# Patient Record
Sex: Female | Born: 1940 | Hispanic: No | Marital: Married | Smoking: Never smoker
Health system: Southern US, Community
[De-identification: ages and names within clinical notes are randomized; demographics above are authoritative.]

## PROBLEM LIST (undated history)

## (undated) DIAGNOSIS — M1990 Unspecified osteoarthritis, unspecified site: Secondary | ICD-10-CM

## (undated) DIAGNOSIS — A048 Other specified bacterial intestinal infections: Secondary | ICD-10-CM

## (undated) DIAGNOSIS — I10 Essential (primary) hypertension: Secondary | ICD-10-CM

## (undated) HISTORY — DX: Other specified bacterial intestinal infections: A04.8

## (undated) HISTORY — DX: Essential (primary) hypertension: I10

## (undated) HISTORY — DX: Unspecified osteoarthritis, unspecified site: M19.90

## (undated) HISTORY — PX: TUBAL LIGATION: SHX77

---

## 1999-09-23 ENCOUNTER — Other Ambulatory Visit: Admission: RE | Admit: 1999-09-23 | Discharge: 1999-09-23 | Payer: Self-pay | Admitting: *Deleted

## 2000-05-26 ENCOUNTER — Other Ambulatory Visit: Admission: RE | Admit: 2000-05-26 | Discharge: 2000-05-26 | Payer: Self-pay | Admitting: *Deleted

## 2000-05-28 ENCOUNTER — Encounter: Payer: Self-pay | Admitting: *Deleted

## 2000-05-28 ENCOUNTER — Encounter: Admission: RE | Admit: 2000-05-28 | Discharge: 2000-05-28 | Payer: Self-pay | Admitting: *Deleted

## 2004-04-04 ENCOUNTER — Other Ambulatory Visit: Admission: RE | Admit: 2004-04-04 | Discharge: 2004-04-04 | Payer: Self-pay | Admitting: Obstetrics and Gynecology

## 2004-10-18 ENCOUNTER — Ambulatory Visit: Payer: Self-pay | Admitting: Gastroenterology

## 2004-11-05 ENCOUNTER — Ambulatory Visit (HOSPITAL_COMMUNITY): Admission: RE | Admit: 2004-11-05 | Discharge: 2004-11-05 | Payer: Self-pay | Admitting: Gastroenterology

## 2004-11-14 ENCOUNTER — Ambulatory Visit: Payer: Self-pay | Admitting: Gastroenterology

## 2007-07-26 ENCOUNTER — Ambulatory Visit: Payer: Self-pay | Admitting: Family Medicine

## 2007-07-26 DIAGNOSIS — R079 Chest pain, unspecified: Secondary | ICD-10-CM | POA: Insufficient documentation

## 2007-07-26 DIAGNOSIS — N951 Menopausal and female climacteric states: Secondary | ICD-10-CM | POA: Insufficient documentation

## 2007-08-09 ENCOUNTER — Ambulatory Visit: Payer: Self-pay | Admitting: Family Medicine

## 2007-08-12 ENCOUNTER — Encounter: Payer: Self-pay | Admitting: Family Medicine

## 2007-08-12 ENCOUNTER — Encounter: Admission: RE | Admit: 2007-08-12 | Discharge: 2007-08-12 | Payer: Self-pay | Admitting: Family Medicine

## 2007-08-18 ENCOUNTER — Encounter (INDEPENDENT_AMBULATORY_CARE_PROVIDER_SITE_OTHER): Payer: Self-pay | Admitting: *Deleted

## 2007-08-20 ENCOUNTER — Encounter (INDEPENDENT_AMBULATORY_CARE_PROVIDER_SITE_OTHER): Payer: Self-pay | Admitting: *Deleted

## 2008-01-17 ENCOUNTER — Ambulatory Visit: Payer: Self-pay | Admitting: Internal Medicine

## 2008-01-17 DIAGNOSIS — T887XXA Unspecified adverse effect of drug or medicament, initial encounter: Secondary | ICD-10-CM | POA: Insufficient documentation

## 2008-01-18 ENCOUNTER — Telehealth: Payer: Self-pay | Admitting: Internal Medicine

## 2008-01-20 ENCOUNTER — Ambulatory Visit: Payer: Self-pay | Admitting: Family Medicine

## 2008-01-20 DIAGNOSIS — Z87898 Personal history of other specified conditions: Secondary | ICD-10-CM

## 2008-01-21 ENCOUNTER — Telehealth (INDEPENDENT_AMBULATORY_CARE_PROVIDER_SITE_OTHER): Payer: Self-pay | Admitting: *Deleted

## 2008-01-25 ENCOUNTER — Ambulatory Visit: Payer: Self-pay | Admitting: Family Medicine

## 2008-01-25 ENCOUNTER — Telehealth (INDEPENDENT_AMBULATORY_CARE_PROVIDER_SITE_OTHER): Payer: Self-pay | Admitting: *Deleted

## 2008-01-25 ENCOUNTER — Encounter (INDEPENDENT_AMBULATORY_CARE_PROVIDER_SITE_OTHER): Payer: Self-pay | Admitting: *Deleted

## 2008-01-25 LAB — CONVERTED CEMR LAB: OCCULT 1: NEGATIVE

## 2008-01-26 ENCOUNTER — Telehealth (INDEPENDENT_AMBULATORY_CARE_PROVIDER_SITE_OTHER): Payer: Self-pay | Admitting: *Deleted

## 2008-01-27 ENCOUNTER — Telehealth (INDEPENDENT_AMBULATORY_CARE_PROVIDER_SITE_OTHER): Payer: Self-pay | Admitting: *Deleted

## 2008-02-07 ENCOUNTER — Ambulatory Visit: Payer: Self-pay | Admitting: Family Medicine

## 2008-02-07 ENCOUNTER — Telehealth (INDEPENDENT_AMBULATORY_CARE_PROVIDER_SITE_OTHER): Payer: Self-pay | Admitting: *Deleted

## 2008-11-09 ENCOUNTER — Ambulatory Visit: Payer: Self-pay | Admitting: Family Medicine

## 2008-11-09 DIAGNOSIS — R1310 Dysphagia, unspecified: Secondary | ICD-10-CM | POA: Insufficient documentation

## 2008-11-09 DIAGNOSIS — H6690 Otitis media, unspecified, unspecified ear: Secondary | ICD-10-CM | POA: Insufficient documentation

## 2008-11-23 ENCOUNTER — Ambulatory Visit: Payer: Self-pay | Admitting: Gastroenterology

## 2008-11-23 DIAGNOSIS — M818 Other osteoporosis without current pathological fracture: Secondary | ICD-10-CM

## 2008-11-23 DIAGNOSIS — K219 Gastro-esophageal reflux disease without esophagitis: Secondary | ICD-10-CM

## 2008-12-13 ENCOUNTER — Ambulatory Visit: Payer: Self-pay | Admitting: Gastroenterology

## 2008-12-13 ENCOUNTER — Encounter: Payer: Self-pay | Admitting: Gastroenterology

## 2008-12-15 ENCOUNTER — Encounter: Payer: Self-pay | Admitting: Gastroenterology

## 2009-03-22 ENCOUNTER — Ambulatory Visit: Payer: Self-pay | Admitting: Family Medicine

## 2009-03-22 DIAGNOSIS — L255 Unspecified contact dermatitis due to plants, except food: Secondary | ICD-10-CM

## 2009-11-19 ENCOUNTER — Ambulatory Visit: Payer: Self-pay | Admitting: Family Medicine

## 2009-11-19 DIAGNOSIS — M810 Age-related osteoporosis without current pathological fracture: Secondary | ICD-10-CM | POA: Insufficient documentation

## 2009-11-19 DIAGNOSIS — G2581 Restless legs syndrome: Secondary | ICD-10-CM

## 2009-11-26 ENCOUNTER — Encounter: Admission: RE | Admit: 2009-11-26 | Discharge: 2009-11-26 | Payer: Self-pay | Admitting: Family Medicine

## 2009-11-26 ENCOUNTER — Encounter: Payer: Self-pay | Admitting: Family Medicine

## 2009-11-28 ENCOUNTER — Telehealth: Payer: Self-pay | Admitting: Family Medicine

## 2009-12-06 ENCOUNTER — Encounter (INDEPENDENT_AMBULATORY_CARE_PROVIDER_SITE_OTHER): Payer: Self-pay | Admitting: *Deleted

## 2009-12-12 ENCOUNTER — Encounter (INDEPENDENT_AMBULATORY_CARE_PROVIDER_SITE_OTHER): Payer: Self-pay | Admitting: *Deleted

## 2009-12-12 ENCOUNTER — Ambulatory Visit: Payer: Self-pay | Admitting: Family Medicine

## 2009-12-12 LAB — CONVERTED CEMR LAB
OCCULT 1: NEGATIVE
OCCULT 2: NEGATIVE

## 2010-02-21 ENCOUNTER — Ambulatory Visit: Payer: Self-pay | Admitting: Family Medicine

## 2010-03-05 ENCOUNTER — Telehealth (INDEPENDENT_AMBULATORY_CARE_PROVIDER_SITE_OTHER): Payer: Self-pay | Admitting: *Deleted

## 2010-07-17 ENCOUNTER — Ambulatory Visit: Payer: Self-pay | Admitting: Internal Medicine

## 2010-10-07 ENCOUNTER — Ambulatory Visit: Payer: Self-pay | Admitting: Family Medicine

## 2010-10-07 DIAGNOSIS — L258 Unspecified contact dermatitis due to other agents: Secondary | ICD-10-CM

## 2010-10-07 DIAGNOSIS — S139XXA Sprain of joints and ligaments of unspecified parts of neck, initial encounter: Secondary | ICD-10-CM

## 2010-12-01 LAB — CONVERTED CEMR LAB
ALT: 21 units/L (ref 0–35)
AST: 30 units/L (ref 0–37)
Albumin: 4.6 g/dL (ref 3.5–5.2)
Albumin: 4.7 g/dL (ref 3.5–5.2)
Alkaline Phosphatase: 50 units/L (ref 39–117)
Alkaline Phosphatase: 62 units/L (ref 39–117)
BUN: 14 mg/dL (ref 6–23)
Basophils Absolute: 0 10*3/uL (ref 0.0–0.1)
Basophils Relative: 0.3 % (ref 0.0–3.0)
Basophils Relative: 0.9 % (ref 0.0–1.0)
Bilirubin, Direct: 0 mg/dL (ref 0.0–0.3)
Bilirubin, Direct: 0.2 mg/dL (ref 0.0–0.3)
CO2: 28 meq/L (ref 19–32)
Calcium: 9.5 mg/dL (ref 8.4–10.5)
Chloride: 106 meq/L (ref 96–112)
Cholesterol: 214 mg/dL (ref 0–200)
Cholesterol: 224 mg/dL — ABNORMAL HIGH (ref 0–200)
Creatinine, Ser: 0.7 mg/dL (ref 0.4–1.2)
Creatinine, Ser: 0.8 mg/dL (ref 0.4–1.2)
Direct LDL: 144.1 mg/dL
GFR calc non Af Amer: 88.26 mL/min (ref >60)
Glucose, Bld: 79 mg/dL (ref 70–99)
Glucose, Bld: 89 mg/dL (ref 70–99)
HDL: 43.8 mg/dL (ref >39.0)
HDL: 45.6 mg/dL (ref >39.00)
Hemoglobin: 13.1 g/dL (ref 12.0–15.0)
Lymphocytes Relative: 30.4 % (ref 12.0–46.0)
Lymphs Abs: 1.8 10*3/uL (ref 0.7–4.0)
MCHC: 35.2 g/dL (ref 30.0–36.0)
Monocytes Absolute: 0.4 10*3/uL (ref 0.1–1.0)
Monocytes Relative: 6.9 % (ref 3.0–12.0)
Neutro Abs: 3.6 10*3/uL (ref 1.4–7.7)
Neutrophils Relative %: 61.1 % (ref 43.0–77.0)
Potassium: 3.9 meq/L (ref 3.5–5.1)
Sodium: 144 meq/L (ref 135–145)
Total Bilirubin: 1.3 mg/dL — ABNORMAL HIGH (ref 0.3–1.2)
Total CHOL/HDL Ratio: 4.9
Total Protein: 7.9 g/dL (ref 6.0–8.3)
Triglycerides: 131 mg/dL (ref 0–149)
VLDL: 32.6 mg/dL (ref 0.0–40.0)
Vit D, 25-Hydroxy: 23 ng/mL — ABNORMAL LOW (ref 30–89)
WBC: 5.9 10*3/uL (ref 4.5–10.5)

## 2010-12-05 NOTE — Progress Notes (Signed)
Summary: Bone Density Results (lmom 1/26,1/27,2/2)  Phone Note Outgoing Call   Call placed by: Army Fossa CMA,  November 28, 2009 10:34 AM Summary of Call: Regarging bone density, LMTCB:  + osteopenia---  if pt is taking calcium 1500 mg daily with vita D3 2000u daily----we need to add fosamax 70 mg weekly #4  11 refills ---recheck 2 weeks---she was on this before and stopped it.  I'm not sure why. Signed by Loreen Freud Mckeel on 11/26/2009 at 5:07 PM  Follow-up for Phone Call        [facility]. Army Fossa CMA  November 29, 2009 4:22 PM   Additional Follow-up for Phone Call Additional follow up Details #1::        LMTCB. Army Fossa CMA  December 05, 2009 1:16 PM      Appended Document: Bone Density Results (lmom 1/26,1/27,2/2) mailed pt a letter to contact the office

## 2010-12-05 NOTE — Assessment & Plan Note (Signed)
Summary: FOR NECK PAIN FOR WEEKS//PH   Vital Signs:  Patient profile:   70 year old female Height:      59.75 inches Weight:      126.6 pounds BMI:     25.02 Temp:     99.2 degrees F oral BP sitting:   130 / 78  (right arm) Cuff size:   regular  Vitals Entered By: Almeta Monas CMA Duncan Dull) (October 07, 2010 2:27 PM) CC: x1week pt had neck pain-- then she used an icy hot patch which gave her a rash on the neck   Current Medications (verified): 1)  Zyrtec Allergy 10 Mg  Tabs (Cetirizine Hcl) .... Once Daily As Needed For Allergies and Itching 2)  Tylenol 325 Mg Tabs (Acetaminophen) 3)  Caltrate 600+d Plus 600-400 Mg-Unit Tabs (Calcium Carbonate-Vit D-Min) .Marland Kitchen.. 1 By Mouth Two Times A Day 4)  Lidoderm 5 % Ptch (Lidocaine) .... Apply To Affected Area As Needed  Allergies (verified): 1)  ! Ultram Er (Tramadol Hcl)  Physical Exam  General:  Well-developed,well-nourished,in no acute distress; alert,appropriate and cooperative throughout examination Neck:  + tenderness L side neck with pain with rotation to Left + papular rash L side of neck where patch was Lungs:  Normal respiratory effort, chest expands symmetrically. Lungs are clear to auscultation, no crackles or wheezes. Heart:  normal rate and no murmur.   Psych:  Cognition and judgment appear intact. Alert and cooperative with normal attention span and concentration. No apparent delusions, illusions, hallucinations   Impression & Recommendations:  Problem # 1:  CONTACT DERMATITIS&OTH ECZEMA DUE OTH SPEC AGENT (ICD-692.89)  Her updated medication list for this problem includes:    Zyrtec Allergy 10 Mg Tabs (Cetirizine hcl) ..... Once daily as needed for allergies and itching    Elocon 0.1 % Crea (Mometasone furoate) .Marland Kitchen... Apply once daily  Discussed avoidance of triggers and symptomatic treatment.   Problem # 2:  NECK SPRAIN AND STRAIN (ICD-847.0)  Her updated medication list for this problem includes:    Tylenol 325 Mg  Tabs (Acetaminophen)    Flexeril 10 Mg Tabs (Cyclobenzaprine hcl) .Marland Kitchen... 1 by mouth three times a day as needed    Mobic 15 Mg Tabs (Meloxicam) .Marland Kitchen... 1/2 -1 by mouth once daily as needed pain  Discussed exercises and use of moist heat or cold and medication.   Complete Medication List: 1)  Zyrtec Allergy 10 Mg Tabs (Cetirizine hcl) .... Once daily as needed for allergies and itching 2)  Tylenol 325 Mg Tabs (Acetaminophen) 3)  Caltrate 600+d Plus 600-400 Mg-unit Tabs (Calcium carbonate-vit d-min) .Marland Kitchen.. 1 by mouth two times a day 4)  Lidoderm 5 % Ptch (Lidocaine) .... Apply to affected area as needed 5)  Flexeril 10 Mg Tabs (Cyclobenzaprine hcl) .Marland Kitchen.. 1 by mouth three times a day as needed 6)  Elocon 0.1 % Crea (Mometasone furoate) .... Apply once daily 7)  Mobic 15 Mg Tabs (Meloxicam) .... 1/2 -1 by mouth once daily as needed pain Prescriptions: MOBIC 15 MG TABS (MELOXICAM) 1/2 -1 by mouth once daily as needed pain  #30 x 0   Entered and Authorized by:   Loreen Freud Leavell   Signed by:   Loreen Freud Selph on 10/07/2010   Method used:   Electronically to        [id number]* (retail)       [address]       [place], [place]  [address]  Ph: [phone]       Fax: [phone]   RxID:   [id number] ELOCON 0.1 % CREA (MOMETASONE FUROATE) apply once daily  #30 g x 1   Entered and Authorized by:   Loreen Freud Weigelt   Signed by:   Loreen Freud Curington on 10/07/2010   Method used:   Electronically to        [facility] #[id number]* (retail)       [address] [place], [place]  [address]       Ph: [phone]       Fax: [phone]   RxID:   [id number] FLEXERIL 10 MG TABS (CYCLOBENZAPRINE HCL) 1 by mouth three times a day as needed  #30 x 0   Entered and Authorized by:   Loreen Freud Zalar   Signed by:   Loreen Freud Costanza on 10/07/2010   Method used:   Electronically to        [facility] #[id number]* (retail)       [address] [place],  [place]  [address]       Ph: [phone]       Fax: [phone]   RxID:   [id number]    Orders Added: 1)  Est. Patient Level III [92426]

## 2010-12-05 NOTE — Progress Notes (Signed)
Summary: Lab Results/Appt   Phone Note Outgoing Call   Call placed by: Army Fossa CMA,  Mar 05, 2010 11:09 AM Summary of Call: Regarding lab results, LMTCB:  pt will need medication--  should schedule ov to discuss results Signed by Loreen Freud Bear on 03/05/2010 at 10:42 AM  Follow-up for Phone Call        [facility]. Army Fossa CMA  Mar 06, 2010 11:39 AM   Additional Follow-up for Phone Call Additional follow up Details #1::        LMTCB.     Additional Follow-up for Phone Call Additional follow up Details #2::    Spoke with pts translater, she is going to tell Ms.Phoebe Perch CMA  Mar 07, 2010 10:13 AM

## 2010-12-05 NOTE — Assessment & Plan Note (Signed)
Summary: yearly ck.cbs  --n/s   Vital Signs:  Patient profile:   70 year old female Height:      59.75 inches Weight:      128.8 pounds BMI:     25.46 Pulse rate:   70 / minute Pulse rhythm:   regular BP sitting:   118 / 80  (left arm)  Vitals Entered By: Shary Decamp (November 19, 2009 8:31 AM) CC: CPX- no pap smear.    History of Present Illness: Pt here for cpe.  No pap.  + labs ----pt c/o burning with urination.   She also c/o restless legs at night.   No other complaints.    Preventive Screening-Counseling & Management  Alcohol-Tobacco     Alcohol drinks/day: 0     Smoking Status: never     Passive Smoke Exposure: no  Caffeine-Diet-Exercise     Caffeine use/day: < 1     Does Patient Exercise: no     Type of exercise: walk     Times/week: 4     Exercise Counseling: will start when weather warm  Hep-HIV-STD-Contraception     Dental Visit-last 6 months no      Sexual History:  currently monogamous and married.    Current Medications (verified): 1)  Aciphex 20 Mg  Tbec (Rabeprazole Sodium) .... Take 1 Each Day 30 Minutes Before Meals 2)  Zyrtec Allergy 10 Mg  Tabs (Cetirizine Hcl) .... Once Daily As Needed For Allergies and Itching 3)  Tylenol 325 Mg Tabs (Acetaminophen) 4)  Caltrate 600+d Plus 600-400 Mg-Unit Tabs (Calcium Carbonate-Vit D-Min) .Marland Kitchen.. 1 By Mouth Two Times A Day 5)  Zostavax 82956 Unt/0.62ml Solr (Zoster Vaccine Live) .Marland Kitchen.. 1 Im X1 6)  Requip 0.25 Mg Tabs (Ropinirole Hcl) .Marland Kitchen.. 1 By Mouth At Bedtime For 3 Nights Then 2 By Mouth At Bedtime  Allergies: 1)  ! Ultram Er (Tramadol Hcl)  Past History:  Past medical, surgical, family and social histories (including risk factors) reviewed for relevance to current acute and chronic problems.  Past Medical History: Reviewed history from 07/26/2007 and no changes required. Unremarkable  Past Surgical History: Reviewed history from 11/23/2008 and no changes required.  Tubal Ligation  Family  History: Reviewed history from 07/26/2007 and no changes required. Family History of Asthma: BROTHER  Social History: Reviewed history from 07/26/2007 and no changes required. Married Never Smoked Alcohol use-no Drug use-no [place]-- here 30 years- lives with husband Does Patient Exercise:  no Caffeine use/day:  < 1 Dental Care w/in 6 mos.:  no Sexual History:  currently monogamous, married  Review of Systems      See HPI General:  Denies chills, fatigue, fever, loss of appetite, malaise, sleep disorder, sweats, weakness, and weight loss. Eyes:  Denies blurring, discharge, double vision, eye irritation, eye pain, halos, itching, light sensitivity, red eye, vision loss-1 eye, and vision loss-both eyes; optho--last 02/2009. ENT:  Denies decreased hearing, difficulty swallowing, ear discharge, earache, hoarseness, nasal congestion, nosebleeds, postnasal drainage, ringing in ears, sinus pressure, and sore throat. CV:  Denies bluish discoloration of lips or nails, chest pain or discomfort, difficulty breathing at night, difficulty breathing while lying down, fainting, fatigue, leg cramps with exertion, lightheadness, near fainting, palpitations, shortness of breath with exertion, swelling of feet, swelling of hands, and weight gain. Resp:  Denies chest discomfort, chest pain with inspiration, cough, coughing up blood, excessive snoring, hypersomnolence, morning headaches, pleuritic, shortness of breath, sputum productive, and wheezing. GI:  Denies abdominal pain, bloody stools, change in  bowel habits, constipation, dark tarry stools, diarrhea, excessive appetite, gas, hemorrhoids, indigestion, and loss of appetite. GU:  Denies abnormal vaginal bleeding, decreased libido, discharge, dysuria, hematuria, incontinence, nocturia, urinary frequency, and urinary hesitancy. MS:  Denies joint pain, joint redness, joint swelling, loss of strength, low back pain, mid back pain, muscle aches, muscle , cramps,  muscle weakness, stiffness, and thoracic pain; restless legs. Derm:  Denies changes in color of skin, changes in nail beds, dryness, excessive perspiration, flushing, hair loss, insect bite(s), itching, lesion(s), poor wound healing, and rash. Neuro:  Denies brief paralysis, difficulty with concentration, disturbances in coordination, falling down, headaches, inability to speak, memory loss, numbness, poor balance, seizures, sensation of room spinning, tingling, tremors, visual disturbances, and weakness. Psych:  Denies alternate hallucination ( auditory/visual), anxiety, depression, easily angered, easily tearful, irritability, mental problems, panic attacks, sense of great danger, suicidal thoughts/plans, thoughts of violence, unusual visions or sounds, and thoughts /plans of harming others. Endo:  Denies cold intolerance, excessive hunger, excessive thirst, excessive urination, heat intolerance, polyuria, and weight change. Heme:  Denies abnormal bruising, bleeding, enlarge lymph nodes, fevers, pallor, and skin discoloration. Allergy:  Denies hives or rash, itching eyes, persistent infections, seasonal allergies, and sneezing.  Physical Exam  General:  Well-developed,well-nourished,in no acute distress; alert,appropriate and cooperative throughout examination Head:  Normocephalic and atraumatic without obvious abnormalities. No apparent alopecia or balding. Eyes:  pupils equal, pupils round, pupils reactive to light, and no injection.   Ears:  External ear exam shows no significant lesions or deformities.  Otoscopic examination reveals clear canals, tympanic membranes are intact bilaterally without bulging, retraction, inflammation or discharge. Hearing is grossly normal bilaterally. Nose:  External nasal examination shows no deformity or inflammation. Nasal mucosa are pink and moist without lesions or exudates. Mouth:  Oral mucosa and oropharynx without lesions or exudates.  Teeth in good  repair. Neck:  No deformities, masses, or tenderness noted.no carotid bruits.   Chest Wall:  No deformities, masses, or tenderness noted. Breasts:  No mass, nodules, thickening, tenderness, bulging, retraction, inflamation, nipple discharge or skin changes noted.   Lungs:  Normal respiratory effort, chest expands symmetrically. Lungs are clear to auscultation, no crackles or wheezes. Heart:  normal rate and no murmur.   Abdomen:  Bowel sounds positive,abdomen soft and non-tender without masses, organomegaly or hernias noted. Rectal:  refused Genitalia:  refused Msk:  normal ROM, no joint tenderness, no joint swelling, no joint warmth, no redness over joints, no joint deformities, no joint instability, and no crepitation.   Pulses:  R posterior tibial normal, R dorsalis pedis normal, R carotid normal, L posterior tibial normal, L dorsalis pedis normal, and L carotid normal.   Extremities:  No clubbing, cyanosis, edema, or deformity noted with normal full range of motion of all joints.   Neurologic:  No cranial nerve deficits noted. Station and gait are normal. Plantar reflexes are down-going bilaterally. DTRs are symmetrical throughout. Sensory, motor and coordinative functions appear intact. Skin:  Intact without suspicious lesions or rashes Cervical Nodes:  No lymphadenopathy noted Axillary Nodes:  No palpable lymphadenopathy Psych:  Oriented X3, normally interactive, good eye contact, not anxious appearing, and not depressed appearing.     Impression & Recommendations:  Problem # 1:  POSTMENOPAUSAL STATUS (ICD-627.2)  Orders: Venipuncture ([address]) TLB-Lipid Panel (80061-LIPID) TLB-BMP (Basic Metabolic Panel-BMET) (80048-METABOL) TLB-CBC Platelet - w/Differential (85025-CBCD) TLB-Hepatic/Liver Function Pnl (80076-HEPATIC) T-Vitamin D (25-Hydroxy) ([address]-09811) Radiology Referral (Radiology) EKG w/ Interpretation ([address])  Discussed treatment options.   Problem # 2:  GERD  (ICD-530.81) Assessment: Unchanged  Her updated medication list for this problem includes:    Aciphex 20 Mg Tbec (Rabeprazole sodium) .Marland Kitchen... Take 1 each day 30 minutes before meals  Orders: Venipuncture ([address]) TLB-Lipid Panel (80061-LIPID) TLB-BMP (Basic Metabolic Panel-BMET) (80048-METABOL) TLB-CBC Platelet - w/Differential (85025-CBCD) TLB-Hepatic/Liver Function Pnl (80076-HEPATIC) T-Vitamin D (25-Hydroxy) ([address]8119-14782) EKG w/ Interpretation ([address])  Diagnostics Reviewed:  EGD: Location: [facility]   (12/13/2008) Discussed lifestyle modifications, diet, antacids/medications, and preventive measures. Handout provided.   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 1 by mouth once weekly Her updated medication list for this problem includes:    Caltrate 600+d Plus 600-400 Mg-unit Tabs (Calcium carbonate-vit d-min) .Marland Kitchen... 1 by mouth two times a day  Bone Density: osteoporosis (09/15/2007)  Orders: EKG w/ Interpretation ([address])  Problem # 5:  RESTLESS LEG SYNDROME (ICD-333.94)  requip at bedtime  check labs  Orders: EKG w/ Interpretation ([address])  Complete Medication List: 1)  Aciphex 20 Mg Tbec (Rabeprazole sodium) .... Take 1 each day 30 minutes before meals 2)  Zyrtec Allergy 10 Mg Tabs (Cetirizine hcl) .... Once daily as needed for allergies and itching 3)  Tylenol 325 Mg Tabs (Acetaminophen) 4)  Caltrate 600+d Plus 600-400 Mg-unit Tabs (Calcium carbonate-vit d-min) .Marland Kitchen.. 1 by mouth  two times a day 5)  Zostavax 62952 Unt/0.84ml Solr (Zoster vaccine live) .Marland Kitchen.. 1 im x1 6)  Requip 0.25 Mg Tabs (Ropinirole hcl) .Marland Kitchen.. 1 by mouth at bedtime for 3 nights then 2 by mouth at bedtime  Patient Instructions: 1)  We will schedule your mammogram and bone density 2)  take calcium--1200-1500 mg a day with 1000u vita D3 Prescriptions: REQUIP 0.25 MG TABS (ROPINIROLE HCL) 1 by mouth at bedtime for 3 nights then 2 by mouth at bedtime  #60 x 0   Entered and Authorized by:   Loreen Freud Zeimet   Signed by:   Loreen Freud Wolfert on 11/19/2009   Method used:   Electronically to        [facility] #[id number]* (retail)       [address] [place], [place]  [address]       Ph: [phone]       Fax: [phone]   RxID:   [id number] ZOSTAVAX 19400 UNT/0.65ML SOLR (ZOSTER VACCINE LIVE) 1 IM x1  #1 x 0   Entered and Authorized by:   Loreen Freud Buffin   Signed by:   Loreen Freud Mcwhirter on 11/19/2009   Method used:   Print then Give to Patient   RxID:   [id number]    EKG  Procedure date:  11/19/2009  Findings:      Normal sinus rhythm with rate of:  74bpm    Bone Density Result Date:  09/15/2007 Bone Density Result:  osteoporosis Bone Density Next Due: 2 yr     Appended Document: yearly ck.cbs  --n/s Flu Vaccine Consent Questions     Cammack you have a history of severe allergic reactions to this vaccine? no    Any prior history of allergic reactions to egg and/or gelatin? no  Soileau you have a sensitivity to the preservative Thimersol? no    Hussein you have a past history of Guillan-Barre Syndrome? no    Rami you currently have an acute febrile illness? no    Have you ever had a severe reaction to latex? no    Vaccine information given and explained to patient? yes    Are you currently pregnant? no    Lot Number:[id number]   Exp Date:05/02/2010   Site Given  Left Deltoid IM    Clinical Lists Changes  Orders: Added new Service order of Admin 1st Vaccine ([address]) -  Signed Added new Service order of Flu Vaccine 30yrs + [id number]) - Signed Observations: Added new observation of FLU VAX VIS: 06/12/09 version (11/19/2009 11:17) Added new observation of FLU VAXLOT: [id number] (11/19/2009 11:17) Added new observation of FLU VAXMFR: [employer] (11/19/2009 11:17) Added new observation of FLU VAX EXP: 05/02/2010 (11/19/2009 11:17) Added new observation of FLU VAX DSE: 0.88ml (11/19/2009 11:17) Added new observation of FLU VAX: Fluvax 3+ (11/19/2009 11:17)      Appended Document: yearly ck.cbs  --n/s  Laboratory Results   Urine Tests   Date/Time Reported: November 19, 2009 11:27 AM   Routine Urinalysis   Color: yellow Appearance: Clear Glucose: negative   (Normal Range: Negative) Bilirubin: negative   (Normal Range: Negative) Ketone: negative   (Normal Range: Negative) Spec. Gravity: <1.005   (Normal Range: 1.003-1.035) Blood: negative   (Normal Range: Negative) pH: 6.5   (Normal Range: 5.0-8.0) Protein: negative   (Normal Range: Negative) Urobilinogen: negative   (Normal Range: 0-1) Nitrite: negative   (Normal Range: Negative) Leukocyte Esterace: negative   (Normal Range: Negative)    Comments: Floydene Flock  November 19, 2009 11:27 AM

## 2010-12-05 NOTE — Letter (Signed)
Summary: Results Follow up Letter  [facility]  [address] [place], [place] [address]   Phone: [phone]  Fax: [phone]    12/12/2009 MRN: [id number]  Ariel Williams [address] [place], [place]  [address]  Dear [name],  The following are the results of your recent test(s):  Test         Result    Pap Smear:        Normal _____  Not Normal _____ Comments: ______________________________________________________ Cholesterol: LDL(Bad cholesterol):         Your goal is less than:         HDL (Good cholesterol):       Your goal is more than: Comments:  ______________________________________________________ Mammogram:        Normal _____  Not Normal _____ Comments:  ___________________________________________________________________ Hemoccult:        Normal __X___  Not normal _______ Comments:    _____________________________________________________________________ Other Tests:    We routinely [place] not discuss normal results over the telephone.  If you desire a copy of the results, or you have any questions about this information we can discuss them at your next office visit.   Sincerely,    Army Fossa CMA  December 12, 2009 1:48 PM

## 2010-12-05 NOTE — Letter (Signed)
Summary: Unable To Reach-Consult Scheduled  [facility]  [address] [place], [place] [address]   Phone: [phone]  Fax: [phone]    12/06/2009 MRN: [id number]    Dear Ms. Maker,   We have been unable to reach you by phone.  Please contact our office with an updated phone number.     Thank you,  Army Fossa CMA  December 06, 2009 1:52 PM

## 2010-12-05 NOTE — Assessment & Plan Note (Signed)
Summary: FLU SHOT///SPH  Nurse Visit   Allergies: 1)  ! Ultram Er (Tramadol Hcl)  Orders Added: 1)  Flu Vaccine 75yrs + MEDICARE PATIENTS [Q2039] 2)  Administration Flu vaccine - MCR [G0008] Flu Vaccine Consent Questions     Yamashiro you have a history of severe allergic reactions to this vaccine? no    Any prior history of allergic reactions to egg and/or gelatin? no    Soutar you have a sensitivity to the preservative Thimersol? no    Tenpenny you have a past history of Guillan-Barre Syndrome? no    Caselli you currently have an acute febrile illness? no    Have you ever had a severe reaction to latex? no    Vaccine information given and explained to patient? yes    Are you currently pregnant? no    Lot Number:[id number]   Exp Date:05/03/2011   Site Given  Right  Deltoid IM

## 2010-12-10 ENCOUNTER — Encounter: Payer: Self-pay | Admitting: Family Medicine

## 2010-12-10 ENCOUNTER — Ambulatory Visit (INDEPENDENT_AMBULATORY_CARE_PROVIDER_SITE_OTHER): Payer: MEDICARE | Admitting: Family Medicine

## 2010-12-10 DIAGNOSIS — J019 Acute sinusitis, unspecified: Secondary | ICD-10-CM | POA: Insufficient documentation

## 2010-12-19 NOTE — Assessment & Plan Note (Signed)
Summary: cough/cbs - vietnamese inteprter scheduled   Vital Signs:  Patient profile:   70 year old female Weight:      128.8 pounds O2 Sat:      98 % on Room air Temp:     98.0 degrees F oral Pulse rate:   69 / minute Pulse rhythm:   regular BP sitting:   120 / 70  (right arm) Cuff size:   regular  Vitals Entered By: Almeta Monas CMA Duncan Dull) (December 10, 2010 2:51 PM)  O2 Flow:  Room air CC: x3days c/o sinus pressure, congestion, sore throat, cough with dark phelgm, URI symptoms   History of Present Illness:       This is a 70 year old woman who presents with URI symptoms.  + b/l sinus pressure and headache.  The patient complains of nasal congestion, purulent nasal discharge, sore throat, productive cough, and sick contacts, but denies clear nasal discharge and earache.  The patient denies fever, low-grade fever (<100.5 degrees), fever of 100.5-103 degrees, fever of 103.1-104 degrees, fever to >104 degrees, stiff neck, dyspnea, wheezing, rash, vomiting, diarrhea, use of an antipyretic, and response to antipyretic.  The patient also reports headache.  The patient denies the following risk factors for Strep sinusitis: unilateral facial pain, unilateral nasal discharge, poor response to decongestant, double sickening, tooth pain, Strep exposure, tender adenopathy, and absence of cough.    Current Medications (verified): 1)  Zyrtec Allergy 10 Mg  Tabs (Cetirizine Hcl) .... Once Daily As Needed For Allergies and Itching 2)  Tylenol 325 Mg Tabs (Acetaminophen) 3)  Caltrate 600+d Plus 600-400 Mg-Unit Tabs (Calcium Carbonate-Vit D-Min) .Marland Kitchen.. 1 By Mouth Two Times A Day 4)  Lidoderm 5 % Ptch (Lidocaine) .... Apply To Affected Area As Needed 5)  Flexeril 10 Mg Tabs (Cyclobenzaprine Hcl) .Marland Kitchen.. 1 By Mouth Three Times A Day As Needed 6)  Mobic 15 Mg Tabs (Meloxicam) .... 1/2 -1 By Mouth Once Daily As Needed Pain 7)  Sudafed Pe Sinus/allergy 4-10 Mg Tabs (Chlorpheniramine-Phenylephrine) .... As  Directed 8)  Augmentin 875-125 Mg Tabs (Amoxicillin-Pot Clavulanate) .Marland Kitchen.. 1 By Mouth Two Times A Day 9)  Nasonex 50 Mcg/act Susp (Mometasone Furoate) .... 2 Sprays Each Nostril Once Daily  Allergies (verified): 1)  ! Ultram Er (Tramadol Hcl)  Past History:  Past Medical History: Last updated: 07/26/2007 Unremarkable  Past Surgical History: Last updated: 11/23/2008  Tubal Ligation  Family History: Last updated: 07/26/2007 Family History of Asthma: BROTHER  Social History: Last updated: 07/26/2007 Married Never Smoked Alcohol use-no Drug use-no [place]-- here 30 years- lives with husband  Risk Factors: Alcohol Use: 0 (11/19/2009) Caffeine Use: < 1 (11/19/2009) Exercise: no (11/19/2009)  Risk Factors: Smoking Status: never (11/19/2009) Passive Smoke Exposure: no (11/19/2009)  Review of Systems      See HPI  Physical Exam  General:  Well-developed,well-nourished,in no acute distress; alert,appropriate and cooperative throughout examination Ears:  External ear exam shows no significant lesions or deformities.  Otoscopic examination reveals clear canals, tympanic membranes are intact bilaterally without bulging, retraction, inflammation or discharge. Hearing is grossly normal bilaterally. Nose:  no external deformity, L frontal sinus tenderness, L maxillary sinus tenderness, R frontal sinus tenderness, and R maxillary sinus tenderness.   Mouth:  pharyngeal erythema and postnasal drip.   Neck:  No deformities, masses, or tenderness noted. Lungs:  Normal respiratory effort, chest expands symmetrically. Lungs are clear to auscultation, no crackles or wheezes. Heart:  normal rate and no murmur.   Extremities:  No clubbing, cyanosis, edema, or deformity noted with normal full range of motion of all joints.   Skin:  Intact without suspicious lesions or rashes Cervical Nodes:  No lymphadenopathy noted Psych:  Cognition and judgment appear intact. Alert and cooperative with  normal attention span and concentration. No apparent delusions, illusions, hallucinations   Impression & Recommendations:  Problem # 1:  SINUSITIS - ACUTE-NOS (ICD-461.9)  Her updated medication list for this problem includes:    Sudafed Pe Sinus/allergy 4-10 Mg Tabs (Chlorpheniramine-phenylephrine) .Marland Kitchen... As directed    Augmentin 875-125 Mg Tabs (Amoxicillin-pot clavulanate) .Marland Kitchen... 1 by mouth two times a day    Nasonex 50 Mcg/act Susp (Mometasone furoate) .Marland Kitchen... 2 sprays each nostril once daily  Instructed on treatment. Call if symptoms persist or worsen.   Orders: Prescription Created Electronically [phone])  Complete Medication List: 1)  Zyrtec Allergy 10 Mg Tabs (Cetirizine hcl) .... Once daily as needed for allergies and itching 2)  Tylenol 325 Mg Tabs (Acetaminophen) 3)  Caltrate 600+d Plus 600-400 Mg-unit Tabs (Calcium carbonate-vit d-min) .Marland Kitchen.. 1 by mouth two times a day 4)  Lidoderm 5 % Ptch (Lidocaine) .... Apply to affected area as needed 5)  Flexeril 10 Mg Tabs (Cyclobenzaprine hcl) .Marland Kitchen.. 1 by mouth three times a day as needed 6)  Mobic 15 Mg Tabs (Meloxicam) .... 1/2 -1 by mouth once daily as needed pain 7)  Sudafed Pe Sinus/allergy 4-10 Mg Tabs (Chlorpheniramine-phenylephrine) .... As directed 8)  Augmentin 875-125 Mg Tabs (Amoxicillin-pot clavulanate) .Marland Kitchen.. 1 by mouth two times a day 9)  Nasonex 50 Mcg/act Susp (Mometasone furoate) .... 2 sprays each nostril once daily  Patient Instructions: 1)  Acute sinusitis symptoms for less than 10 days are not helped by antibiotics. Use warm moist compresses, and over the counter decongestants( only as directed). Call if no improvement in 5-7 days, sooner if increasing pain, fever, or new symptoms.  Prescriptions: NASONEX 50 MCG/ACT SUSP (MOMETASONE FUROATE) 2 sprays each nostril once daily  #1 x 0   Entered and Authorized by:   Loreen Freud Papesh   Signed by:   Loreen Freud Duling on 12/10/2010   Method used:   Historical   RxID:    [id number] AUGMENTIN 875-125 MG TABS (AMOXICILLIN-POT CLAVULANATE) 1 by mouth two times a day  #20 x 0   Entered and Authorized by:   Loreen Freud Coull   Signed by:   Loreen Freud Beckstead on 12/10/2010   Method used:   Electronically to        [facility] [address] [id number] (retail)       [address] [place], [place]  [address]       Ph: [phone]       Fax: [phone]   RxID:   [id number]    Orders Added: 1)  Est. Patient Level III [57322] 2)  Prescription Created Electronically [phone]

## 2011-07-03 ENCOUNTER — Ambulatory Visit: Payer: Medicare Other | Admitting: Gastroenterology

## 2011-07-22 ENCOUNTER — Encounter: Payer: Medicare Other | Admitting: Family Medicine

## 2011-08-01 ENCOUNTER — Ambulatory Visit: Payer: Medicare Other | Admitting: Gastroenterology

## 2011-09-08 ENCOUNTER — Encounter: Payer: Self-pay | Admitting: Family Medicine

## 2011-09-09 ENCOUNTER — Ambulatory Visit (INDEPENDENT_AMBULATORY_CARE_PROVIDER_SITE_OTHER): Payer: Medicare Other | Admitting: Family Medicine

## 2011-09-09 ENCOUNTER — Encounter: Payer: Self-pay | Admitting: Family Medicine

## 2011-09-09 VITALS — BP 118/74 | HR 73 | Temp 97.8°F | Ht 59.5 in | Wt 127.8 lb

## 2011-09-09 DIAGNOSIS — Z0000 Encounter for general adult medical examination without abnormal findings: Secondary | ICD-10-CM

## 2011-09-09 DIAGNOSIS — M549 Dorsalgia, unspecified: Secondary | ICD-10-CM

## 2011-09-09 MED ORDER — MELOXICAM 15 MG PO TABS: 15.0000 mg | ORAL_TABLET | Freq: Every day | ORAL | Status: DC

## 2011-09-09 MED ORDER — LIDOCAINE 5 % EX PTCH: 1.0000 | MEDICATED_PATCH | CUTANEOUS | Status: DC

## 2011-09-09 NOTE — Patient Instructions (Signed)

## 2011-09-09 NOTE — Progress Notes (Signed)
Subjective:    Ariel Williams is a 70 y.o. female who presents for Medicare Annual/Subsequent preventive examination.  Preventive Screening-Counseling & Management  Tobacco History  Smoking status  . Never Smoker   Smokeless tobacco  . Not on file     Problems Prior to Visit 1.   Current Problems (verified) Patient Active Problem List  Diagnoses  . RESTLESS LEG SYNDROME  . UNSPECIFIED OTITIS MEDIA  . GERD  . POSTMENOPAUSAL STATUS  . CONTACT DERMATITIS&OTHER ECZEMA DUE TO PLANTS  . CONTACT DERMATITIS&OTH ECZEMA DUE OTH SPEC AGENT  . SENILE OSTEOPOROSIS  . DISUSE OSTEOPOROSIS  . Unspecified Chest Pain  . DYSPHAGIA UNSPECIFIED  . NECK SPRAIN AND STRAIN  . UNS ADVRS EFF UNS RX MEDICINAL&BIOLOGICAL SBSTNC  . SHINGLES, HX OF  . SINUSITIS - ACUTE-NOS    Medications Prior to Visit Current Outpatient Prescriptions on File Prior to Visit  Medication Sig Dispense Refill  . acetaminophen (TYLENOL) 325 MG tablet Take 650 mg by mouth every 6 (six) hours as needed.        . cetirizine (ZYRTEC) 10 MG tablet Take 10 mg by mouth daily.        . mometasone (NASONEX) 50 MCG/ACT nasal spray Place 2 sprays into the nose daily.          Current Medications (verified) Current Outpatient Prescriptions  Medication Sig Dispense Refill  . acetaminophen (TYLENOL) 325 MG tablet Take 650 mg by mouth every 6 (six) hours as needed.        . cetirizine (ZYRTEC) 10 MG tablet Take 10 mg by mouth daily.        Marland Kitchen lidocaine (LIDODERM) 5 % Place 1 patch onto the skin daily. Remove & Discard patch within 12 hours or as directed by MD  30 patch  1  . meloxicam (MOBIC) 15 MG tablet Take 1 tablet (15 mg total) by mouth daily. Take 1/2-1 by mouth prn for pain  30 tablet  1  . mometasone (NASONEX) 50 MCG/ACT nasal spray Place 2 sprays into the nose daily.           Allergies (verified) Tramadol hcl   PAST HISTORY  Family History Family History  Problem Relation Age of Onset  . Asthma Brother     Social  History History  Substance Use Topics  . Smoking status: Never Smoker   . Smokeless tobacco: Not on file  . Alcohol Use: No     Are there smokers in your home (other than you)? Yes---son  Risk Factors Current exercise habits: walking   Dietary issues discussed: none Cardiac risk factors: advanced age (older than 78 for men, 3 for women) and sedentary lifestyle.  Depression Screen (Note: if answer to either of the following is "Yes", a more complete depression screening is indicated)   Over the past two weeks, have you felt down, depressed or hopeless? Yes  Over the past two weeks, have you felt little interest or pleasure in doing things? Yes  Have you lost interest or pleasure in daily life? Yes  Vannice you often feel hopeless? No  Donson you cry easily over simple problems? Yes  Pt does not want treatment for depression  Activities of Daily Living In your present state of health, Harnisch you have any difficulty performing the following activities?:  Driving? Yes Managing money?  No Feeding yourself? No Getting from bed to chair? No Climbing a flight of stairs? No Preparing food and eating?: No Bathing or showering? No Getting dressed: No  Getting to the toilet? No Using the toilet:No Moving around from place to place: No In the past year have you fallen or had a near fall?:No   Are you sexually active?  Yes  Grabski you have more than one partner?  No  Hearing Difficulties: No Gaiser you often ask people to speak up or repeat themselves? No Cordon you experience ringing or noises in your ears? No Freda you have difficulty understanding soft or whispered voices? No   Avetisyan you feel that you have a problem with memory? No  Pawling you often misplace items? No  Moncur you feel safe at home?  No  Cognitive Testing  Alert? Yes  Normal Appearance?Yes  Oriented to person? Yes  Place? Yes   Time? Yes  Recall of three objects?  Yes  Can perform simple calculations? Yes  Displays appropriate  judgment?Yes  Can read the correct time from a watch face?Yes   Advanced Directives have been discussed with the patient? Yes  List the Names of Other Physician/Practitioners you currently use: 1.  na  Indicate any recent Medical Services you may have received from other than [facility] providers in the past year (date may be approximate).  Immunization History  Administered Date(s) Administered  . Influenza Whole 11/19/2009, 07/17/2010, 07/19/2011  . Pneumococcal Polysaccharide 07/26/2007    Screening Tests Health Maintenance  Topic Date Due  . Tetanus/tdap  03/12/1960  . Colonoscopy  03/13/1991  . Zostavax  03/12/2001  . Influenza Vaccine  08/03/2012  . Pneumococcal Polysaccharide Vaccine Age 69 And Over  Completed    All answers were reviewed with the patient and necessary referrals were made:  Loreen Freud, [place]   09/09/2011   History reviewed: allergies, current medications, past family history, past medical history, past social history, past surgical history and problem list  Review of Systems Review of Systems  Constitutional: Negative for activity change, appetite change and fatigue.  HENT: Negative for hearing loss, congestion, tinnitus and ear discharge.  dentist q73m Eyes: Negative for visual disturbance (see optho q1y -- vision corrected to 20/20 with glasses).  Respiratory: Negative for cough, chest tightness and shortness of breath.   Cardiovascular: Negative for chest pain, palpitations and leg swelling.  Gastrointestinal: Negative for abdominal pain, diarrhea, constipation and abdominal distention.  Genitourinary: Negative for urgency, frequency, decreased urine volume and difficulty urinating.  Musculoskeletal:  + back pain,  No gait problem.  Skin: Negative for color change, pallor and rash.  Neurological: Negative for dizziness, light-headedness, numbness and headaches.  Hematological: Negative for adenopathy. Does not bruise/bleed easily.  Psychiatric/Behavioral:  Negative for suicidal ideas, confusion, sleep disturbance, self-injury, dysphoric mood, decreased concentration and agitation.      Objective:     Vision by Snellen chart: opth  Body mass index is 25.38 kg/(m^2). BP 118/74  Pulse 73  Temp(Src) 97.8 F (36.6 C) (Oral)  Ht 4' 11.5" (1.511 m)  Wt 127 lb 12.8 oz (57.97 kg)  BMI 25.38 kg/m2  SpO2 98%  BP 118/74  Pulse 73  Temp(Src) 97.8 F (36.6 C) (Oral)  Ht 4' 11.5" (1.511 m)  Wt 127 lb 12.8 oz (57.97 kg)  BMI 25.38 kg/m2  SpO2 98% General appearance: alert, cooperative, appears stated age and no distress Head: Normocephalic, without obvious abnormality, atraumatic Eyes: conjunctivae/corneas clear. PERRL, EOM's intact. Fundi benign. Ears: normal TM's and external ear canals both ears Nose: Nares normal. Septum midline. Mucosa normal. No drainage or sinus tenderness. Throat: lips, mucosa, and tongue normal; teeth and gums  normal Neck: no adenopathy, no carotid bruit, no JVD, supple, symmetrical, trachea midline and thyroid not enlarged, symmetric, no tenderness/mass/nodules Back: symmetric, no curvature. ROM normal. No CVA tenderness. Lungs: clear to auscultation bilaterally Breasts: normal appearance, no masses or tenderness Heart: regular rate and rhythm, S1, S2 normal, no murmur, click, rub or gallop Abdomen: soft, non-tender; bowel sounds normal; no masses,  no organomegaly Pelvic: not indicated; post-menopausal, no abnormal Pap smears in past Extremities: extremities normal, atraumatic, no cyanosis or edema Pulses: 2+ and symmetric Skin: Skin color, texture, turgor normal. No rashes or lesions Lymph nodes: Cervical, supraclavicular, and axillary nodes normal. Neurologic: Alert and oriented X 3, normal strength and tone. Normal symmetric reflexes. Normal coordination and gait Psych--mild depression---pt refusing meds     Assessment:     cpe     Plan:     During the course of the visit the patient was educated  and counseled about appropriate screening and preventive services including:    Pneumococcal vaccine   Influenza vaccine  Td vaccine  Screening mammography  Screening Pap smear and pelvic exam   Bone densitometry screening  Colorectal cancer screening  Diabetes screening  Advanced directives: has NO advanced directive - not interested in additional information  Diet review for nutrition referral? Yes ____  Not Indicated _x___   Patient Instructions (the written plan) was given to the patient.  Medicare Attestation I have personally reviewed: The patient's medical and social history Their use of alcohol, tobacco or illicit drugs Their current medications and supplements The patient's functional ability including ADLs,fall risks, home safety risks, cognitive, and hearing and visual impairment Diet and physical activities Evidence for depression or mood disorders  The patient's weight, height, BMI, and visual acuity have been recorded in the chart.  I have made referrals, counseling, and provided education to the patient based on review of the above and I have provided the patient with a written personalized care plan for preventive services.     Loreen Freud, [place]   09/09/2011

## 2011-09-10 ENCOUNTER — Other Ambulatory Visit: Payer: Self-pay | Admitting: Family Medicine

## 2011-09-10 DIAGNOSIS — Z0000 Encounter for general adult medical examination without abnormal findings: Secondary | ICD-10-CM

## 2011-09-11 ENCOUNTER — Other Ambulatory Visit (INDEPENDENT_AMBULATORY_CARE_PROVIDER_SITE_OTHER): Payer: Medicare Other

## 2011-09-11 DIAGNOSIS — Z79899 Other long term (current) drug therapy: Secondary | ICD-10-CM

## 2011-09-11 DIAGNOSIS — Z0000 Encounter for general adult medical examination without abnormal findings: Secondary | ICD-10-CM

## 2011-09-11 LAB — CBC WITH DIFFERENTIAL/PLATELET
Eosinophils Absolute: 0.1 10*3/uL (ref 0.0–0.7)
Eosinophils Relative: 1.5 % (ref 0.0–5.0)
HCT: 40.1 % (ref 36.0–46.0)
Hemoglobin: 13.5 g/dL (ref 12.0–15.0)
Lymphocytes Relative: 35.3 % (ref 12.0–46.0)
Lymphs Abs: 1.7 10*3/uL (ref 0.7–4.0)
Monocytes Absolute: 0.3 10*3/uL (ref 0.1–1.0)
Monocytes Relative: 5.3 % (ref 3.0–12.0)
Neutro Abs: 2.8 10*3/uL (ref 1.4–7.7)
Platelets: 231 10*3/uL (ref 150.0–400.0)
RDW: 12.9 % (ref 11.5–14.6)
WBC: 4.9 10*3/uL (ref 4.5–10.5)

## 2011-09-11 LAB — BASIC METABOLIC PANEL
Calcium: 9.4 mg/dL (ref 8.4–10.5)
Chloride: 106 mEq/L (ref 96–112)
Creatinine, Ser: 0.7 mg/dL (ref 0.4–1.2)
GFR: 89.27 mL/min (ref >60.00)
Glucose, Bld: 97 mg/dL (ref 70–99)

## 2011-09-11 LAB — LIPID PANEL: VLDL: 28.6 mg/dL (ref 0.0–40.0)

## 2011-09-11 LAB — HEPATIC FUNCTION PANEL
AST: 25 U/L (ref 0–37)
Alkaline Phosphatase: 71 U/L (ref 39–117)
Bilirubin, Direct: 0 mg/dL (ref 0.0–0.3)
Total Protein: 7.8 g/dL (ref 6.0–8.3)

## 2011-09-11 LAB — LDL CHOLESTEROL, DIRECT: Direct LDL: 137.6 mg/dL

## 2011-09-11 NOTE — Progress Notes (Signed)
12  

## 2012-08-05 ENCOUNTER — Ambulatory Visit (INDEPENDENT_AMBULATORY_CARE_PROVIDER_SITE_OTHER): Payer: Medicare Other | Admitting: *Deleted

## 2012-08-05 DIAGNOSIS — Z23 Encounter for immunization: Secondary | ICD-10-CM

## 2013-03-02 ENCOUNTER — Encounter: Payer: Self-pay | Admitting: Family Medicine

## 2013-03-02 ENCOUNTER — Ambulatory Visit (INDEPENDENT_AMBULATORY_CARE_PROVIDER_SITE_OTHER): Payer: Medicare Other | Admitting: Family Medicine

## 2013-03-02 VITALS — BP 120/76 | HR 73 | Temp 97.9°F | Ht 59.75 in | Wt 128.0 lb

## 2013-03-02 DIAGNOSIS — Z1231 Encounter for screening mammogram for malignant neoplasm of breast: Secondary | ICD-10-CM

## 2013-03-02 DIAGNOSIS — Z0000 Encounter for general adult medical examination without abnormal findings: Secondary | ICD-10-CM

## 2013-03-02 DIAGNOSIS — Z136 Encounter for screening for cardiovascular disorders: Secondary | ICD-10-CM

## 2013-03-02 DIAGNOSIS — M549 Dorsalgia, unspecified: Secondary | ICD-10-CM

## 2013-03-02 DIAGNOSIS — M810 Age-related osteoporosis without current pathological fracture: Secondary | ICD-10-CM

## 2013-03-02 DIAGNOSIS — Z1239 Encounter for other screening for malignant neoplasm of breast: Secondary | ICD-10-CM

## 2013-03-02 LAB — BASIC METABOLIC PANEL
BUN: 9 mg/dL (ref 6–23)
Chloride: 105 mEq/L (ref 96–112)
Glucose, Bld: 91 mg/dL (ref 70–99)
Potassium: 3.5 mEq/L (ref 3.5–5.1)

## 2013-03-02 LAB — LDL CHOLESTEROL, DIRECT: Direct LDL: 140 mg/dL

## 2013-03-02 LAB — HEPATIC FUNCTION PANEL
ALT: 17 U/L (ref 0–35)
AST: 25 U/L (ref 0–37)
Albumin: 4.7 g/dL (ref 3.5–5.2)
Alkaline Phosphatase: 76 U/L (ref 39–117)
Bilirubin, Direct: 0 mg/dL (ref 0.0–0.3)
Total Bilirubin: 0.9 mg/dL (ref 0.3–1.2)
Total Protein: 8.1 g/dL (ref 6.0–8.3)

## 2013-03-02 LAB — CBC WITH DIFFERENTIAL/PLATELET
Basophils Relative: 0.3 % (ref 0.0–3.0)
HCT: 40.4 % (ref 36.0–46.0)
Lymphs Abs: 1.6 10*3/uL (ref 0.7–4.0)
MCHC: 34 g/dL (ref 30.0–36.0)
MCV: 90 fl (ref 78.0–100.0)
Monocytes Relative: 5 % (ref 3.0–12.0)
Neutro Abs: 3.7 10*3/uL (ref 1.4–7.7)
Neutrophils Relative %: 64.8 % (ref 43.0–77.0)
Platelets: 240 10*3/uL (ref 150.0–400.0)
RBC: 4.49 Mil/uL (ref 3.87–5.11)
RDW: 12.6 % (ref 11.5–14.6)
WBC: 5.6 10*3/uL (ref 4.5–10.5)

## 2013-03-02 LAB — LIPID PANEL
Cholesterol: 208 mg/dL — ABNORMAL HIGH (ref 0–200)
HDL: 53.7 mg/dL (ref >39.00)
VLDL: 25.8 mg/dL (ref 0.0–40.0)

## 2013-03-02 MED ORDER — LIDOCAINE 5 % EX PTCH: 1.0000 | MEDICATED_PATCH | CUTANEOUS | Status: DC

## 2013-03-02 NOTE — Progress Notes (Signed)
Subjective:    Ariel Williams is a 72 y.o. female who presents for Medicare Annual/Subsequent preventive examination. Translator is present.  Preventive Screening-Counseling & Management  Tobacco History  Smoking status  . Never Smoker   Smokeless tobacco  . Not on file     Problems Prior to Visit 1.   Current Problems (verified) Patient Active Problem List   Diagnosis Date Noted  . SINUSITIS - ACUTE-NOS 12/10/2010  . CONTACT DERMATITIS&OTH ECZEMA DUE OTH SPEC AGENT 10/07/2010  . NECK SPRAIN AND STRAIN 10/07/2010  . RESTLESS LEG SYNDROME 11/19/2009  . 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 GERD 11/23/2008  . DISUSE OSTEOPOROSIS 11/23/2008  . UNSPECIFIED OTITIS MEDIA 11/09/2008  . DYSPHAGIA UNSPECIFIED 11/09/2008  . SHINGLES, HX OF 01/20/2008  . UNS ADVRS EFF UNS RX MEDICINAL&BIOLOGICAL SBSTNC 01/17/2008  . POSTMENOPAUSAL STATUS 07/26/2007  . Unspecified Chest Pain 07/26/2007    Medications Prior to Visit Current Outpatient Prescriptions on File Prior to Visit  Medication Sig Dispense Refill  . acetaminophen (TYLENOL) 325 MG tablet Take 650 mg by mouth every 6 (six) hours as needed.        . cetirizine (ZYRTEC) 10 MG tablet Take 10 mg by mouth daily.        . mometasone (NASONEX) 50 MCG/ACT nasal spray Place 2 sprays into the nose daily.         No current facility-administered medications on file prior to visit.    Current Medications (verified) Current Outpatient Prescriptions  Medication Sig Dispense Refill  . acetaminophen (TYLENOL) 325 MG tablet Take 650 mg by mouth every 6 (six) hours as needed.        . cetirizine (ZYRTEC) 10 MG tablet Take 10 mg by mouth daily.        Marland Kitchen lidocaine (LIDODERM) 5 % Place 1 patch onto the skin daily. Remove & Discard patch within 12 hours or as directed by MD  30 patch  1  . mometasone (NASONEX) 50 MCG/ACT nasal spray Place 2 sprays into the nose daily.         No current  facility-administered medications for this visit.     Allergies (verified) Tramadol hcl   PAST HISTORY  Family History Family History  Problem Relation Age of Onset  . Asthma Brother     Social History History  Substance Use Topics  . Smoking status: Never Smoker   . Smokeless tobacco: Not on file  . Alcohol Use: No     Are there smokers in your home (other than you)? No  Risk Factors Current exercise habits: walking  Dietary issues discussed: na   Cardiac risk factors: advanced age (older than 5 for men, 25 for women).  Depression Screen (Note: if answer to either of the following is "Yes", a more complete depression screening is indicated)   Over the past two weeks, have you felt down, depressed or hopeless? No  Over the past two weeks, have you felt little interest or pleasure in doing things? No  Have you lost interest or pleasure in daily life? No  Rocha you often feel hopeless? No  Grim you cry easily over simple problems? No  Activities of Daily Living In your present state of health, Bigos you have any difficulty performing the following activities?:  Driving? No Managing money?  No Feeding yourself? No Getting from bed to chair? No Climbing a flight of stairs? No Preparing food and eating?: No Bathing or showering? No Getting  dressed: No Getting to the toilet? No Using the toilet:No Moving around from place to place: No In the past year have you fallen or had a near fall?:No   Are you sexually active?  No  Lauver you have more than one partner?  No  Hearing Difficulties: No Hitz you often ask people to speak up or repeat themselves? No Thaxton you experience ringing or noises in your ears? No Sarafian you have difficulty understanding soft or whispered voices? No   Rundquist you feel that you have a problem with memory? No  Tenpas you often misplace items? No  Soberanis you feel safe at home?  Yes  Cognitive Testing  Alert? Yes  Normal Appearance?Yes  Oriented to person? Yes  Place?  Yes   Time? Yes  Recall of three objects?  Yes  Can perform simple calculations? Yes  Displays appropriate judgment?Yes  Can read the correct time from a watch face?Yes   Advanced Directives have been discussed with the patient? Yes  List the Names of Other Physician/Practitioners you currently use: 1.  Eye-- cant remember name   Indicate any recent Medical Services you may have received from other than [facility] providers in the past year (date may be approximate).  Immunization History  Administered Date(s) Administered  . Influenza Split 08/05/2012  . Influenza Whole 11/19/2009, 07/17/2010, 07/19/2011  . Pneumococcal Polysaccharide 07/26/2007    Screening Tests Health Maintenance  Topic Date Due  . Tetanus/tdap  03/12/1960  . Zostavax  03/12/2001  . Mammogram  11/26/2010  . Influenza Vaccine  07/04/2013  . Colonoscopy  09/09/2019  . Pneumococcal Polysaccharide Vaccine Age 47 And Over  Completed    All answers were reviewed with the patient and necessary referrals were made:  Loreen Freud, [place]   03/02/2013   History reviewed:  She  has a past medical history of Arthritis. She  does not have any pertinent problems on file. She  has past surgical history that includes Tubal ligation and Cesarean section. Her family history includes Asthma in her brother. She  reports that she has never smoked. She does not have any smokeless tobacco history on file. She reports that she does not drink alcohol or use illicit drugs. She has a current medication list which includes the following prescription(s): acetaminophen, cetirizine, lidocaine, and mometasone. Current Outpatient Prescriptions on File Prior to Visit  Medication Sig Dispense Refill  . acetaminophen (TYLENOL) 325 MG tablet Take 650 mg by mouth every 6 (six) hours as needed.        . cetirizine (ZYRTEC) 10 MG tablet Take 10 mg by mouth daily.        . mometasone (NASONEX) 50 MCG/ACT nasal spray Place 2 sprays into the nose  daily.         No current facility-administered medications on file prior to visit.   She is allergic to tramadol hcl.  Review of Systems  Review of Systems  Constitutional: Negative for activity change, appetite change and fatigue.  HENT: Negative for hearing loss, congestion, tinnitus and ear discharge.   Eyes: Negative for visual disturbance (see optho q1y -- vision corrected to 20/20 with glasses).  Respiratory: Negative for cough, chest tightness and shortness of breath.   Cardiovascular: Negative for chest pain, palpitations and leg swelling.  Gastrointestinal: Negative for abdominal pain, diarrhea, constipation and abdominal distention.  Genitourinary: Negative for urgency, frequency, decreased urine volume and difficulty urinating.  Musculoskeletal: Negative for back pain, arthralgias and gait problem.  Skin: Negative for color  change, pallor and rash.  Neurological: Negative for dizziness, light-headedness, numbness and headaches.  Hematological: Negative for adenopathy. Does not bruise/bleed easily.  Psychiatric/Behavioral: Negative for suicidal ideas, confusion, sleep disturbance, self-injury, dysphoric mood, decreased concentration and agitation.  Pt is able to read and write and can Cai all ADLs No risk for falling No abuse/ violence in home      Objective:     Vision by Snellen chart: opth  Body mass index is 25.2 kg/(m^2). BP 120/76  Pulse 73  Temp(Src) 97.9 F (36.6 C) (Oral)  Ht 4' 11.75" (1.518 m)  Wt 128 lb (58.06 kg)  BMI 25.2 kg/m2  SpO2 98%  BP 120/76  Pulse 73  Temp(Src) 97.9 F (36.6 C) (Oral)  Ht 4' 11.75" (1.518 m)  Wt 128 lb (58.06 kg)  BMI 25.2 kg/m2  SpO2 98% General appearance: alert, cooperative, appears stated age and no distress Head: Normocephalic, without obvious abnormality, atraumatic Eyes: conjunctivae/corneas clear. PERRL, EOM's intact. Fundi benign. Ears: normal TM's and external ear canals both ears Nose: Nares normal.  Septum midline. Mucosa normal. No drainage or sinus tenderness. Throat: lips, mucosa, and tongue normal; teeth and gums normal Neck: no adenopathy, no carotid bruit, no JVD, supple, symmetrical, trachea midline and thyroid not enlarged, symmetric, no tenderness/mass/nodules Back: symmetric, no curvature. ROM normal. No CVA tenderness. Lungs: clear to auscultation bilaterally Breasts: normal appearance, no masses or tenderness Heart: regular rate and rhythm, S1, S2 normal, no murmur, click, rub or gallop Abdomen: soft, non-tender; bowel sounds normal; no masses,  no organomegaly Pelvic: not indicated; post-menopausal, no abnormal Pap smears in past Extremities: extremities normal, atraumatic, no cyanosis or edema Pulses: 2+ and symmetric Skin: Skin color, texture, turgor normal. No rashes or lesions Lymph nodes: Cervical, supraclavicular, and axillary nodes normal. Neurologic: Alert and oriented X 3, normal strength and tone. Normal symmetric reflexes. Normal coordination and gait Psych-- no anxiety, no depression      Assessment:     cpe     Plan:     During the course of the visit the patient was educated and counseled about appropriate screening and preventive services including:    Pneumococcal vaccine   Influenza vaccine  Screening mammography  Screening Pap smear and pelvic exam   Bone densitometry screening  Colorectal cancer screening  Diabetes screening  Glaucoma screening  Advanced directives: has NO advanced directive - not interested in additional information  Diet review for nutrition referral? Yes ____  Not Indicated ____   Patient Instructions (the written plan) was given to the patient.  Medicare Attestation I have personally reviewed: The patient's medical and social history Their use of alcohol, tobacco or illicit drugs Their current medications and supplements The patient's functional ability including ADLs,fall risks, home safety risks,  cognitive, and hearing and visual impairment Diet and physical activities Evidence for depression or mood disorders  The patient's weight, height, BMI, and visual acuity have been recorded in the chart.  I have made referrals, counseling, and provided education to the patient based on review of the above and I have provided the patient with a written personalized care plan for preventive services.     Loreen Freud, [place]   03/02/2013

## 2013-03-02 NOTE — Patient Instructions (Addendum)
Preventive Care for Adults, Female A healthy lifestyle and preventive care can promote health and wellness. Preventive health guidelines for women include the following key practices.  A routine yearly physical is a good way to check with your caregiver about your health and preventive screening. It is a chance to share any concerns and updates on your health, and to receive a thorough exam.  Visit your dentist for a routine exam and preventive care every 6 months. Brush your teeth twice a day and floss once a day. Good oral hygiene prevents tooth decay and gum disease.  The frequency of eye exams is based on your age, health, family medical history, use of contact lenses, and other factors. Follow your caregiver's recommendations for frequency of eye exams.  Eat a healthy diet. Foods like vegetables, fruits, whole grains, low-fat dairy products, and lean protein foods contain the nutrients you need without too many calories. Decrease your intake of foods high in solid fats, added sugars, and salt. Eat the right amount of calories for you.Get information about a proper diet from your caregiver, if necessary.  Regular physical exercise is one of the most important things you can [place] for your health. Most adults should get at least 150 minutes of moderate-intensity exercise (any activity that increases your heart rate and causes you to sweat) each week. In addition, most adults need muscle-strengthening exercises on 2 or more days a week.  Maintain a healthy weight. The body mass index (BMI) is a screening tool to identify possible weight problems. It provides an estimate of body fat based on height and weight. Your caregiver can help determine your BMI, and can help you achieve or maintain a healthy weight.For adults 20 years and older:  A BMI below 18.5 is considered underweight.  A BMI of 18.5 to 24.9 is normal.  A BMI of 25 to 29.9 is considered overweight.  A BMI of 30 and above is  considered obese.  Maintain normal blood lipids and cholesterol levels by exercising and minimizing your intake of saturated fat. Eat a balanced diet with plenty of fruit and vegetables. Blood tests for lipids and cholesterol should begin at age 20 and be repeated every 5 years. If your lipid or cholesterol levels are high, you are over 50, or you are at high risk for heart disease, you may need your cholesterol levels checked more frequently.Ongoing high lipid and cholesterol levels should be treated with medicines if diet and exercise are not effective.  If you smoke, find out from your caregiver how to quit. If you [place] not use tobacco, [place] not start.  If you are pregnant, [place] not drink alcohol. If you are breastfeeding, be very cautious about drinking alcohol. If you are not pregnant and choose to drink alcohol, [place] not exceed 1 drink per day. One drink is considered to be 12 ounces (355 mL) of beer, 5 ounces (148 mL) of wine, or 1.5 ounces (44 mL) of liquor.  Avoid use of street drugs. [place] not share needles with anyone. Ask for help if you need support or instructions about stopping the use of drugs.  High blood pressure causes heart disease and increases the risk of stroke. Your blood pressure should be checked at least every 1 to 2 years. Ongoing high blood pressure should be treated with medicines if weight loss and exercise are not effective.  If you are 55 to 72 years old, ask your caregiver if you should take aspirin to prevent strokes.  Diabetes   screening involves taking a blood sample to check your fasting blood sugar level. This should be done once every 3 years, after age 45, if you are within normal weight and without risk factors for diabetes. Testing should be considered at a younger age or be carried out more frequently if you are overweight and have at least 1 risk factor for diabetes.  Breast cancer screening is essential preventive care for women. You should practice "breast  self-awareness." This means understanding the normal appearance and feel of your breasts and may include breast self-examination. Any changes detected, no matter how small, should be reported to a caregiver. Women in their 20s and 30s should have a clinical breast exam (CBE) by a caregiver as part of a regular health exam every 1 to 3 years. After age 40, women should have a CBE every year. Starting at age 40, women should consider having a mammography (breast X-ray test) every year. Women who have a family history of breast cancer should talk to their caregiver about genetic screening. Women at a high risk of breast cancer should talk to their caregivers about having magnetic resonance imaging (MRI) and a mammography every year.  The Pap test is a screening test for cervical cancer. A Pap test can show cell changes on the cervix that might become cervical cancer if left untreated. A Pap test is a procedure in which cells are obtained and examined from the lower end of the uterus (cervix).  Women should have a Pap test starting at age 21.  Between ages 21 and 29, Pap tests should be repeated every 2 years.  Beginning at age 30, you should have a Pap test every 3 years as long as the past 3 Pap tests have been normal.  Some women have medical problems that increase the chance of getting cervical cancer. Talk to your caregiver about these problems. It is especially important to talk to your caregiver if a new problem develops soon after your last Pap test. In these cases, your caregiver may recommend more frequent screening and Pap tests.  The above recommendations are the same for women who have or have not gotten the vaccine for human papillomavirus (HPV).  If you had a hysterectomy for a problem that was not cancer or a condition that could lead to cancer, then you no longer need Pap tests. Even if you no longer need a Pap test, a regular exam is a good idea to make sure no other problems are  starting.  If you are between ages 65 and 70, and you have had normal Pap tests going back 10 years, you no longer need Pap tests. Even if you no longer need a Pap test, a regular exam is a good idea to make sure no other problems are starting.  If you have had past treatment for cervical cancer or a condition that could lead to cancer, you need Pap tests and screening for cancer for at least 20 years after your treatment.  If Pap tests have been discontinued, risk factors (such as a new sexual partner) need to be reassessed to determine if screening should be resumed.  The HPV test is an additional test that may be used for cervical cancer screening. The HPV test looks for the virus that can cause the cell changes on the cervix. The cells collected during the Pap test can be tested for HPV. The HPV test could be used to screen women aged 30 years and older, and should   be used in women of any age who have unclear Pap test results. After the age of 30, women should have HPV testing at the same frequency as a Pap test.  Colorectal cancer can be detected and often prevented. Most routine colorectal cancer screening begins at the age of 50 and continues through age 75. However, your caregiver may recommend screening at an earlier age if you have risk factors for colon cancer. On a yearly basis, your caregiver may provide home test kits to check for hidden blood in the stool. Use of a small camera at the end of a tube, to directly examine the colon (sigmoidoscopy or colonoscopy), can detect the earliest forms of colorectal cancer. Talk to your caregiver about this at age 50, when routine screening begins. Direct examination of the colon should be repeated every 5 to 10 years through age 75, unless early forms of pre-cancerous polyps or small growths are found.  Hepatitis C blood testing is recommended for all people born from 1945 through 1965 and any individual with known risks for hepatitis C.  Practice  safe sex. Use condoms and avoid high-risk sexual practices to reduce the spread of sexually transmitted infections (STIs). STIs include gonorrhea, chlamydia, syphilis, trichomonas, herpes, HPV, and human immunodeficiency virus (HIV). Herpes, HIV, and HPV are viral illnesses that have no cure. They can result in disability, cancer, and death. Sexually active women aged 25 and younger should be checked for chlamydia. Older women with new or multiple partners should also be tested for chlamydia. Testing for other STIs is recommended if you are sexually active and at increased risk.  Osteoporosis is a disease in which the bones lose minerals and strength with aging. This can result in serious bone fractures. The risk of osteoporosis can be identified using a bone density scan. Women ages 65 and over and women at risk for fractures or osteoporosis should discuss screening with their caregivers. Ask your caregiver whether you should take a calcium supplement or vitamin D to reduce the rate of osteoporosis.  Menopause can be associated with physical symptoms and risks. Hormone replacement therapy is available to decrease symptoms and risks. You should talk to your caregiver about whether hormone replacement therapy is right for you.  Use sunscreen with sun protection factor (SPF) of 30 or more. Apply sunscreen liberally and repeatedly throughout the day. You should seek shade when your shadow is shorter than you. Protect yourself by wearing long sleeves, pants, a wide-brimmed hat, and sunglasses year round, whenever you are outdoors.  Once a month, Romey a whole body skin exam, using a mirror to look at the skin on your back. Notify your caregiver of new moles, moles that have irregular borders, moles that are larger than a pencil eraser, or moles that have changed in shape or color.  Stay current with required immunizations.  Influenza. You need a dose every fall (or winter). The composition of the flu vaccine  changes each year, so being vaccinated once is not enough.  Pneumococcal polysaccharide. You need 1 to 2 doses if you smoke cigarettes or if you have certain chronic medical conditions. You need 1 dose at age 65 (or older) if you have never been vaccinated.  Tetanus, diphtheria, pertussis (Tdap, Td). Get 1 dose of Tdap vaccine if you are younger than age 65, are over 65 and have contact with an infant, are a healthcare worker, are pregnant, or simply want to be protected from whooping cough. After that, you need a Td   booster dose every 10 years. Consult your caregiver if you have not had at least 3 tetanus and diphtheria-containing shots sometime in your life or have a deep or dirty wound.  HPV. You need this vaccine if you are a woman age 26 or younger. The vaccine is given in 3 doses over 6 months.  Measles, mumps, rubella (MMR). You need at least 1 dose of MMR if you were born in 1957 or later. You may also need a second dose.  Meningococcal. If you are age 19 to 21 and a first-year college student living in a residence hall, or have one of several medical conditions, you need to get vaccinated against meningococcal disease. You may also need additional booster doses.  Zoster (shingles). If you are age 60 or older, you should get this vaccine.  Varicella (chickenpox). If you have never had chickenpox or you were vaccinated but received only 1 dose, talk to your caregiver to find out if you need this vaccine.  Hepatitis A. You need this vaccine if you have a specific risk factor for hepatitis A virus infection or you simply wish to be protected from this disease. The vaccine is usually given as 2 doses, 6 to 18 months apart.  Hepatitis B. You need this vaccine if you have a specific risk factor for hepatitis B virus infection or you simply wish to be protected from this disease. The vaccine is given in 3 doses, usually over 6 months. Preventive Services / Frequency Ages 19 to 39  Blood  pressure check.** / Every 1 to 2 years.  Lipid and cholesterol check.** / Every 5 years beginning at age 20.  Clinical breast exam.** / Every 3 years for women in their 20s and 30s.  Pap test.** / Every 2 years from ages 21 through 29. Every 3 years starting at age 30 through age 65 or 70 with a history of 3 consecutive normal Pap tests.  HPV screening.** / Every 3 years from ages 30 through ages 65 to 70 with a history of 3 consecutive normal Pap tests.  Hepatitis C blood test.** / For any individual with known risks for hepatitis C.  Skin self-exam. / Monthly.  Influenza immunization.** / Every year.  Pneumococcal polysaccharide immunization.** / 1 to 2 doses if you smoke cigarettes or if you have certain chronic medical conditions.  Tetanus, diphtheria, pertussis (Tdap, Td) immunization. / A one-time dose of Tdap vaccine. After that, you need a Td booster dose every 10 years.  HPV immunization. / 3 doses over 6 months, if you are 26 and younger.  Measles, mumps, rubella (MMR) immunization. / You need at least 1 dose of MMR if you were born in 1957 or later. You may also need a second dose.  Meningococcal immunization. / 1 dose if you are age 19 to 21 and a first-year college student living in a residence hall, or have one of several medical conditions, you need to get vaccinated against meningococcal disease. You may also need additional booster doses.  Varicella immunization.** / Consult your caregiver.  Hepatitis A immunization.** / Consult your caregiver. 2 doses, 6 to 18 months apart.  Hepatitis B immunization.** / Consult your caregiver. 3 doses usually over 6 months. Ages 40 to 64  Blood pressure check.** / Every 1 to 2 years.  Lipid and cholesterol check.** / Every 5 years beginning at age 20.  Clinical breast exam.** / Every year after age 40.  Mammogram.** / Every year beginning at age 40   and continuing for as long as you are in good health. Consult with your  caregiver.  Pap test.** / Every 3 years starting at age 30 through age 65 or 70 with a history of 3 consecutive normal Pap tests.  HPV screening.** / Every 3 years from ages 30 through ages 65 to 70 with a history of 3 consecutive normal Pap tests.  Fecal occult blood test (FOBT) of stool. / Every year beginning at age 50 and continuing until age 75. You may not need to [place] this test if you get a colonoscopy every 10 years.  Flexible sigmoidoscopy or colonoscopy.** / Every 5 years for a flexible sigmoidoscopy or every 10 years for a colonoscopy beginning at age 50 and continuing until age 75.  Hepatitis C blood test.** / For all people born from 1945 through 1965 and any individual with known risks for hepatitis C.  Skin self-exam. / Monthly.  Influenza immunization.** / Every year.  Pneumococcal polysaccharide immunization.** / 1 to 2 doses if you smoke cigarettes or if you have certain chronic medical conditions.  Tetanus, diphtheria, pertussis (Tdap, Td) immunization.** / A one-time dose of Tdap vaccine. After that, you need a Td booster dose every 10 years.  Measles, mumps, rubella (MMR) immunization. / You need at least 1 dose of MMR if you were born in 1957 or later. You may also need a second dose.  Varicella immunization.** / Consult your caregiver.  Meningococcal immunization.** / Consult your caregiver.  Hepatitis A immunization.** / Consult your caregiver. 2 doses, 6 to 18 months apart.  Hepatitis B immunization.** / Consult your caregiver. 3 doses, usually over 6 months. Ages 65 and over  Blood pressure check.** / Every 1 to 2 years.  Lipid and cholesterol check.** / Every 5 years beginning at age 20.  Clinical breast exam.** / Every year after age 40.  Mammogram.** / Every year beginning at age 40 and continuing for as long as you are in good health. Consult with your caregiver.  Pap test.** / Every 3 years starting at age 30 through age 65 or 70 with a 3  consecutive normal Pap tests. Testing can be stopped between 65 and 70 with 3 consecutive normal Pap tests and no abnormal Pap or HPV tests in the past 10 years.  HPV screening.** / Every 3 years from ages 30 through ages 65 or 70 with a history of 3 consecutive normal Pap tests. Testing can be stopped between 65 and 70 with 3 consecutive normal Pap tests and no abnormal Pap or HPV tests in the past 10 years.  Fecal occult blood test (FOBT) of stool. / Every year beginning at age 50 and continuing until age 75. You may not need to [place] this test if you get a colonoscopy every 10 years.  Flexible sigmoidoscopy or colonoscopy.** / Every 5 years for a flexible sigmoidoscopy or every 10 years for a colonoscopy beginning at age 50 and continuing until age 75.  Hepatitis C blood test.** / For all people born from 1945 through 1965 and any individual with known risks for hepatitis C.  Osteoporosis screening.** / A one-time screening for women ages 65 and over and women at risk for fractures or osteoporosis.  Skin self-exam. / Monthly.  Influenza immunization.** / Every year.  Pneumococcal polysaccharide immunization.** / 1 dose at age 65 (or older) if you have never been vaccinated.  Tetanus, diphtheria, pertussis (Tdap, Td) immunization. / A one-time dose of Tdap vaccine if you are over   65 and have contact with an infant, are a healthcare worker, or simply want to be protected from whooping cough. After that, you need a Td booster dose every 10 years.  Varicella immunization.** / Consult your caregiver.  Meningococcal immunization.** / Consult your caregiver.  Hepatitis A immunization.** / Consult your caregiver. 2 doses, 6 to 18 months apart.  Hepatitis B immunization.** / Check with your caregiver. 3 doses, usually over 6 months. ** Family history and personal history of risk and conditions may change your caregiver's recommendations. Document Released: 12/16/2001 Document Revised: 01/12/2012  Document Reviewed: 03/17/2011 [employer] Patient Information 2013 [employer].  

## 2013-03-03 NOTE — Assessment & Plan Note (Signed)
Check bmd 

## 2013-03-04 LAB — POCT URINALYSIS DIPSTICK
Blood, UA: NEGATIVE
Glucose, UA: NEGATIVE
Protein, UA: NEGATIVE
Spec Grav, UA: <=1.005
Urobilinogen, UA: 0.2

## 2013-11-18 ENCOUNTER — Ambulatory Visit (INDEPENDENT_AMBULATORY_CARE_PROVIDER_SITE_OTHER): Payer: Medicare Other | Admitting: *Deleted

## 2013-11-18 DIAGNOSIS — Z23 Encounter for immunization: Secondary | ICD-10-CM

## 2014-03-03 ENCOUNTER — Telehealth: Payer: Self-pay

## 2014-03-03 NOTE — Telephone Encounter (Addendum)
Called home number listed.   Son, Ketter Bialecki, answered and stated that patient was not home.  Message left with him to remind patient of her appointment scheduled for Monday, May 4th at 0830.  He stated he would.  Patient speaks and understands limited [place].  She will need a [place].  An interpreter is already scheduled:  [place] [phone]; [place] USING THE LANGUAGE LINE [place] [phone].  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

## 2014-03-06 ENCOUNTER — Encounter: Payer: Self-pay | Admitting: Family Medicine

## 2014-03-06 ENCOUNTER — Encounter: Payer: Medicare Other | Admitting: Family Medicine

## 2014-03-06 ENCOUNTER — Ambulatory Visit (INDEPENDENT_AMBULATORY_CARE_PROVIDER_SITE_OTHER): Payer: Medicare Other | Admitting: Family Medicine

## 2014-03-06 VITALS — BP 120/74 | HR 78 | Temp 97.8°F | Ht 59.5 in | Wt 128.0 lb

## 2014-03-06 DIAGNOSIS — G2581 Restless legs syndrome: Secondary | ICD-10-CM

## 2014-03-06 DIAGNOSIS — N949 Unspecified condition associated with female genital organs and menstrual cycle: Secondary | ICD-10-CM

## 2014-03-06 DIAGNOSIS — Z1231 Encounter for screening mammogram for malignant neoplasm of breast: Secondary | ICD-10-CM

## 2014-03-06 DIAGNOSIS — R102 Pelvic and perineal pain: Secondary | ICD-10-CM

## 2014-03-06 DIAGNOSIS — Z0000 Encounter for general adult medical examination without abnormal findings: Secondary | ICD-10-CM

## 2014-03-06 DIAGNOSIS — Z23 Encounter for immunization: Secondary | ICD-10-CM

## 2014-03-06 DIAGNOSIS — E2839 Other primary ovarian failure: Secondary | ICD-10-CM

## 2014-03-06 DIAGNOSIS — Z136 Encounter for screening for cardiovascular disorders: Secondary | ICD-10-CM

## 2014-03-06 DIAGNOSIS — R109 Unspecified abdominal pain: Secondary | ICD-10-CM

## 2014-03-06 LAB — POCT URINALYSIS DIPSTICK
Bilirubin, UA: NEGATIVE
GLUCOSE UA: NEGATIVE
Ketones, UA: NEGATIVE
LEUKOCYTES UA: NEGATIVE
NITRITE UA: NEGATIVE
Protein, UA: NEGATIVE
RBC UA: NEGATIVE
Spec Grav, UA: 1.02
UROBILINOGEN UA: 0.2
pH, UA: 7

## 2014-03-06 LAB — BASIC METABOLIC PANEL
BUN: 12 mg/dL (ref 6–23)
CO2: 28 mEq/L (ref 19–32)
CREATININE: 0.7 mg/dL (ref 0.4–1.2)
Calcium: 8.9 mg/dL (ref 8.4–10.5)
Chloride: 107 mEq/L (ref 96–112)
GFR: 91.7 mL/min (ref >60.00)
Glucose, Bld: 92 mg/dL (ref 70–99)
POTASSIUM: 3.7 meq/L (ref 3.5–5.1)
Sodium: 141 mEq/L (ref 135–145)

## 2014-03-06 LAB — CBC WITH DIFFERENTIAL/PLATELET
BASOS PCT: 0.4 % (ref 0.0–3.0)
Basophils Absolute: 0 10*3/uL (ref 0.0–0.1)
EOS PCT: 1.3 % (ref 0.0–5.0)
Eosinophils Absolute: 0.1 10*3/uL (ref 0.0–0.7)
HEMATOCRIT: 37.3 % (ref 36.0–46.0)
Hemoglobin: 12.6 g/dL (ref 12.0–15.0)
Lymphocytes Relative: 33 % (ref 12.0–46.0)
Lymphs Abs: 1.9 10*3/uL (ref 0.7–4.0)
MCHC: 33.7 g/dL (ref 30.0–36.0)
MCV: 90.7 fl (ref 78.0–100.0)
MONOS PCT: 5.1 % (ref 3.0–12.0)
Monocytes Absolute: 0.3 10*3/uL (ref 0.1–1.0)
NEUTROS PCT: 60.2 % (ref 43.0–77.0)
Neutro Abs: 3.4 10*3/uL (ref 1.4–7.7)
PLATELETS: 246 10*3/uL (ref 150.0–400.0)
RBC: 4.11 Mil/uL (ref 3.87–5.11)
RDW: 12.5 % (ref 11.5–14.6)
WBC: 5.7 10*3/uL (ref 4.5–10.5)

## 2014-03-06 LAB — HEPATIC FUNCTION PANEL
ALT: 18 U/L (ref 0–35)
AST: 23 U/L (ref 0–37)
Albumin: 4.3 g/dL (ref 3.5–5.2)
Alkaline Phosphatase: 62 U/L (ref 39–117)
BILIRUBIN DIRECT: 0 mg/dL (ref 0.0–0.3)
BILIRUBIN TOTAL: 0.8 mg/dL (ref 0.3–1.2)
Total Protein: 7.7 g/dL (ref 6.0–8.3)

## 2014-03-06 LAB — LIPID PANEL
CHOL/HDL RATIO: 5
Cholesterol: 192 mg/dL (ref 0–200)
HDL: 42.5 mg/dL (ref >39.00)
LDL CALC: 117 mg/dL — AB (ref 0–99)
Triglycerides: 165 mg/dL — ABNORMAL HIGH (ref 0.0–149.0)
VLDL: 33 mg/dL (ref 0.0–40.0)

## 2014-03-06 MED ORDER — MOMETASONE FUROATE 50 MCG/ACT NA SUSP: 2.0000 | Freq: Every day | NASAL | Status: DC

## 2014-03-06 MED ORDER — ZOSTER VACCINE LIVE 19400 UNT/0.65ML ~~LOC~~ SOLR: 0.6500 mL | Freq: Once | SUBCUTANEOUS | Status: DC

## 2014-03-06 MED ORDER — CETIRIZINE HCL 10 MG PO TABS: 10.0000 mg | ORAL_TABLET | Freq: Every day | ORAL | Status: DC

## 2014-03-06 NOTE — Progress Notes (Signed)
Subjective:    Ariel Williams is a 73 y.o. female who presents for Medicare Annual/Subsequent preventive examination.  Pt here with translater--  Tona Sensing   Preventive Screening-Counseling & Management  Tobacco History  Smoking status  . Never Smoker   Smokeless tobacco  . Not on file     Problems Prior to Visit 1. depression  Current Problems (verified) Patient Active Problem List   Diagnosis Date Noted  . SINUSITIS - ACUTE-NOS 12/10/2010  . CONTACT DERMATITIS&OTH ECZEMA DUE OTH SPEC AGENT 10/07/2010  . NECK SPRAIN AND STRAIN 10/07/2010  . RESTLESS LEG SYNDROME 11/19/2009  . 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 GERD 11/23/2008  . DISUSE OSTEOPOROSIS 11/23/2008  . UNSPECIFIED OTITIS MEDIA 11/09/2008  . DYSPHAGIA UNSPECIFIED 11/09/2008  . SHINGLES, HX OF 01/20/2008  . UNS ADVRS EFF UNS RX MEDICINAL&BIOLOGICAL SBSTNC 01/17/2008  . POSTMENOPAUSAL STATUS 07/26/2007  . Unspecified Chest Pain 07/26/2007    Medications Prior to Visit Current Outpatient Prescriptions on File Prior to Visit  Medication Sig Dispense Refill  . acetaminophen (TYLENOL) 325 MG tablet Take 650 mg by mouth every 6 (six) hours as needed.        . lidocaine (LIDODERM) 5 % Place 1 patch onto the skin daily. Remove & Discard patch within 12 hours or as directed by MD  30 patch  1   No current facility-administered medications on file prior to visit.    Current Medications (verified) Current Outpatient Prescriptions  Medication Sig Dispense Refill  . acetaminophen (TYLENOL) 325 MG tablet Take 650 mg by mouth every 6 (six) hours as needed.        . cetirizine (ZYRTEC) 10 MG tablet Take 1 tablet (10 mg total) by mouth daily.  30 tablet  11  . lidocaine (LIDODERM) 5 % Place 1 patch onto the skin daily. Remove & Discard patch within 12 hours or as directed by MD  30 patch  1  . mometasone (NASONEX) 50 MCG/ACT nasal spray Place 2 sprays into the nose  daily.  17 g  11  . zoster vaccine live, PF, (ZOSTAVAX) 23762 UNT/0.65ML injection Inject 19,400 Units into the skin once.  1 vial  0   No current facility-administered medications for this visit.     Allergies (verified) Tramadol hcl   PAST HISTORY  Family History Family History  Problem Relation Age of Onset  . Asthma Brother     Social History History  Substance Use Topics  . Smoking status: Never Smoker   . Smokeless tobacco: Not on file  . Alcohol Use: No     Are there smokers in your home (other than you)? No  Risk Factors Current exercise habits: The patient does not participate in regular exercise at present.  Dietary issues discussed: na   Cardiac risk factors: advanced age (older than 64 for men, 42 for women) and sedentary lifestyle.  Depression Screen (Note: if answer to either of the following is "Yes", a more complete depression screening is indicated)   Over the past two weeks, have you felt down, depressed or hopeless? Yes  Over the past two weeks, have you felt little interest or pleasure in doing things? No  Have you lost interest or pleasure in daily life? No  Renda you often feel hopeless? No  Mantell you cry easily over simple problems? No  Activities of Daily Living In your present state of health, Sleight you have any difficulty performing the following activities?:  Driving? No Managing money?  No Feeding yourself? No Getting from bed to chair? No\ Preparing food and eating?: No Bathing or showering? No Getting dressed: No Getting to the toilet? No Using the toilet:No Moving around from place to place: No In the past year have you fallen or had a near fall?:No   Are you sexually active?  Yes  Hamrick you have more than one partner?  No  Hearing Difficulties: No Eldred you often ask people to speak up or repeat themselves? No Lalani you experience ringing or noises in your ears? No Lemma you have difficulty understanding soft or whispered voices? No   Hogen you  feel that you have a problem with memory? No  Duerson you often misplace items? No  Conran you feel safe at home?  Yes  Cognitive Testing  Alert? Yes  Normal Appearance?Yes  Oriented to person? Yes  Place? Yes   Time? Yes  Recall of three objects?  Yes  Can perform simple calculations? Yes  Displays appropriate judgment?Yes  Can read the correct time from a watch face?Yes   Advanced Directives have been discussed with the patient? Yes  List the Names of Other Physician/Practitioners you currently use: 1.  opth--[facility]amnese dr in [facility]  Indicate any recent Medical Services you may have received from other than [facility] providers in the past year (date may be approximate).  Immunization History  Administered Date(s) Administered  . Influenza Split 08/05/2012  . Influenza Whole 11/19/2009, 07/17/2010, 07/19/2011  . Influenza,inj,Quad PF,36+ Mos 11/18/2013  . Pneumococcal Polysaccharide-23 07/26/2007    Screening Tests Health Maintenance  Topic Date Due  . Tetanus/tdap  03/12/1960  . Zostavax  03/12/2001  . Mammogram  11/26/2010  . Influenza Vaccine  06/03/2014  . Colonoscopy  09/09/2019  . Pneumococcal Polysaccharide Vaccine Age 40 And Over  Completed    All answers were reviewed with the patient and necessary referrals were made:  Garnet Koyanagi, [place]   03/06/2014   History reviewed:  She  has a past medical history of Arthritis. She  does not have any pertinent problems on file. She  has past surgical history that includes Tubal ligation and Cesarean section. Her family history includes Asthma in her brother. She  reports that she has never smoked. She does not have any smokeless tobacco history on file. She reports that she does not drink alcohol or use illicit drugs. She has a current medication list which includes the following prescription(s): acetaminophen, cetirizine, lidocaine, mometasone, and zoster vaccine live (pf). Current Outpatient Prescriptions on File Prior to Visit   Medication Sig Dispense Refill  . acetaminophen (TYLENOL) 325 MG tablet Take 650 mg by mouth every 6 (six) hours as needed.        . lidocaine (LIDODERM) 5 % Place 1 patch onto the skin daily. Remove & Discard patch within 12 hours or as directed by MD  30 patch  1   No current facility-administered medications on file prior to visit.   She is allergic to tramadol hcl.  Review of Systems  Review of Systems  Constitutional: Negative for activity change, appetite change and fatigue.  HENT: Negative for hearing loss, congestion, tinnitus and ear discharge.   Eyes: Negative for visual disturbance (see optho q1y -- vision corrected to 20/20 with glasses).  Respiratory: Negative for cough, chest tightness and shortness of breath.   Cardiovascular: Negative for chest pain, palpitations and leg swelling.  Gastrointestinal: Negative for, diarrhea, constipation and abdominal distention.  pt c/o ocassional abd  pressure and pain in L side and LLQ Genitourinary: Negative for urgency, frequency, decreased urine volume and difficulty urinating.  Musculoskeletal: Negative for back pain, arthralgias and gait problem.  Skin: Negative for color change, pallor and rash.  Neurological: Negative for dizziness, light-headedness, numbness and headaches.  Hematological: Negative for adenopathy. Does not bruise/bleed easily.  Psychiatric/Behavioral: Negative for suicidal ideas, confusion, sleep disturbance, self-injury, dysphoric mood, decreased concentration and agitation.  Pt is able to read and write and can Hagans all ADLs No risk for falling No abuse/ violence in home      Objective:     Vision by Snellen chart: opth  Body mass index is 25.43 kg/(m^2). BP 120/74  Pulse 78  Temp(Src) 97.8 F (36.6 C) (Oral)  Ht 4' 11.5" (1.511 m)  Wt 128 lb (58.06 kg)  BMI 25.43 kg/m2  SpO2 98%  BP 120/74  Pulse 78  Temp(Src) 97.8 F (36.6 C) (Oral)  Ht 4' 11.5" (1.511 m)  Wt 128 lb (58.06 kg)  BMI 25.43  kg/m2  SpO2 98% General appearance: alert, cooperative, appears stated age and no distress Head: Normocephalic, without obvious abnormality, atraumatic Eyes: conjunctivae/corneas clear. PERRL, EOM's intact. Fundi benign. Ears: normal TM's and external ear canals both ears Nose: Nares normal. Septum midline. Mucosa normal. No drainage or sinus tenderness. Throat: lips, mucosa, and tongue normal; teeth and gums normal Neck: no adenopathy, no carotid bruit, no JVD, supple, symmetrical, trachea midline and thyroid not enlarged, symmetric, no tenderness/mass/nodules Back: symmetric, no curvature. ROM normal. No CVA tenderness. Lungs: clear to auscultation bilaterally Breasts: normal appearance, no masses or tenderness Heart: regular rate and rhythm, S1, S2 normal, no murmur, click, rub or gallop Abdomen: soft , nontender Pelvic: deferred Extremities: extremities normal, atraumatic, no cyanosis or edema Pulses: 2+ and symmetric Skin: Skin color, texture, turgor normal. No rashes or lesions Lymph nodes: Cervical, supraclavicular, and axillary nodes normal. Neurologic: Alert and oriented X 3, normal strength and tone. Normal symmetric reflexes. Normal coordination and gait Psych-- no depression, no anxiety      Assessment:     cpe      Plan:     During the course of the visit the patient was educated and counseled about appropriate screening and preventive services including:    Pneumococcal vaccine   Influenza vaccine  Td vaccine  Screening mammography  Screening Pap smear and pelvic exam   Bone densitometry screening  Colorectal cancer screening  Glaucoma screening  Advanced directives: has NO advanced directive - not interested in additional information  Diet review for nutrition referral? Yes ____  Not Indicated __x__   Patient Instructions (the written plan) was given to the patient.  Medicare Attestation I have personally reviewed: The patient's medical and  social history Their use of alcohol, tobacco or illicit drugs Their current medications and supplements The patient's functional ability including ADLs,fall risks, home safety risks, cognitive, and hearing and visual impairment Diet and physical activities Evidence for depression or mood disorders  The patient's weight, height, BMI, and visual acuity have been recorded in the chart.  I have made referrals, counseling, and provided education to the patient based on review of the above and I have provided the patient with a written personalized care plan for preventive services.    1. Screening, ischemic heart disease   - Basic metabolic panel - CBC with Differential - Hepatic function panel - Lipid panel - POCT urinalysis dipstick  2. Need for shingles vaccine   - zoster vaccine live,  PF, (ZOSTAVAX) 16384 UNT/0.65ML injection; Inject 19,400 Units into the skin once.  Dispense: 1 vial; Refill: 0 - Varicella zoster antibody, IgG  3. Preventative health care    4. Abdominal pain, unspecified site   - US Abdomen Complete; Future  5. Pelvic pain in female   - US Pelvis Complete; Future - US Transvaginal Non-OB; Future   Garnet Koyanagi, Digirol[place]   03/06/2014

## 2014-03-06 NOTE — Progress Notes (Signed)
Pre visit review using our clinic review tool, if applicable. No additional management support is needed unless otherwise documented below in the visit note. 

## 2014-03-06 NOTE — Patient Instructions (Signed)
Preventive Care for Adults, Female A healthy lifestyle and preventive care can promote health and wellness. Preventive health guidelines for women include the following key practices.  A routine yearly physical is a good way to check with your health care provider about your health and preventive screening. It is a chance to share any concerns and updates on your health and to receive a thorough exam.  Visit your dentist for a routine exam and preventive care every 6 months. Brush your teeth twice a day and floss once a day. Good oral hygiene prevents tooth decay and gum disease.  The frequency of eye exams is based on your age, health, family medical history, use of contact lenses, and other factors. Follow your health care provider's recommendations for frequency of eye exams.  Eat a healthy diet. Foods like vegetables, fruits, whole grains, low-fat dairy products, and lean protein foods contain the nutrients you need without too many calories. Decrease your intake of foods high in solid fats, added sugars, and salt. Eat the right amount of calories for you.Get information about a proper diet from your health care provider, if necessary.  Regular physical exercise is one of the most important things you can [place] for your health. Most adults should get at least 150 minutes of moderate-intensity exercise (any activity that increases your heart rate and causes you to sweat) each week. In addition, most adults need muscle-strengthening exercises on 2 or more days a week.  Maintain a healthy weight. The body mass index (BMI) is a screening tool to identify possible weight problems. It provides an estimate of body fat based on height and weight. Your health care provider can find your BMI, and can help you achieve or maintain a healthy weight.For adults 20 years and older:  A BMI below 18.5 is considered underweight.  A BMI of 18.5 to 24.9 is normal.  A BMI of 25 to 29.9 is considered overweight.  A  BMI of 30 and above is considered obese.  Maintain normal blood lipids and cholesterol levels by exercising and minimizing your intake of saturated fat. Eat a balanced diet with plenty of fruit and vegetables. Blood tests for lipids and cholesterol should begin at age 62 and be repeated every 5 years. If your lipid or cholesterol levels are high, you are over 50, or you are at high risk for heart disease, you may need your cholesterol levels checked more frequently.Ongoing high lipid and cholesterol levels should be treated with medicines if diet and exercise are not working.  If you smoke, find out from your health care provider how to quit. If you [place] not use tobacco, Lev[place] not start.  Lung cancer screening is recommended for adults aged 36 80 years who are at high risk for developing lung cancer because of a history of smoking. A yearly low-dose CT scan of the lungs is recommended for people who have at least a 30-pack-year history of smoking and are a current smoker or have quit within the past 15 years. A pack year of smoking is smoking an average of 1 pack of cigarettes a day for 1 year (for example: 1 pack a day for 30 years or 2 packs a day for 15 years). Yearly screening should continue until the smoker has stopped smoking for at least 15 years. Yearly screening should be stopped for people who develop a health problem that would prevent them from having lung cancer treatment.  If you are pregnant, S[place]y not drink alcohol. If you  are breastfeeding, be very cautious about drinking alcohol. If you are not pregnant and choose to drink alcohol, Hence not have more than 1 drink per day. One drink is considered to be 12 ounces (355 mL) of beer, 5 ounces (148 mL) of wine, or 1.5 ounces (44 mL) of liquor.  Avoid use of street drugs. [place] not share needles with anyone. Ask for help if you need support or instructions about stopping the use of drugs.  High blood pressure causes heart disease and increases the risk  of stroke. Your blood pressure should be checked at least every 1 to 2 years. Ongoing high blood pressure should be treated with medicines if weight loss and exercise Lingard not work.  If you are 39 73 years old, ask your health care provider if you should take aspirin to prevent strokes.  Diabetes screening involves taking a blood sample to check your fasting blood sugar level. This should be done once every 3 years, after age 56, if you are within normal weight and without risk factors for diabetes. Testing should be considered at a younger age or be carried out more frequently if you are overweight and have at least 1 risk factor for diabetes.  Breast cancer screening is essential preventive care for women. You should practice "breast self-awareness." This means understanding the normal appearance and feel of your breasts and may include breast self-examination. Any changes detected, no matter how small, should be reported to a health care provider. Women in their 40s and 30s should have a clinical breast exam (CBE) by a health care provider as part of a regular health exam every 1 to 3 years. After age 28, women should have a CBE every year. Starting at age 72, women should consider having a mammogram (breast X-ray test) every year. Women who have a family history of breast cancer should talk to their health care provider about genetic screening. Women at a high risk of breast cancer should talk to their health care providers about having an MRI and a mammogram every year.  Breast cancer gene (BRCA)-related cancer risk assessment is recommended for women who have family members with BRCA-related cancers. BRCA-related cancers include breast, ovarian, tubal, and peritoneal cancers. Having family members with these cancers may be associated with an increased risk for harmful changes (mutations) in the breast cancer genes BRCA1 and BRCA2. Results of the assessment will determine the need for genetic counseling  and BRCA1 and BRCA2 testing.  The Pap test is a screening test for cervical cancer. A Pap test can show cell changes on the cervix that might become cervical cancer if left untreated. A Pap test is a procedure in which cells are obtained and examined from the lower end of the uterus (cervix).  Women should have a Pap test starting at age 59.  Between ages 42 and 13, Pap tests should be repeated every 2 years.  Beginning at age 53, you should have a Pap test every 3 years as long as the past 3 Pap tests have been normal.  Some women have medical problems that increase the chance of getting cervical cancer. Talk to your health care provider about these problems. It is especially important to talk to your health care provider if a new problem develops soon after your last Pap test. In these cases, your health care provider may recommend more frequent screening and Pap tests.  The above recommendations are the same for women who have or have not gotten the vaccine  for human papillomavirus (HPV).  If you had a hysterectomy for a problem that was not cancer or a condition that could lead to cancer, then you no longer need Pap tests. Even if you no longer need a Pap test, a regular exam is a good idea to make sure no other problems are starting.  If you are between ages 58 and 10 years, and you have had normal Pap tests going back 10 years, you no longer need Pap tests. Even if you no longer need a Pap test, a regular exam is a good idea to make sure no other problems are starting.  If you have had past treatment for cervical cancer or a condition that could lead to cancer, you need Pap tests and screening for cancer for at least 20 years after your treatment.  If Pap tests have been discontinued, risk factors (such as a new sexual partner) need to be reassessed to determine if screening should be resumed.  The HPV test is an additional test that may be used for cervical cancer screening. The HPV test  looks for the virus that can cause the cell changes on the cervix. The cells collected during the Pap test can be tested for HPV. The HPV test could be used to screen women aged 67 years and older, and should be used in women of any age who have unclear Pap test results. After the age of 65, women should have HPV testing at the same frequency as a Pap test.  Colorectal cancer can be detected and often prevented. Most routine colorectal cancer screening begins at the age of 25 years and continues through age 66 years. However, your health care provider may recommend screening at an earlier age if you have risk factors for colon cancer. On a yearly basis, your health care provider may provide home test kits to check for hidden blood in the stool. Use of a small camera at the end of a tube, to directly examine the colon (sigmoidoscopy or colonoscopy), can detect the earliest forms of colorectal cancer. Talk to your health care provider about this at age 79, when routine screening begins. Direct exam of the colon should be repeated every 5 10 years through age 47 years, unless early forms of pre-cancerous polyps or small growths are found.  People who are at an increased risk for hepatitis B should be screened for this virus. You are considered at high risk for hepatitis B if:  You were born in a country where hepatitis B occurs often. Talk with your health care provider about which countries are considered high risk.  Your parents were born in a high-risk country and you have not received a shot to protect against hepatitis B (hepatitis B vaccine).  You have HIV or AIDS.  You use needles to inject street drugs.  You live with, or have sex with, someone who has Hepatitis B.  You get hemodialysis treatment.  You take certain medicines for conditions like cancer, organ transplantation, and autoimmune conditions.  Hepatitis C blood testing is recommended for all people born from [birth year] through 1965 and  any individual with known risks for hepatitis C.  Practice safe sex. Use condoms and avoid high-risk sexual practices to reduce the spread of sexually transmitted infections (STIs). STIs include gonorrhea, chlamydia, syphilis, trichomonas, herpes, HPV, and human immunodeficiency virus (HIV). Herpes, HIV, and HPV are viral illnesses that have no cure. They can result in disability, cancer, and death. Sexually active women aged 66  years and younger should be checked for chlamydia. Older women with new or multiple partners should also be tested for chlamydia. Testing for other STIs is recommended if you are sexually active and at increased risk.  Osteoporosis is a disease in which the bones lose minerals and strength with aging. This can result in serious bone fractures or breaks. The risk of osteoporosis can be identified using a bone density scan. Women ages 18 years and over and women at risk for fractures or osteoporosis should discuss screening with their health care providers. Ask your health care provider whether you should take a calcium supplement or vitamin D to reduce the rate of osteoporosis.  Menopause can be associated with physical symptoms and risks. Hormone replacement therapy is available to decrease symptoms and risks. You should talk to your health care provider about whether hormone replacement therapy is right for you.  Use sunscreen. Apply sunscreen liberally and repeatedly throughout the day. You should seek shade when your shadow is shorter than you. Protect yourself by wearing long sleeves, pants, a wide-brimmed hat, and sunglasses year round, whenever you are outdoors.  Once a month, Tuggle a whole body skin exam, using a mirror to look at the skin on your back. Tell your health care provider of new moles, moles that have irregular borders, moles that are larger than a pencil eraser, or moles that have changed in shape or color.  Stay current with required vaccines  (immunizations).  Influenza vaccine. All adults should be immunized every year.  Tetanus, diphtheria, and acellular pertussis (Td, Tdap) vaccine. Pregnant women should receive 1 dose of Tdap vaccine during each pregnancy. The dose should be obtained regardless of the length of time since the last dose. Immunization is preferred during the 27th 36th week of gestation. An adult who has not previously received Tdap or who does not know her vaccine status should receive 1 dose of Tdap. This initial dose should be followed by tetanus and diphtheria toxoids (Td) booster doses every 10 years. Adults with an unknown or incomplete history of completing a 3-dose immunization series with Td-containing vaccines should begin or complete a primary immunization series including a Tdap dose. Adults should receive a Td booster every 10 years.  Varicella vaccine. An adult without evidence of immunity to varicella should receive 2 doses or a second dose if she has previously received 1 dose. Pregnant females who [place] not have evidence of immunity should receive the first dose after pregnancy. This first dose should be obtained before leaving the health care facility. The second dose should be obtained 4 8 weeks after the first dose.  Human papillomavirus (HPV) vaccine. Females aged 9 26 years who have not received the vaccine previously should obtain the 3-dose series. The vaccine is not recommended for use in pregnant females. However, pregnancy testing is not needed before receiving a dose. If a female is found to be pregnant after receiving a dose, no treatment is needed. In that case, the remaining doses should be delayed until after the pregnancy. Immunization is recommended for any person with an immunocompromised condition through the age of 51 years if she did not get any or all doses earlier. During the 3-dose series, the second dose should be obtained 4 8 weeks after the first dose. The third dose should be obtained  24 weeks after the first dose and 16 weeks after the second dose.  Zoster vaccine. One dose is recommended for adults aged 57 years or older unless certain  conditions are present.  Measles, mumps, and rubella (MMR) vaccine. Adults born before [birth year] generally are considered immune to measles and mumps. Adults born in [birth year] or later should have 1 or more doses of MMR vaccine unless there is a contraindication to the vaccine or there is laboratory evidence of immunity to each of the three diseases. A routine second dose of MMR vaccine should be obtained at least 28 days after the first dose for students attending postsecondary schools, health care workers, or international travelers. People who received inactivated measles vaccine or an unknown type of measles vaccine during 1963 1967 should receive 2 doses of MMR vaccine. People who received inactivated mumps vaccine or an unknown type of mumps vaccine before 1979 and are at high risk for mumps infection should consider immunization with 2 doses of MMR vaccine. For females of childbearing age, rubella immunity should be determined. If there is no evidence of immunity, females who are not pregnant should be vaccinated. If there is no evidence of immunity, females who are pregnant should delay immunization until after pregnancy. Unvaccinated health care workers born before [birth year] who lack laboratory evidence of measles, mumps, or rubella immunity or laboratory confirmation of disease should consider measles and mumps immunization with 2 doses of MMR vaccine or rubella immunization with 1 dose of MMR vaccine.  Pneumococcal 13-valent conjugate (PCV13) vaccine. When indicated, a person who is uncertain of her immunization history and has no record of immunization should receive the PCV13 vaccine. An adult aged 42 years or older who has certain medical conditions and has not been previously immunized should receive 1 dose of PCV13 vaccine. This PCV13 should be followed  with a dose of pneumococcal polysaccharide (PPSV23) vaccine. The PPSV23 vaccine dose should be obtained at least 8 weeks after the dose of PCV13 vaccine. An adult aged 4 years or older who has certain medical conditions and previously received 1 or more doses of PPSV23 vaccine should receive 1 dose of PCV13. The PCV13 vaccine dose should be obtained 1 or more years after the last PPSV23 vaccine dose.  Pneumococcal polysaccharide (PPSV23) vaccine. When PCV13 is also indicated, PCV13 should be obtained first. All adults aged 27 years and older should be immunized. An adult younger than age 33 years who has certain medical conditions should be immunized. Any person who resides in a nursing home or long-term care facility should be immunized. An adult smoker should be immunized. People with an immunocompromised condition and certain other conditions should receive both PCV13 and PPSV23 vaccines. People with human immunodeficiency virus (HIV) infection should be immunized as soon as possible after diagnosis. Immunization during chemotherapy or radiation therapy should be avoided. Routine use of PPSV23 vaccine is not recommended for American Indians, [place] Natives, or people younger than 65 years unless there are medical conditions that require PPSV23 vaccine. When indicated, people who have unknown immunization and have no record of immunization should receive PPSV23 vaccine. One-time revaccination 5 years after the first dose of PPSV23 is recommended for people aged 13 64 years who have chronic kidney failure, nephrotic syndrome, asplenia, or immunocompromised conditions. People who received 1 2 doses of PPSV23 before age 66 years should receive another dose of PPSV23 vaccine at age 27 years or later if at least 5 years have passed since the previous dose. Doses of PPSV23 are not needed for people immunized with PPSV23 at or after age 33 years.  Meningococcal vaccine. Adults with asplenia or persistent complement  component deficiencies should receive 2  doses of quadrivalent meningococcal conjugate (MenACWY-D) vaccine. The doses should be obtained at least 2 months apart. Microbiologists working with certain meningococcal bacteria, [place] recruits, people at risk during an outbreak, and people who travel to or live in countries with a high rate of meningitis should be immunized. A first-year college student up through age 49 years who is living in a residence hall should receive a dose if she did not receive a dose on or after her 16th birthday. Adults who have certain high-risk conditions should receive one or more doses of vaccine.  Hepatitis A vaccine. Adults who wish to be protected from this disease, have certain high-risk conditions, work with hepatitis A-infected animals, work in hepatitis A research labs, or travel to or work in countries with a high rate of hepatitis A should be immunized. Adults who were previously unvaccinated and who anticipate close contact with an international adoptee during the first 60 days after arrival in the [place] from a country with a high rate of hepatitis A should be immunized.  Hepatitis B vaccine. Adults who wish to be protected from this disease, have certain high-risk conditions, may be exposed to blood or other infectious body fluids, are household contacts or sex partners of hepatitis B positive people, are clients or workers in certain care facilities, or travel to or work in countries with a high rate of hepatitis B should be immunized.  Haemophilus influenzae type b (Hib) vaccine. A previously unvaccinated person with asplenia or sickle cell disease or having a scheduled splenectomy should receive 1 dose of Hib vaccine. Regardless of previous immunization, a recipient of a hematopoietic stem cell transplant should receive a 3-dose series 6 12 months after her successful transplant. Hib vaccine is not recommended for adults with HIV infection. Preventive  Services / Frequency Ages 24 to 39years  Blood pressure check.** / Every 1 to 2 years.  Lipid and cholesterol check.** / Every 5 years beginning at age 66.  Clinical breast exam.** / Every 3 years for women in their 12s and 24s.  BRCA-related cancer risk assessment.** / For women who have family members with a BRCA-related cancer (breast, ovarian, tubal, or peritoneal cancers).  Pap test.** / Every 2 years from ages 31 through 69. Every 3 years starting at age 64 through age 76 or 89 with a history of 3 consecutive normal Pap tests.  HPV screening.** / Every 3 years from ages 10 through ages 10 to 96 with a history of 3 consecutive normal Pap tests.  Hepatitis C blood test.** / For any individual with known risks for hepatitis C.  Skin self-exam. / Monthly.  Influenza vaccine. / Every year.  Tetanus, diphtheria, and acellular pertussis (Tdap, Td) vaccine.** / Consult your health care provider. Pregnant women should receive 1 dose of Tdap vaccine during each pregnancy. 1 dose of Td every 10 years.  Varicella vaccine.** / Consult your health care provider. Pregnant females who [place] not have evidence of immunity should receive the first dose after pregnancy.  HPV vaccine. / 3 doses over 6 months, if 90 and younger. The vaccine is not recommended for use in pregnant females. However, pregnancy testing is not needed before receiving a dose.  Measles, mumps, rubella (MMR) vaccine.** / You need at least 1 dose of MMR if you were born in 1957 or later. You may also need a 2nd dose. For females of childbearing age, rubella immunity should be determined. If there is no evidence of immunity, females who are not  pregnant should be vaccinated. If there is no evidence of immunity, females who are pregnant should delay immunization until after pregnancy.  Pneumococcal 13-valent conjugate (PCV13) vaccine.** / Consult your health care provider.  Pneumococcal polysaccharide (PPSV23) vaccine.** / 1 to 2  doses if you smoke cigarettes or if you have certain conditions.  Meningococcal vaccine.** / 1 dose if you are age 88 to 6 years and a Market researcher living in a residence hall, or have one of several medical conditions, you need to get vaccinated against meningococcal disease. You may also need additional booster doses.  Hepatitis A vaccine.** / Consult your health care provider.  Hepatitis B vaccine.** / Consult your health care provider.  Haemophilus influenzae type b (Hib) vaccine.** / Consult your health care provider. Ages 23 to 64years  Blood pressure check.** / Every 1 to 2 years.  Lipid and cholesterol check.** / Every 5 years beginning at age 20 years.  Lung cancer screening. / Every year if you are aged 51 80 years and have a 30-pack-year history of smoking and currently smoke or have quit within the past 15 years. Yearly screening is stopped once you have quit smoking for at least 15 years or develop a health problem that would prevent you from having lung cancer treatment.  Clinical breast exam.** / Every year after age 8 years.  BRCA-related cancer risk assessment.** / For women who have family members with a BRCA-related cancer (breast, ovarian, tubal, or peritoneal cancers).  Mammogram.** / Every year beginning at age 10 years and continuing for as long as you are in good health. Consult with your health care provider.  Pap test.** / Every 3 years starting at age 30 years through age 5 or 61 years with a history of 3 consecutive normal Pap tests.  HPV screening.** / Every 3 years from ages 39 years through ages 72 to 19 years with a history of 3 consecutive normal Pap tests.  Fecal occult blood test (FOBT) of stool. / Every year beginning at age 59 years and continuing until age 27 years. You may not need to Fils this test if you get a colonoscopy every 10 years.  Flexible sigmoidoscopy or colonoscopy.** / Every 5 years for a flexible sigmoidoscopy or every  10 years for a colonoscopy beginning at age 110 years and continuing until age 63 years.  Hepatitis C blood test.** / For all people born from [birth year] through 1965 and any individual with known risks for hepatitis C.  Skin self-exam. / Monthly.  Influenza vaccine. / Every year.  Tetanus, diphtheria, and acellular pertussis (Tdap/Td) vaccine.** / Consult your health care provider. Pregnant women should receive 1 dose of Tdap vaccine during each pregnancy. 1 dose of Td every 10 years.  Varicella vaccine.** / Consult your health care provider. Pregnant females who Wed[place] not have evidence of immunity should receive the first dose after pregnancy.  Zoster vaccine.** / 1 dose for adults aged 46 years or older.  Measles, mumps, rubella (MMR) vaccine.** / You need at least 1 dose of MMR if you were born in 1957 or later. You may also need a 2nd dose. For females of childbearing age, rubella immunity should be determined. If there is no evidence of immunity, females who are not pregnant should be vaccinated. If there is no evidence of immunity, females who are pregnant should delay immunization until after pregnancy.  Pneumococcal 13-valent conjugate (PCV13) vaccine.** / Consult your health care provider.  Pneumococcal polysaccharide (PPSV23) vaccine.** / 1  to 2 doses if you smoke cigarettes or if you have certain conditions.  Meningococcal vaccine.** / Consult your health care provider.  Hepatitis A vaccine.** / Consult your health care provider.  Hepatitis B vaccine.** / Consult your health care provider.  Haemophilus influenzae type b (Hib) vaccine.** / Consult your health care provider. Ages 32 years and over  Blood pressure check.** / Every 1 to 2 years.  Lipid and cholesterol check.** / Every 5 years beginning at age 53 years.  Lung cancer screening. / Every year if you are aged 77 80 years and have a 30-pack-year history of smoking and currently smoke or have quit within the past 15 years.  Yearly screening is stopped once you have quit smoking for at least 15 years or develop a health problem that would prevent you from having lung cancer treatment.  Clinical breast exam.** / Every year after age 73 years.  BRCA-related cancer risk assessment.** / For women who have family members with a BRCA-related cancer (breast, ovarian, tubal, or peritoneal cancers).  Mammogram.** / Every year beginning at age 17 years and continuing for as long as you are in good health. Consult with your health care provider.  Pap test.** / Every 3 years starting at age 17 years through age 43 or 42 years with 3 consecutive normal Pap tests. Testing can be stopped between 65 and 70 years with 3 consecutive normal Pap tests and no abnormal Pap or HPV tests in the past 10 years.  HPV screening.** / Every 3 years from ages 69 years through ages 59 or 53 years with a history of 3 consecutive normal Pap tests. Testing can be stopped between 65 and 70 years with 3 consecutive normal Pap tests and no abnormal Pap or HPV tests in the past 10 years.  Fecal occult blood test (FOBT) of stool. / Every year beginning at age 71 years and continuing until age 64 years. You may not need to Cong this test if you get a colonoscopy every 10 years.  Flexible sigmoidoscopy or colonoscopy.** / Every 5 years for a flexible sigmoidoscopy or every 10 years for a colonoscopy beginning at age 28 years and continuing until age 25 years.  Hepatitis C blood test.** / For all people born from [birth year] through 1965 and any individual with known risks for hepatitis C.  Osteoporosis screening.** / A one-time screening for women ages 72 years and over and women at risk for fractures or osteoporosis.  Skin self-exam. / Monthly.  Influenza vaccine. / Every year.  Tetanus, diphtheria, and acellular pertussis (Tdap/Td) vaccine.** / 1 dose of Td every 10 years.  Varicella vaccine.** / Consult your health care provider.  Zoster vaccine.** / 1  dose for adults aged 45 years or older.  Pneumococcal 13-valent conjugate (PCV13) vaccine.** / Consult your health care provider.  Pneumococcal polysaccharide (PPSV23) vaccine.** / 1 dose for all adults aged 28 years and older.  Meningococcal vaccine.** / Consult your health care provider.  Hepatitis A vaccine.** / Consult your health care provider.  Hepatitis B vaccine.** / Consult your health care provider.  Haemophilus influenzae type b (Hib) vaccine.** / Consult your health care provider. ** Family history and personal history of risk and conditions may change your health care provider's recommendations. Document Released: 12/16/2001 Document Revised: 08/10/2013 Document Reviewed: 03/17/2011 [facility] Patient Information 2014 [place], [place].

## 2014-03-07 LAB — VARICELLA ZOSTER ANTIBODY, IGG: VARICELLA IGG: 1305 {index} — AB (ref <135.00)

## 2014-03-09 ENCOUNTER — Ambulatory Visit (HOSPITAL_BASED_OUTPATIENT_CLINIC_OR_DEPARTMENT_OTHER)
Admission: RE | Admit: 2014-03-09 | Discharge: 2014-03-09 | Disposition: A | Discharge status: Home / Self Care | Payer: Medicare Other | Source: Ambulatory Visit | Attending: Family Medicine | Admitting: Family Medicine

## 2014-03-09 ENCOUNTER — Ambulatory Visit (HOSPITAL_BASED_OUTPATIENT_CLINIC_OR_DEPARTMENT_OTHER): Payer: Medicare Other

## 2014-03-09 DIAGNOSIS — D259 Leiomyoma of uterus, unspecified: Secondary | ICD-10-CM | POA: Insufficient documentation

## 2014-03-09 DIAGNOSIS — R102 Pelvic and perineal pain: Secondary | ICD-10-CM

## 2014-03-09 DIAGNOSIS — Q6101 Congenital single renal cyst: Secondary | ICD-10-CM | POA: Insufficient documentation

## 2014-03-09 DIAGNOSIS — R109 Unspecified abdominal pain: Secondary | ICD-10-CM

## 2014-03-09 DIAGNOSIS — R1084 Generalized abdominal pain: Secondary | ICD-10-CM | POA: Insufficient documentation

## 2014-03-24 ENCOUNTER — Ambulatory Visit
Admission: RE | Admit: 2014-03-24 | Discharge: 2014-03-24 | Disposition: A | Discharge status: Home / Self Care | Payer: Medicare Other | Source: Ambulatory Visit | Attending: Family Medicine | Admitting: Family Medicine

## 2014-03-24 DIAGNOSIS — E2839 Other primary ovarian failure: Secondary | ICD-10-CM

## 2014-03-24 DIAGNOSIS — Z1231 Encounter for screening mammogram for malignant neoplasm of breast: Secondary | ICD-10-CM

## 2014-03-29 ENCOUNTER — Other Ambulatory Visit: Payer: Self-pay

## 2014-03-29 MED ORDER — ALENDRONATE SODIUM 70 MG PO TABS: 70.0000 mg | ORAL_TABLET | ORAL | Status: DC

## 2014-04-14 ENCOUNTER — Encounter: Payer: Self-pay | Admitting: Family Medicine

## 2014-06-16 ENCOUNTER — Encounter: Payer: Self-pay | Admitting: Gastroenterology

## 2014-12-23 ENCOUNTER — Emergency Department (HOSPITAL_BASED_OUTPATIENT_CLINIC_OR_DEPARTMENT_OTHER)
Admission: EM | Admit: 2014-12-23 | Discharge: 2014-12-23 | Disposition: A | Discharge status: Home / Self Care | Payer: Medicare Other | Attending: Emergency Medicine | Admitting: Emergency Medicine

## 2014-12-23 ENCOUNTER — Encounter (HOSPITAL_BASED_OUTPATIENT_CLINIC_OR_DEPARTMENT_OTHER): Payer: Self-pay | Admitting: *Deleted

## 2014-12-23 DIAGNOSIS — L0100 Impetigo, unspecified: Secondary | ICD-10-CM | POA: Diagnosis not present

## 2014-12-23 DIAGNOSIS — Z7951 Long term (current) use of inhaled steroids: Secondary | ICD-10-CM | POA: Insufficient documentation

## 2014-12-23 DIAGNOSIS — Z79899 Other long term (current) drug therapy: Secondary | ICD-10-CM | POA: Insufficient documentation

## 2014-12-23 DIAGNOSIS — M1990 Unspecified osteoarthritis, unspecified site: Secondary | ICD-10-CM | POA: Insufficient documentation

## 2014-12-23 DIAGNOSIS — R220 Localized swelling, mass and lump, head: Secondary | ICD-10-CM | POA: Diagnosis present

## 2014-12-23 MED ORDER — MUPIROCIN CALCIUM 2 % EX CREA
TOPICAL_CREAM | Freq: Three times a day (TID) | CUTANEOUS | Status: DC
  Administered 2014-12-23: 10:00:00 via TOPICAL
  Filled 2014-12-23: qty 15

## 2014-12-23 NOTE — ED Provider Notes (Signed)
CSN: [id number]     Arrival date & time 12/23/14  0914 History   First MD Initiated Contact with Patient 12/23/14 [phone]     Chief Complaint  Patient presents with  . Oral Swelling     (Consider location/radiation/quality/duration/timing/severity/associated sxs/prior Treatment) HPI History provided by the patient, her husband, via translator phone. Past 2 days patient has had increasing pain, swelling, discoloration about the left lateral lips, upper and lower. No clear precipitant. She has had similar episodes in the past, though nothing is pronounced, nor as long lasting. No concurrent fever, respiratory difficulty. No nausea, vomiting. No other complaints. No relief with Tylenol, topical cold sore medication. No clear exacerbating factors.  Past Medical History  Diagnosis Date  . Arthritis    Past Surgical History  Procedure Laterality Date  . Tubal ligation    . Cesarean section     Family History  Problem Relation Age of Onset  . Asthma Brother    History  Substance Use Topics  . Smoking status: Never Smoker   . Smokeless tobacco: Not on file  . Alcohol Use: No   OB History    No data available     Review of Systems No fever No dyspnea No vomiting No other rash or skin changes. Not immunocompromised    Allergies  Tramadol hcl  Home Medications   Prior to Admission medications   Medication Sig Start Date End Date Taking? Authorizing Provider  acetaminophen (TYLENOL) 325 MG tablet Take 650 mg by mouth every 6 (six) hours as needed.      Historical Provider, MD  alendronate (FOSAMAX) 70 MG tablet Take 1 tablet (70 mg total) by mouth every 7 (seven) days. Take with a full glass of water on an empty stomach. 03/29/14   Rosalita Chessman, [place]  cetirizine (ZYRTEC) 10 MG tablet Take 1 tablet (10 mg total) by mouth daily. 03/06/14   Alferd Apa Lowne, [place]  lidocaine (LIDODERM) 5 % Place 1 patch onto the skin daily. Remove & Discard patch within 12 hours or as directed by  MD 03/02/13   Rosalita Chessman, [place]  mometasone (NASONEX) 50 MCG/ACT nasal spray Place 2 sprays into the nose daily. 03/06/14   Rosalita Chessman, [place]  zoster vaccine live, PF, (ZOSTAVAX) 67341 UNT/0.65ML injection Inject 19,400 Units into the skin once. 03/06/14   Yvonne R Lowne, [place]   BP 158/77 mmHg  Pulse 83  Temp(Src) 98.3 F (36.8 C) (Oral)  Resp 16  SpO2 100% Physical Exam  Constitutional: She is oriented to person, place, and time. She appears well-developed and well-nourished. No distress.  HENT:  Head: Normocephalic and atraumatic.  Mouth/Throat:    Eyes: Conjunctivae and EOM are normal.  Pulmonary/Chest: Effort normal. No stridor. No respiratory distress.  Musculoskeletal: She exhibits no edema.  Lymphadenopathy:  No appreciable cervical adenopathy  Neurological: She is alert and oriented to person, place, and time. No cranial nerve deficit.  Skin: Skin is warm and dry.  Psychiatric: She has a normal mood and affect.  Nursing note and vitals reviewed.   ED Course  Procedures (including critical care time)   MDM   Final diagnoses:  Impetigo    Patient presents with cutaneous and lip lesions.  Patient is afebrile, in no distress, with no evidence for bacteremia or respiratory compromise.  Patient was provided topical antibiotics, discharged in stable condition with primary care follow-up.    Carmin Muskrat, MD 12/23/14 [phone]

## 2014-12-23 NOTE — ED Notes (Addendum)
patient c/o sore on bottom lip for last two days, has been using tylenol and lip balm, but no relief, no fever, no n/v/d

## 2014-12-23 NOTE — Discharge Instructions (Signed)
It is important that you follow-up with your physician to make sure that you are healing appropriately.  Please use the provided medicine three times daily for the next week.  Return here if you get worse.   ?i?u quan tr?ng l b?n theo di v?i bc s? c?a b?n ?? ??m b?o r?ng b?n ?ang ch?a b?nh m?t cch thch h?p.  Vui lng s? d?ng cc lo?i thu?c ???c cung c?p ba l?n m?i ngy trong tu?n ti?p theo.  Quay l?i ?y n?u b?n t?i t? h?n.

## 2015-03-21 DIAGNOSIS — H40033 Anatomical narrow angle, bilateral: Secondary | ICD-10-CM | POA: Diagnosis not present

## 2015-03-21 DIAGNOSIS — H2513 Age-related nuclear cataract, bilateral: Secondary | ICD-10-CM | POA: Diagnosis not present

## 2015-05-12 ENCOUNTER — Other Ambulatory Visit: Payer: Self-pay | Admitting: Family Medicine

## 2015-09-11 ENCOUNTER — Encounter: Payer: Self-pay | Admitting: Family Medicine

## 2015-09-11 ENCOUNTER — Ambulatory Visit (INDEPENDENT_AMBULATORY_CARE_PROVIDER_SITE_OTHER): Payer: Medicare Other | Admitting: Family Medicine

## 2015-09-11 VITALS — BP 156/92 | HR 74 | Temp 98.0°F | Wt 129.4 lb

## 2015-09-11 DIAGNOSIS — R5383 Other fatigue: Secondary | ICD-10-CM | POA: Diagnosis not present

## 2015-09-11 LAB — POCT URINALYSIS DIPSTICK
BILIRUBIN UA: NEGATIVE
Blood, UA: NEGATIVE
GLUCOSE UA: NEGATIVE
Ketones, UA: NEGATIVE
LEUKOCYTES UA: NEGATIVE
NITRITE UA: NEGATIVE
Protein, UA: NEGATIVE
Spec Grav, UA: 1.015
Urobilinogen, UA: 0.2
pH, UA: 7.5

## 2015-09-11 LAB — CBC WITH DIFFERENTIAL/PLATELET
BASOS ABS: 0 10*3/uL (ref 0.0–0.1)
BASOS PCT: 0.4 % (ref 0.0–3.0)
EOS ABS: 0.1 10*3/uL (ref 0.0–0.7)
Eosinophils Relative: 1.8 % (ref 0.0–5.0)
HEMATOCRIT: 40 % (ref 36.0–46.0)
Hemoglobin: 13.3 g/dL (ref 12.0–15.0)
LYMPHS PCT: 25.6 % (ref 12.0–46.0)
Lymphs Abs: 1.7 10*3/uL (ref 0.7–4.0)
MCHC: 33.2 g/dL (ref 30.0–36.0)
MCV: 90.7 fl (ref 78.0–100.0)
MONO ABS: 0.4 10*3/uL (ref 0.1–1.0)
Monocytes Relative: 5.9 % (ref 3.0–12.0)
NEUTROS ABS: 4.3 10*3/uL (ref 1.4–7.7)
NEUTROS PCT: 66.3 % (ref 43.0–77.0)
PLATELETS: 250 10*3/uL (ref 150.0–400.0)
RBC: 4.41 Mil/uL (ref 3.87–5.11)
RDW: 12.7 % (ref 11.5–15.5)
WBC: 6.5 10*3/uL (ref 4.0–10.5)

## 2015-09-11 LAB — COMPREHENSIVE METABOLIC PANEL
ALT: 15 U/L (ref 0–35)
AST: 21 U/L (ref 0–37)
Albumin: 4.8 g/dL (ref 3.5–5.2)
Alkaline Phosphatase: 52 U/L (ref 39–117)
BUN: 10 mg/dL (ref 6–23)
CHLORIDE: 103 meq/L (ref 96–112)
CO2: 29 meq/L (ref 19–32)
CREATININE: 0.64 mg/dL (ref 0.40–1.20)
Calcium: 9.6 mg/dL (ref 8.4–10.5)
GFR: 96.28 mL/min (ref >60.00)
Glucose, Bld: 92 mg/dL (ref 70–99)
Potassium: 4.1 mEq/L (ref 3.5–5.1)
SODIUM: 141 meq/L (ref 135–145)
Total Bilirubin: 1 mg/dL (ref 0.2–1.2)
Total Protein: 8.1 g/dL (ref 6.0–8.3)

## 2015-09-11 LAB — LIPID PANEL
Cholesterol: 199 mg/dL (ref 0–200)
HDL: 42.9 mg/dL (ref >39.00)
LDL Cholesterol: 117 mg/dL — ABNORMAL HIGH (ref 0–99)
NonHDL: 156.1
TRIGLYCERIDES: 196 mg/dL — AB (ref 0.0–149.0)
Total CHOL/HDL Ratio: 5
VLDL: 39.2 mg/dL (ref 0.0–40.0)

## 2015-09-11 LAB — TSH: TSH: 1.93 u[IU]/mL (ref 0.35–4.50)

## 2015-09-11 LAB — VITAMIN B12: VITAMIN B 12: 282 pg/mL (ref 211–911)

## 2015-09-11 NOTE — Progress Notes (Signed)
Patient ID: Ariel Williams, female    DOB: 1941-07-21  Age: 74 y.o. MRN: [id number]    Subjective:  Subjective HPI Ariel Williams presents for extreme fatigue and weakness x several months.   Pt is here today for blood work to see why she is tired.  She has been traveling over the last month --  [place] and [place].  Translator is on the phone.    Review of Systems  Constitutional: Positive for fatigue. Negative for diaphoresis, appetite change and unexpected weight change.  Eyes: Negative for pain, redness and visual disturbance.  Respiratory: Negative for cough, chest tightness, shortness of breath and wheezing.   Cardiovascular: Negative for chest pain, palpitations and leg swelling.  Endocrine: Negative for cold intolerance, heat intolerance, polydipsia, polyphagia and polyuria.  Genitourinary: Negative for dysuria, frequency and difficulty urinating.  Neurological: Negative for dizziness, light-headedness, numbness and headaches.    History Past Medical History  Diagnosis Date  . Arthritis     She has past surgical history that includes Tubal ligation and Cesarean section.   Her family history includes Asthma in her brother.She reports that she has never smoked. She does not have any smokeless tobacco history on file. She reports that she does not drink alcohol or use illicit drugs.  Current Outpatient Prescriptions on File Prior to Visit  Medication Sig Dispense Refill  . alendronate (FOSAMAX) 70 MG tablet TAKE 1 TABLET BY MOUTH EVERY 7 DAYS WITH A FULL GLASS OF WATER ON AN EMPTY STOMACH (Patient not taking: Reported on 09/11/2015) 4 tablet 0  . cetirizine (ZYRTEC) 10 MG tablet Take 1 tablet (10 mg total) by mouth daily. (Patient not taking: Reported on 09/11/2015) 30 tablet 11  . lidocaine (LIDODERM) 5 % Place 1 patch onto the skin daily. Remove & Discard patch within 12 hours or as directed by MD (Patient not taking: Reported on 09/11/2015) 30 patch 1  . mometasone (NASONEX) 50 MCG/ACT  nasal spray Place 2 sprays into the nose daily. (Patient not taking: Reported on 09/11/2015) 17 g 11   No current facility-administered medications on file prior to visit.     Objective:  Objective Physical Exam  Constitutional: She is oriented to person, place, and time. She appears well-developed and well-nourished.  HENT:  Head: Normocephalic and atraumatic.  Eyes: Conjunctivae and EOM are normal.  Neck: Normal range of motion. Neck supple. No JVD present. Carotid bruit is not present. No thyromegaly present.  Cardiovascular: Normal rate, regular rhythm and normal heart sounds.   No murmur heard. Pulmonary/Chest: Effort normal and breath sounds normal. No respiratory distress. She has no wheezes. She has no rales. She exhibits no tenderness.  Musculoskeletal: She exhibits no edema.  Neurological: She is alert and oriented to person, place, and time.  Psychiatric: She has a normal mood and affect. Her behavior is normal.  Nursing note and vitals reviewed.  BP 156/92 mmHg  Pulse 74  Temp(Src) 98 F (36.7 C) (Oral)  Wt 129 lb 6.4 oz (58.695 kg)  SpO2 98% Wt Readings from Last 3 Encounters:  09/11/15 129 lb 6.4 oz (58.695 kg)  03/06/14 128 lb (58.06 kg)  03/02/13 128 lb (58.06 kg)     Lab Results  Component Value Date   WBC 6.5 09/11/2015   HGB 13.3 09/11/2015   HCT 40.0 09/11/2015   PLT 250.0 09/11/2015   GLUCOSE 92 09/11/2015   CHOL 199 09/11/2015   TRIG 196.0* 09/11/2015   HDL 42.90 09/11/2015   LDLDIRECT 140.0 03/02/2013  LDLCALC 117* 09/11/2015   ALT 15 09/11/2015   AST 21 09/11/2015   NA 141 09/11/2015   K 4.1 09/11/2015   CL 103 09/11/2015   CREATININE 0.64 09/11/2015   BUN 10 09/11/2015   CO2 29 09/11/2015   TSH 1.93 09/11/2015    No results found.   Assessment & Plan:  Plan I have discontinued Ariel Williams's acetaminophen and zoster vaccine live (PF). I am also having her maintain her lidocaine, mometasone, cetirizine, alendronate, and  diphenhydramine-acetaminophen.  Meds ordered this encounter  Medications  . diphenhydramine-acetaminophen (TYLENOL PM) 25-500 MG TABS tablet    Sig: Take 1 tablet by mouth at bedtime as needed.    Problem List Items Addressed This Visit    None    Visit Diagnoses    Other fatigue    -  Primary    Relevant Orders    Comp Met (CMET) (Completed)    CBC with Differential/Platelet (Completed)    Lipid panel (Completed)    POCT urinalysis dipstick (Completed)    TSH (Completed)    Vitamin B12 (Completed)       Follow-up: Return if symptoms worsen or fail to improve.  Garnet Koyanagi, [place]

## 2015-09-11 NOTE — Progress Notes (Signed)
Pre visit review using our clinic review tool, if applicable. No additional management support is needed unless otherwise documented below in the visit note. 

## 2015-09-11 NOTE — Patient Instructions (Signed)
Fatigue  Fatigue is feeling tired all of the time, a lack of energy, or a lack of motivation. Occasional or mild fatigue is often a normal response to activity or life in general. However, long-lasting (chronic) or extreme fatigue may indicate an underlying medical condition.  HOME CARE INSTRUCTIONS   Watch your fatigue for any changes. The following actions may help to lessen any discomfort you are feeling:  · Talk to your health care provider about how much sleep you need each night. Try to get the required amount every night.  · Take medicines only as directed by your health care provider.  · Eat a healthy and nutritious diet. Ask your health care provider if you need help changing your diet.  · Drink enough fluid to keep your urine clear or pale yellow.  · Practice ways of relaxing, such as yoga, meditation, massage therapy, or acupuncture.  · Exercise regularly.    · Change situations that cause you stress. Try to keep your work and personal routine reasonable.  · Thong not abuse illegal drugs.  · Limit alcohol intake to no more than 1 drink per day for nonpregnant women and 2 drinks per day for men. One drink equals 12 ounces of beer, 5 ounces of wine, or 1½ ounces of hard liquor.  · Take a multivitamin, if directed by your health care provider.  SEEK MEDICAL CARE IF:   · Your fatigue does not get better.  · You have a fever.    · You have unintentional weight loss or gain.  · You have headaches.    · You have difficulty:      Falling asleep.    Sleeping throughout the night.  · You feel angry, guilty, anxious, or sad.     · You are unable to have a bowel movement (constipation).    · You skin is dry.     · Your legs or another part of your body is swollen.    SEEK IMMEDIATE MEDICAL CARE IF:   · You feel confused.    · Your vision is blurry.  · You feel faint or pass out.    · You have a severe headache.    · You have severe abdominal, pelvic, or back pain.    · You have chest pain, shortness of breath, or an  irregular or fast heartbeat.    · You are unable to urinate or you urinate less than normal.    · You develop abnormal bleeding, such as bleeding from the rectum, vagina, nose, lungs, or nipples.  · You vomit blood.     · You have thoughts about harming yourself or committing suicide.    · You are worried that you might harm someone else.       This information is not intended to replace advice given to you by your health care provider. Make sure you discuss any questions you have with your health care provider.     Document Released: 08/17/2007 Document Revised: 11/10/2014 Document Reviewed: 02/21/2014  Elsevier Interactive Patient Education ©2016 Elsevier Inc.

## 2015-09-11 NOTE — Assessment & Plan Note (Signed)
Probably secondary to all the traveling she has been doing Check labs F/u as scheduled

## 2015-11-22 ENCOUNTER — Ambulatory Visit (INDEPENDENT_AMBULATORY_CARE_PROVIDER_SITE_OTHER): Payer: Medicare Other

## 2015-11-22 DIAGNOSIS — Z23 Encounter for immunization: Secondary | ICD-10-CM | POA: Diagnosis not present

## 2016-01-14 ENCOUNTER — Telehealth: Payer: Self-pay | Admitting: Family Medicine

## 2016-01-14 ENCOUNTER — Encounter: Payer: Self-pay | Admitting: Family Medicine

## 2016-01-14 ENCOUNTER — Ambulatory Visit (INDEPENDENT_AMBULATORY_CARE_PROVIDER_SITE_OTHER): Payer: Medicare Other | Admitting: Family Medicine

## 2016-01-14 VITALS — BP 138/84 | HR 76 | Temp 98.1°F | Wt 131.8 lb

## 2016-01-14 DIAGNOSIS — R1084 Generalized abdominal pain: Secondary | ICD-10-CM

## 2016-01-14 DIAGNOSIS — Z1231 Encounter for screening mammogram for malignant neoplasm of breast: Secondary | ICD-10-CM

## 2016-01-14 DIAGNOSIS — R1013 Epigastric pain: Secondary | ICD-10-CM

## 2016-01-14 DIAGNOSIS — M810 Age-related osteoporosis without current pathological fracture: Secondary | ICD-10-CM | POA: Diagnosis not present

## 2016-01-14 DIAGNOSIS — B9681 Helicobacter pylori [H. pylori] as the cause of diseases classified elsewhere: Secondary | ICD-10-CM

## 2016-01-14 DIAGNOSIS — A048 Other specified bacterial intestinal infections: Secondary | ICD-10-CM

## 2016-01-14 LAB — POCT URINALYSIS DIPSTICK
Bilirubin, UA: NEGATIVE
Glucose, UA: NEGATIVE
KETONES UA: NEGATIVE
Leukocytes, UA: NEGATIVE
Nitrite, UA: NEGATIVE
PROTEIN UA: NEGATIVE
RBC UA: NEGATIVE
SPEC GRAV UA: 1.025
Urobilinogen, UA: 0.2
pH, UA: 6

## 2016-01-14 MED ORDER — RANITIDINE HCL 150 MG PO TABS: 150.0000 mg | ORAL_TABLET | Freq: Two times a day (BID) | ORAL | Status: DC

## 2016-01-14 NOTE — Telephone Encounter (Signed)
Patient Name: Ariel Williams  DOB: July 26, 1941    Initial Comment Caller states, has a pain from chest to side and into back, shortness of breath    Nurse Assessment  Nurse: Verlin Fester RN, Stanton Kidney Date/Time (Eastern Time): 01/14/2016 10:13:34 AM  Confirm and document reason for call. If symptomatic, describe symptoms. You must click the next button to save text entered. ---Caller states patient is having chest pain that radiates into the back and shortness of breath  Has the patient traveled out of the country within the last 30 days? ---No  Does the patient have any new or worsening symptoms? ---Yes  Will a triage be completed? ---No  Select reason for no triage. ---Other  Please document clinical information provided and list any resource used. ---Caller is not with patient and can not tell me if she is having trouble breathing, etc. Asked her to have patient call us back and we can get an interpreter     Guidelines    Guideline Title Affirmed Question Affirmed Notes       Final Disposition User        Comments  Instructed caller that if she is having chest pain or any trouble breathing she needs to call 911.

## 2016-01-14 NOTE — Progress Notes (Signed)
Patient ID: Ariel Williams, female    DOB: 11-Apr-1941  Age: 75 y.o. MRN: [id number]    Subjective:  Subjectivec/ HPI Ariel Williams presents for c/o mid epigastric and R UQ pain that wraps around to R shoulder blade and down R side and hip.  Translator is present.  Fatty foods seem to make it worse.    Review of Systems  Constitutional: Negative for diaphoresis, appetite change, fatigue and unexpected weight change.  Eyes: Negative for pain, redness and visual disturbance.  Respiratory: Negative for cough, chest tightness, shortness of breath and wheezing.   Cardiovascular: Negative for chest pain, palpitations and leg swelling.  Gastrointestinal: Positive for abdominal pain. Negative for nausea, vomiting, diarrhea, constipation, blood in stool and rectal pain.  Endocrine: Negative for cold intolerance, heat intolerance, polydipsia, polyphagia and polyuria.  Genitourinary: Negative for dysuria, frequency and difficulty urinating.  Neurological: Negative for dizziness, light-headedness, numbness and headaches.    History Past Medical History  Diagnosis Date  . Arthritis     She has past surgical history that includes Tubal ligation and Cesarean section.   Her family history includes Asthma in her brother.She reports that she has never smoked. She does not have any smokeless tobacco history on file. She reports that she does not drink alcohol or use illicit drugs.  Current Outpatient Prescriptions on File Prior to Visit  Medication Sig Dispense Refill  . diphenhydramine-acetaminophen (TYLENOL PM) 25-500 MG TABS tablet Take 1 tablet by mouth at bedtime as needed.    . lidocaine (LIDODERM) 5 % Place 1 patch onto the skin daily. Remove & Discard patch within 12 hours or as directed by MD 30 patch 1  . alendronate (FOSAMAX) 70 MG tablet TAKE 1 TABLET BY MOUTH EVERY 7 DAYS WITH A FULL GLASS OF WATER ON AN EMPTY STOMACH (Patient not taking: Reported on 09/11/2015) 4 tablet 0  . cetirizine (ZYRTEC) 10 MG  tablet Take 1 tablet (10 mg total) by mouth daily. (Patient not taking: Reported on 09/11/2015) 30 tablet 11  . mometasone (NASONEX) 50 MCG/ACT nasal spray Place 2 sprays into the nose daily. (Patient not taking: Reported on 09/11/2015) 17 g 11   No current facility-administered medications on file prior to visit.     Objective:  Objective Physical Exam  Constitutional: She is oriented to person, place, and time. She appears well-developed and well-nourished.  HENT:  Head: Normocephalic and atraumatic.  Eyes: Conjunctivae and EOM are normal.  Neck: Normal range of motion. Neck supple. No JVD present. Carotid bruit is not present. No thyromegaly present.  Cardiovascular: Normal rate, regular rhythm and normal heart sounds.   No murmur heard. Pulmonary/Chest: Effort normal and breath sounds normal. No respiratory distress. She has no wheezes. She has no rales. She exhibits no tenderness.  Abdominal: Soft. Bowel sounds are normal. She exhibits no distension and no mass. There is no tenderness. There is no rebound and no guarding.  Musculoskeletal: She exhibits no edema.  Neurological: She is alert and oriented to person, place, and time.  Psychiatric: She has a normal mood and affect.  Vitals reviewed.  BP 138/84 mmHg  Pulse 76  Temp(Src) 98.1 F (36.7 C) (Oral)  Wt 131 lb 12.8 oz (59.784 kg)  SpO2 98% Wt Readings from Last 3 Encounters:  01/14/16 131 lb 12.8 oz (59.784 kg)  09/11/15 129 lb 6.4 oz (58.695 kg)  03/06/14 128 lb (58.06 kg)     Lab Results  Component Value Date   WBC 6.5 09/11/2015  HGB 13.3 09/11/2015   HCT 40.0 09/11/2015   PLT 250.0 09/11/2015   GLUCOSE 92 09/11/2015   CHOL 199 09/11/2015   TRIG 196.0* 09/11/2015   HDL 42.90 09/11/2015   LDLDIRECT 140.0 03/02/2013   LDLCALC 117* 09/11/2015   ALT 15 09/11/2015   AST 21 09/11/2015   NA 141 09/11/2015   K 4.1 09/11/2015   CL 103 09/11/2015   CREATININE 0.64 09/11/2015   BUN 10 09/11/2015   CO2 29  09/11/2015   TSH 1.93 09/11/2015    No results found.   Assessment & Plan:  Plan I am having Ms. Cliett start on ranitidine. I am also having her maintain her lidocaine, mometasone, cetirizine, alendronate, and diphenhydramine-acetaminophen.  Meds ordered this encounter  Medications  . ranitidine (ZANTAC) 150 MG tablet    Sig: Take 1 tablet (150 mg total) by mouth 2 (two) times daily.    Dispense:  60 tablet    Refill:  5    Problem List Items Addressed This Visit    None    Visit Diagnoses    Osteoporosis    -  Primary    Relevant Orders    DG Bone Density    Encounter for screening mammogram for breast cancer        Relevant Orders    MM DIGITAL SCREENING BILATERAL    Generalized abdominal pain        Relevant Orders    Comp Met (CMET)    CBC with Differential/Platelet    H. pylori antibody, IgG    POCT urinalysis dipstick (Completed)    US Abdomen Complete    Dyspepsia        Relevant Medications    ranitidine (ZANTAC) 150 MG tablet       Follow-up: Return if symptoms worsen or fail to improve.  Ariel Williams

## 2016-01-14 NOTE — Telephone Encounter (Signed)
Pt's friend called in to help schedule pt an appt. She says that pt is complaining about some chest discomfort and also pain in her side and back. Scheduled pt to come in to be seen by PCP. ALSO transferred pt to [facility] due to concern mentioned.

## 2016-01-14 NOTE — Telephone Encounter (Signed)
Pt seen by Dr. Etter Sjogren today (01/14/16).

## 2016-01-14 NOTE — Progress Notes (Signed)
Pre visit review using our clinic review tool, if applicable. No additional management support is needed unless otherwise documented below in the visit note. 

## 2016-01-14 NOTE — Patient Instructions (Signed)
Food Choices for Gastroesophageal Reflux Disease, Adult When you have gastroesophageal reflux disease (GERD), the foods you eat and your eating habits are very important. Choosing the right foods can help ease the discomfort of GERD. WHAT GENERAL GUIDELINES [place] I NEED TO FOLLOW?  Choose fruits, vegetables, whole grains, low-fat dairy products, and low-fat meat, fish, and poultry.  Limit fats such as oils, salad dressings, butter, nuts, and avocado.  Keep a food diary to identify foods that cause symptoms.  Avoid foods that cause reflux. These may be different for different people.  Eat frequent small meals instead of three large meals each day.  Eat your meals slowly, in a relaxed setting.  Limit fried foods.  Cook foods using methods other than frying.  Avoid drinking alcohol.  Avoid drinking large amounts of liquids with your meals.  Avoid bending over or lying down until 2-3 hours after eating. WHAT FOODS ARE NOT RECOMMENDED? The following are some foods and drinks that may worsen your symptoms: Vegetables Tomatoes. Tomato juice. Tomato and spaghetti sauce. Chili peppers. Onion and garlic. Horseradish. Fruits Oranges, grapefruit, and lemon (fruit and juice). Meats High-fat meats, fish, and poultry. This includes hot dogs, ribs, ham, sausage, salami, and bacon. Dairy Whole milk and chocolate milk. Sour cream. Cream. Butter. Ice cream. Cream cheese.  Beverages Coffee and tea, with or without caffeine. Carbonated beverages or energy drinks. Condiments Hot sauce. Barbecue sauce.  Sweets/Desserts Chocolate and cocoa. Donuts. Peppermint and spearmint. Fats and Oils High-fat foods, including French fries and potato chips. Other Vinegar. Strong spices, such as black pepper, white pepper, red pepper, cayenne, curry powder, cloves, ginger, and chili powder. The items listed above may not be a complete list of foods and beverages to avoid. Contact your dietitian for more  information.   This information is not intended to replace advice given to you by your health care provider. Make sure you discuss any questions you have with your health care provider.   Document Released: 10/20/2005 Document Revised: 11/10/2014 Document Reviewed: 08/24/2013 Elsevier Interactive Patient Education 2016 Elsevier Inc.  

## 2016-01-15 LAB — CBC WITH DIFFERENTIAL/PLATELET
Basophils Absolute: 0 10*3/uL (ref 0.0–0.1)
Basophils Relative: 0.3 % (ref 0.0–3.0)
EOS PCT: 1.5 % (ref 0.0–5.0)
Eosinophils Absolute: 0.1 10*3/uL (ref 0.0–0.7)
HCT: 37.9 % (ref 36.0–46.0)
HEMOGLOBIN: 12.8 g/dL (ref 12.0–15.0)
Lymphocytes Relative: 24.7 % (ref 12.0–46.0)
Lymphs Abs: 1.8 10*3/uL (ref 0.7–4.0)
MCHC: 33.9 g/dL (ref 30.0–36.0)
MCV: 89.9 fl (ref 78.0–100.0)
MONOS PCT: 5.9 % (ref 3.0–12.0)
Monocytes Absolute: 0.4 10*3/uL (ref 0.1–1.0)
Neutro Abs: 4.9 10*3/uL (ref 1.4–7.7)
Neutrophils Relative %: 67.6 % (ref 43.0–77.0)
Platelets: 246 10*3/uL (ref 150.0–400.0)
RBC: 4.22 Mil/uL (ref 3.87–5.11)
RDW: 12.7 % (ref 11.5–15.5)
WBC: 7.3 10*3/uL (ref 4.0–10.5)

## 2016-01-15 LAB — COMPREHENSIVE METABOLIC PANEL
ALBUMIN: 4.8 g/dL (ref 3.5–5.2)
ALK PHOS: 52 U/L (ref 39–117)
ALT: 12 U/L (ref 0–35)
AST: 19 U/L (ref 0–37)
BUN: 15 mg/dL (ref 6–23)
CHLORIDE: 103 meq/L (ref 96–112)
CO2: 28 mEq/L (ref 19–32)
Calcium: 9.6 mg/dL (ref 8.4–10.5)
Creatinine, Ser: 0.71 mg/dL (ref 0.40–1.20)
GFR: 85.33 mL/min (ref >60.00)
Glucose, Bld: 110 mg/dL — ABNORMAL HIGH (ref 70–99)
POTASSIUM: 4.1 meq/L (ref 3.5–5.1)
Sodium: 140 mEq/L (ref 135–145)
TOTAL PROTEIN: 7.8 g/dL (ref 6.0–8.3)
Total Bilirubin: 0.7 mg/dL (ref 0.2–1.2)

## 2016-01-15 LAB — H. PYLORI ANTIBODY, IGG: H Pylori IgG: POSITIVE — AB

## 2016-01-16 ENCOUNTER — Other Ambulatory Visit: Payer: Self-pay

## 2016-01-16 MED ORDER — CLARITHROMYCIN 500 MG PO TABS: 500.0000 mg | ORAL_TABLET | Freq: Two times a day (BID) | ORAL | Status: DC

## 2016-01-16 MED ORDER — ESOMEPRAZOLE MAGNESIUM 40 MG PO CPDR: 40.0000 mg | DELAYED_RELEASE_CAPSULE | Freq: Every day | ORAL | Status: DC

## 2016-01-16 MED ORDER — AMOXICILLIN 500 MG PO CAPS: 500.0000 mg | ORAL_CAPSULE | Freq: Two times a day (BID) | ORAL | Status: DC

## 2016-01-16 NOTE — Addendum Note (Signed)
Addended by: Ricky Ala on: 01/16/2016 08:44 AM   Modules accepted: Orders

## 2016-01-17 ENCOUNTER — Ambulatory Visit (HOSPITAL_BASED_OUTPATIENT_CLINIC_OR_DEPARTMENT_OTHER)
Admission: RE | Admit: 2016-01-17 | Discharge: 2016-01-17 | Disposition: A | Discharge status: Home / Self Care | Payer: Medicare Other | Source: Ambulatory Visit | Attending: Family Medicine | Admitting: Family Medicine

## 2016-01-17 DIAGNOSIS — R1084 Generalized abdominal pain: Secondary | ICD-10-CM | POA: Insufficient documentation

## 2016-01-17 DIAGNOSIS — N281 Cyst of kidney, acquired: Secondary | ICD-10-CM | POA: Insufficient documentation

## 2016-01-22 ENCOUNTER — Ambulatory Visit: Payer: Medicare Other | Admitting: Gastroenterology

## 2016-01-22 ENCOUNTER — Telehealth: Payer: Self-pay | Admitting: Family Medicine

## 2016-01-22 NOTE — Telephone Encounter (Signed)
Pt's interpreter called in to inform PCP that  pt had an appt with GI today. Pt missed appt due to her spouse getting lost and not being sure of location. Pt would like to know if another appt can be scheduled for her as soon as possible considering her current stomach pain. Provided referral information.     CB: [phone]

## 2016-01-23 NOTE — Telephone Encounter (Signed)
Another appointment has already been made, see appointment desk

## 2016-01-24 ENCOUNTER — Telehealth: Payer: Self-pay | Admitting: Family Medicine

## 2016-01-24 NOTE — Telephone Encounter (Signed)
This pt should have been scheduled for 30 minutes. And in end may send to ED. So if she come in late may go ahead and send to ED. Will need to see what is going on. If she comes on time go ahead and get ekg.

## 2016-01-24 NOTE — Telephone Encounter (Signed)
Pt has an appt with Mackie Pai, PA-C tomorrow (01/25/16) at 9:15 am.  Message routed to [facility] for [place].

## 2016-01-24 NOTE — Telephone Encounter (Signed)
Caller name: Delsa Sale Relationship to patient: Daughter in law Can be reached: [phone]  Reason for call: Pts daughter in law called. Pt was not feeling well and called her. She is at pt home. Pt feeling very fatigued. Transferred call to [facility] with [facility] [employer].

## 2016-01-24 NOTE — Telephone Encounter (Signed)
[facility] - Client [facility]  Patient Name: Ariel Williams  DOB: 08/29/1941    Initial Comment Caller states her mother-in law is fatigued, her chest feels heavy, chest burning, feet are really cold to touch, very weak beginning this morning. This has gotten progressively worse as the day goes by.   Nurse Assessment  Nurse: Christel Mormon, RN, Levada Dy Date/Time Eilene Ghazi Time): 01/24/2016 4:15:52 PM  Confirm and document reason for call. If symptomatic, describe symptoms. You must click the next button to save text entered. ---Caller states her mother-in law is fatigued, her chest feels heavy, chest burning, feet are really cold to touch. She started getting very weak beginning this morning. This has gotten progressively worse as of 3pm. She is able to walk. She continues to have chest pain a "little bit".  Has the patient traveled out of the country within the last 30 days? ---No  Does the patient have any new or worsening symptoms? ---Yes  Will a triage be completed? ---Yes  Related visit to physician within the last 2 weeks? ---No  Does the PT have any chronic conditions? (i.e. diabetes, asthma, etc.) ---Yes  List chronic conditions. ---Osteoporosis, GERD, Fatigue  Is this a behavioral health or substance abuse call? ---No     Guidelines    Guideline Title Affirmed Question Affirmed Notes  Chest Pain [1] Chest pain lasts > 5 minutes AND [2] occurred > 3 days ago (72 hours) AND [3] NO chest pain or cardiac symptoms now    Final Disposition User   See Physician within [place], RN, Photographer    Referrals  REFERRED TO PCP OFFICE   Disagree/Comply: Leta Baptist

## 2016-01-25 ENCOUNTER — Encounter: Payer: Self-pay | Admitting: Medical

## 2016-01-25 ENCOUNTER — Ambulatory Visit (INDEPENDENT_AMBULATORY_CARE_PROVIDER_SITE_OTHER): Payer: Medicare Other | Admitting: Medical

## 2016-01-25 ENCOUNTER — Encounter (HOSPITAL_BASED_OUTPATIENT_CLINIC_OR_DEPARTMENT_OTHER): Payer: Self-pay | Admitting: *Deleted

## 2016-01-25 ENCOUNTER — Emergency Department (HOSPITAL_BASED_OUTPATIENT_CLINIC_OR_DEPARTMENT_OTHER): Payer: Medicare Other

## 2016-01-25 ENCOUNTER — Emergency Department (HOSPITAL_BASED_OUTPATIENT_CLINIC_OR_DEPARTMENT_OTHER)
Admission: EM | Admit: 2016-01-25 | Discharge: 2016-01-25 | Disposition: A | Discharge status: Home / Self Care | Payer: Medicare Other | Attending: Emergency Medicine | Admitting: Emergency Medicine

## 2016-01-25 VITALS — BP 124/82 | HR 92 | Temp 98.1°F | Ht 59.5 in | Wt 125.4 lb

## 2016-01-25 DIAGNOSIS — R0602 Shortness of breath: Secondary | ICD-10-CM | POA: Diagnosis not present

## 2016-01-25 DIAGNOSIS — Z8739 Personal history of other diseases of the musculoskeletal system and connective tissue: Secondary | ICD-10-CM | POA: Diagnosis not present

## 2016-01-25 DIAGNOSIS — R42 Dizziness and giddiness: Secondary | ICD-10-CM | POA: Diagnosis not present

## 2016-01-25 DIAGNOSIS — Z792 Long term (current) use of antibiotics: Secondary | ICD-10-CM | POA: Insufficient documentation

## 2016-01-25 DIAGNOSIS — Z7951 Long term (current) use of inhaled steroids: Secondary | ICD-10-CM | POA: Insufficient documentation

## 2016-01-25 DIAGNOSIS — Z79899 Other long term (current) drug therapy: Secondary | ICD-10-CM | POA: Diagnosis not present

## 2016-01-25 DIAGNOSIS — R5383 Other fatigue: Secondary | ICD-10-CM | POA: Insufficient documentation

## 2016-01-25 DIAGNOSIS — R079 Chest pain, unspecified: Secondary | ICD-10-CM

## 2016-01-25 DIAGNOSIS — R0789 Other chest pain: Secondary | ICD-10-CM | POA: Diagnosis not present

## 2016-01-25 LAB — CBC WITH DIFFERENTIAL/PLATELET
Basophils Absolute: 0 10*3/uL (ref 0.0–0.1)
Basophils Relative: 0 %
EOS ABS: 0 10*3/uL (ref 0.0–0.7)
EOS PCT: 1 %
HCT: 42.3 % (ref 36.0–46.0)
Hemoglobin: 14.3 g/dL (ref 12.0–15.0)
LYMPHS ABS: 1.8 10*3/uL (ref 0.7–4.0)
Lymphocytes Relative: 27 %
MCH: 30.5 pg (ref 26.0–34.0)
MCHC: 33.8 g/dL (ref 30.0–36.0)
MCV: 90.2 fL (ref 78.0–100.0)
Monocytes Absolute: 0.5 10*3/uL (ref 0.1–1.0)
Monocytes Relative: 7 %
Neutro Abs: 4.3 10*3/uL (ref 1.7–7.7)
Neutrophils Relative %: 65 %
Platelets: 239 10*3/uL (ref 150–400)
RBC: 4.69 MIL/uL (ref 3.87–5.11)
RDW: 11.9 % (ref 11.5–15.5)
WBC: 6.6 10*3/uL (ref 4.0–10.5)

## 2016-01-25 LAB — BASIC METABOLIC PANEL
Anion gap: 10 (ref 5–15)
BUN: 13 mg/dL (ref 6–20)
CHLORIDE: 107 mmol/L (ref 101–111)
CO2: 23 mmol/L (ref 22–32)
CREATININE: 0.77 mg/dL (ref 0.44–1.00)
Calcium: 9.1 mg/dL (ref 8.9–10.3)
GFR calc Af Amer: >60 mL/min (ref >60)
GFR calc non Af Amer: >60 mL/min (ref >60)
GLUCOSE: 88 mg/dL (ref 65–99)
Potassium: 3.9 mmol/L (ref 3.5–5.1)
SODIUM: 140 mmol/L (ref 135–145)

## 2016-01-25 LAB — TROPONIN I

## 2016-01-25 NOTE — Progress Notes (Signed)
Pre visit review using our clinic review tool, if applicable. No additional management support is needed unless otherwise documented below in the visit note. 

## 2016-01-25 NOTE — ED Notes (Signed)
C/o stomach virus on 3/15 after taking medicine for stomach virus prescribed (clarithromycin, esomeprazole magnesium, amoxicillin, and zantac)  by primary MD, c/p started yesterday.C/o mid sternum c/p that goes to back at times and feels tired.

## 2016-01-25 NOTE — Progress Notes (Signed)
Subjective:    Patient ID: Ariel Williams, female    DOB: 21-Nov-1940, 75 y.o.   MRN: [id number]  HPI  Pt in reporting she had some chest area burning and with chest pain felt short of breath.(chest pain lasted 10 minutes approximate)  After shortness of breath and chest pain she reports fatigue. Worse than any previous fatigue.  At times she had chest pain, no jaw pain. But felt mild shortness of breath. She feels more tired/fatigued after the chest pain. Pt his mild high triglycerides and mild hdl elevation. No hx of smoking. Not diabetic. No htn.  Also reports that her feet and hand felt cold yesterday.  Pt does have history of some likley gerd. Given some zantc with refills on her last visit.  Pt also started some antibiotic about 10 days ago for h pylori.   Pt also getting some dizziness. This is occuring rare and transient dizziness.  No leg pain.   Review of Systems  Constitutional: Positive for fatigue. Negative for fever and chills.  Respiratory: Positive for shortness of breath. Negative for cough and choking.        Only transient yesterday.  Cardiovascular: Positive for chest pain.       Yesterday for 5-10 minutes before lunch.  Gastrointestinal: Negative for nausea, abdominal pain, diarrhea, constipation, blood in stool and abdominal distention.  Musculoskeletal: Negative for back pain.  Skin: Negative for pallor and rash.  Hematological: Negative for adenopathy. Does not bruise/bleed easily.  Psychiatric/Behavioral: Negative for behavioral problems and confusion.   Past Medical History  Diagnosis Date  . Arthritis     Social History   Social History  . Marital Status: Married    Spouse Name: N/A  . Number of Children: N/A  . Years of Education: N/A   Occupational History  . Not on file.   Social History Main Topics  . Smoking status: Never Smoker   . Smokeless tobacco: Not on file  . Alcohol Use: No  . Drug Use: No  . Sexual Activity: Not on file    Other Topics Concern  . Not on file   Social History Narrative   From [name] been 6 years lives with husband    Past Surgical History  Procedure Laterality Date  . Tubal ligation    . Cesarean section      Family History  Problem Relation Age of Onset  . Asthma Brother     Allergies  Allergen Reactions  . Tramadol Hcl     REACTION: blurred vision    Current Outpatient Prescriptions on File Prior to Visit  Medication Sig Dispense Refill  . amoxicillin (AMOXIL) 500 MG capsule Take 1 capsule (500 mg total) by mouth 2 (two) times daily. X 14 days 28 capsule 0  . clarithromycin (BIAXIN) 500 MG tablet Take 1 tablet (500 mg total) by mouth 2 (two) times daily. 28 tablet 0  . diphenhydramine-acetaminophen (TYLENOL PM) 25-500 MG TABS tablet Take 1 tablet by mouth at bedtime as needed.    Marland Kitchen esomeprazole (NEXIUM) 40 MG capsule Take 1 capsule (40 mg total) by mouth daily. x14 days 20 capsule 0  . lidocaine (LIDODERM) 5 % Place 1 patch onto the skin daily. Remove & Discard patch within 12 hours or as directed by MD 30 patch 1  . mometasone (NASONEX) 50 MCG/ACT nasal spray Place 2 sprays into the nose daily. 17 g 11  . ranitidine (ZANTAC) 150 MG tablet Take 1 tablet (150 mg total) by  mouth 2 (two) times daily. 60 tablet 5   No current facility-administered medications on file prior to visit.    BP 124/82 mmHg  Pulse 92  Temp(Src) 98.1 F (36.7 C) (Oral)  Ht 4' 11.5" (1.511 m)  Wt 125 lb 6.4 oz (56.881 kg)  BMI 24.91 kg/m2  SpO2 98%       Objective:   Physical Exam  General Mental Status- Alert. General Appearance- Not in acute distress.   Skin General: Color- Normal Color. Moisture- Normal Moisture.  Neck Carotid Arteries- Normal color. Moisture- Normal Moisture. No carotid bruits. No JVD.  Chest and Lung Exam Auscultation: Breath Sounds:-Normal.  Cardiovascular Auscultation:Rythm- Regular. Murmurs & Other Heart Sounds:Auscultation of the heart reveals- No  Murmurs.  Abdomen Inspection:-Inspeection Normal. Palpation/Percussion:Note:No mass. Palpation and Percussion of the abdomen reveal- Non Tender, Non Distended + BS, no rebound or guarding.  Anterior thorax- no pain on palpation of chest wall.    Neurologic Cranial Nerve exam:- CN III-XII intact(No nystagmus), symmetric smile. Strength:- 5/5 equal and symmetric strength both upper and lower extremities.      Assessment & Plan:  Pt ekg today shows negative precordial t waves v1 and v2. In 2011. That was not the case.  Your recent chest pain with shortness of breath and subsequent worsening fatigue is of some concern. Your ekg today does looks some different from prior ekg in 2011.  I would prefer evaluation in ED today since studies can be done quicker. I don't think good idea doing outpaitent troponin particularly on Friday. Also other labs to evaluate fatigue can be done quicker in ED than outpatient.  Follow up her post ED evaluation as they determine.   I did talk with ED physician today and let MD know I am sending you down.

## 2016-01-25 NOTE — Patient Instructions (Addendum)
Your recent chest pain with shortness of breath and subsequent worsening fatigue is of some concern. Your ekg today does looks some different from prior ekg in 2011.  I would prefer evaluation in ED today since studies can be done quicker. I don't think good idea doing outpaitent troponin particularly on Friday. Also other labs to evaluate fatigue can be done quicker in ED than outpatient.  Follow up her post ED evaluation as they determine.   I did talk with ED physician today and let MD know I am sending you down.

## 2016-01-25 NOTE — Discharge Instructions (Signed)
?i?n tm ?? c th? nghi?m g?ng s?c (Exercise Stress Electrocardiogram) ?i?n tm ?? c th? nghi?m g?ng s?c l m?t th? nghi?m ?nh gi ngu?n cung c?p mu cho tim c?a qu v?. Th? nghi?m ny c?ng cn ???c g?i l ?i?n tm ?? c th? nghi?m g?ng s?c. Th? ghi?m ny ???c th?c hi?n trong khi qu v? ?ang ?i b? trn m?t my ch?y b?. M?c tiu c?a th? nghi?m ny l ?? nng cao nh?p tim c?a qu v?. Th? nghi?m ny ???c th?c hi?n ?? tm cc khu v?c c dng mu ??n tim km b?ng cch xc ??nh m?c ?? c?a b?nh ??ng m?ch vnh (CAD).   CAD ???c xc ??nh l s? thu h?p h?n 70% ? m?t ho?c nhi?u ??ng m?ch tim (??ng m?ch vnh). N?u qu v? c k?t qu? th? nghi?m g?ng s?c b?t th??ng, ?i?u ny c th? c ngh?a l qu v? khng nh?n ?? l?u l??ng mu ??n tim trong khi t?p. C th? c?n ki?m tra thm ?? tm l [place] v sao k?t qu? th? nghi?m c?a qu v? l?i b?t th??ng. HY ?? CHUYN GIA CH?M [place] S?C KH?E BI?T V?:   B?t k? v?n ?? d? ?ng no m qu v? c.  T?t c? cc lo?i thu?c no m qu v? ?ang s? d?ng, bao g?m c? vitamin, th?o d??c, thu?c nh? m?t, thu?c khng c?n k ??n.  Nh?ng v?n ?? tr??c ?y m qu v? ho?c ng??i trong gia ?nh qu v? ? b? khi s? d?ng thu?c gy m.  B?t k? cc r?i lo?n v? mu no m qu v? c.  Cc ph?u thu?t tr??c ? qu v? ? c.  Cc tnh tr?ng b?nh l c?a qu v?.  Kh? n?ng mang thai, n?u ?i?u ny ???c p d?ng. NGUY C? V BI?N CH?NG Ni chung, ?y l m?t th? thu?t an ton. Tuy nhin, nh? v?i b?t k? th? thu?t no, bi?n ch?ng c th? x?y ra. Bi?n ch?ng c th? x?y ra bao g?m:  ?au ho?c t?c ? nh?ng khu v?c sau:  Ng?c.  Hm ho?c c?.  Gi?a hai x??ng b? vai.  Lan xu?ng cnh tay tri c?a qu v?.  Chng m?t ho?c chong vng.  Kh th?.  Nh?p tim t?ng ho?c khng ??u.  Bu?n nn ho?c nn m?a.  Nh?i mu c? tim (hi?m g?p). TR??C KHI TH?C HI?N TH? THU?T  Trnh t?t c? d?ng ?? u?ng c caffein 24 gi? tr??c khi lm th? nghi?m ho?c theo ch? d?n c?a chuyn gia ch?m [place] s?c kh?e. Nh?ng ?? c ch?a caffein bao g?m c  ph, tr (k? c? tr ? b? caffein), n??c soda c caffein, s-c-la, c-ca, v m?t s? lo?i thu?c gi?m ?au.  Lm theo cc ch? d?n c?a chuyn gia ch?m [place] s?c kh?e v? vi?c ?n v u?ng tr??c khi lm th? nghi?m.  Dng thu?c c?a qu v? theo ch? ??nh vo nh?ng th?i gian thng th??ng v?i n??c, tr? khi c ch? d?n khc. Nh?ng tr??ng h?p ngo?i l? c th? bao g?m:  N?u qu v? b? ti?u ???ng, hy h?i xem qu v? s? dng insulin ho?c thu?c nh? th? no Th??ng ph?i ?i?u ch?nh li?u insulin vo bu?i sng lm th? nghi?m.  N?u qu v? ?ang dng thu?c ch?n beta, ?i?u quan tr?ng l ph?i ni v?i chuyn gia ch?m [place] s?c kh?e v? nh?ng lo?i thu?c ny s?m tr??c ngy lm nghi?m php. Vi?c dng thu?c ch?n beta c th? ?nh h??ng ??n th? nghi?m. Trong m?t s? tr??ng  h?p, nh?ng thu?c ny c th? c?n thay ??i ho?c d?ng 24 gi? ho?c lu h?n tr??c khi lm th? nghi?m.  N?u qu v? ?ang dn m?t mi?ng dn nitroglycerin, c th? c?n b? mi?ng dn ? ra tr??c khi lm th? nghi?m. Hy h?i chuyn gia ch?m [place] s?c kh?e xem c c?n tho mi?ng dn ra tr??c khi lm th? nghi?m hay khng.  N?u qu v? s? d?ng thu?c d?ng ht ?? ?i?u tr? b?t k? tnh tr?ng b?nh l no v? h h?p, hy mang lo?i thu?c ? theo khi ??n lm th? nghi?m.  N?u qu v? l m?t b?nh nhn ngo?i tr, hy mang theo th?c ?n nh? ?? qu v? c th? ?n ngay sau giai ?o?n g?ng s?c c?a th? nghi?m.  Khng ht thu?c trong 4 gi? tr??c khi lm th? nghi?m ho?c theo ch? d?n c?a chuyn gia ch?m [place] s?c kh?e.  Khng bi thu?c d?ng dung d?ch, thu?c b?t, kem, ho?c d?u no ln ng?c tr??c khi lm th? nghi?m.  M?c qu?n o r?ng v ?i giy tho?i mi ?? lm th? nghi?m ny. Th? nghi?m ny lin quan ??n vi?c ?i b? trn my ch?y b?. THU? THU?T  Nhi?u mi?ng dn (?i?n c?c) s? ???c ??t ln ng?c qu v?. N?u c?n thi?t, cc khu v?c nh? trn ng?c qu v? c th? ???c c?o lng ?? ti?p xc t?t h?n v?i cc ?i?n c?c. Khi cc ?i?n c?c ???c g?n vo c? th? c?a qu v?, nhi?u dy d?n s? ???c n?i v?i ?i?n c?c v nh?p tim c?a qu v? s?  ???c theo di.  Tim c?a qu v? s? ???c theo di c? lc ngh? ng?i v lc luy?n t?p.  Qu v? s? ?i trn m?t my ch?y b?Marland Kitchen My ch?y b? s? ???c b?t ??u v?i m?t t?c ?? ch?m. T?c ?? v ?? nghing c?a my ch?y b? s? ???c t?ng d?n ?? t?ng nh?p tim c?a qu v?. SAU KHI TH?C HI?N TH? THU?T  Nh?p tim v huy?t p c?a qu v? s? ???c theo di sau khi lm th? nghi?m.  Qu v? c th? tr? l?i l?ch trnh bnh th??ng c?a mnh bao g?m ch? ?? ?n, ho?t ??ng, thu?c men, tr? khi chuyn gia ch?m [place] s?c kh?e h??ng d?n khc.   Thng tin ny khng nh?m m?c ?ch thay th? cho l?i khuyn m chuyn gia ch?m [place] s?c kh?e ni v?i qu v?. Hy b?o ??m qu v? ph?i th?o lu?n b?t k? v?n ?? g m qu v? c v?i chuyn gia ch?m [place] s?c kh?e c?a qu v?.   Document Released: 10/20/2005 Document Revised: 10/25/2013 Elsevier Interactive Patient Education [employer].

## 2016-01-25 NOTE — ED Provider Notes (Signed)
CSN: [id number]     Arrival date & time 01/25/16  1019 History   First MD Initiated Contact with Patient 01/25/16 1022     No chief complaint on file.    (Consider location/radiation/quality/duration/timing/severity/associated sxs/prior Treatment) HPI   75 year old [place] speaking female accompanied by family to the ED for evaluation of chest discomfort. Patient was sent here by her PCP for further evaluation of her chest pain. Patient report 2 weeks ago she developed abdominal pain without nausea vomiting or diarrhea. She was seen by her PCP and was started on amoxicillin, clarithromycin, Prilosec, and Zantac for 14 days. She has been taking the medication with moderate improvement. She denies having any active abdominal pain at this time. Yesterday she did develop mild anterior chest discomfort in which she described as a achy pressure sensation lasting for several hours improved with resting. Pain was intermittent throughout the day but has fully resolved. She did report mild dizziness and "not feeling myself" during this episode. She reported having generalized fatigue during that episode. She admits that she has had similar symptoms intermittently for the past several months sometimes brought on by exertion and improved with rest. She denies any cardiac history and no provocative testing in the past. She is a nonsmoker. She does not have a significant history of hypertension or diabetes. Patient currently denies having fever, headache, lightheadedness, dizziness, diaphoresis, nausea, vomiting, chest pain, shortness of breath, productive cough, abdominal pain, back pain, focal numbness or weakness, or rash.  Past Medical History  Diagnosis Date  . Arthritis    Past Surgical History  Procedure Laterality Date  . Tubal ligation    . Cesarean section     Family History  Problem Relation Age of Onset  . Asthma Brother    Social History  Substance Use Topics  . Smoking status: Never  Smoker   . Smokeless tobacco: Not on file  . Alcohol Use: No   OB History    No data available     Review of Systems  All other systems reviewed and are negative.     Allergies  Tramadol hcl  Home Medications   Prior to Admission medications   Medication Sig Start Date End Date Taking? Authorizing Provider  amoxicillin (AMOXIL) 500 MG capsule Take 1 capsule (500 mg total) by mouth 2 (two) times daily. X 14 days 01/16/16   Rosalita Chessman, Mcphie  clarithromycin (BIAXIN) 500 MG tablet Take 1 tablet (500 mg total) by mouth 2 (two) times daily. 01/16/16   Alferd Apa Lowne, Villacis  diphenhydramine-acetaminophen (TYLENOL PM) 25-500 MG TABS tablet Take 1 tablet by mouth at bedtime as needed.    Historical Provider, MD  esomeprazole (NEXIUM) 40 MG capsule Take 1 capsule (40 mg total) by mouth daily. x14 days 01/16/16   Rosalita Chessman, [place]  lidocaine (LIDODERM) 5 % Place 1 patch onto the skin daily. Remove & Discard patch within 12 hours or as directed by MD 03/02/13   Rosalita Chessman, [place]  mometasone (NASONEX) 50 MCG/ACT nasal spray Place 2 sprays into the nose daily. 03/06/14   Rosalita Chessman, [place]  ranitidine (ZANTAC) 150 MG tablet Take 1 tablet (150 mg total) by mouth 2 (two) times daily. 01/14/16   Rosalita Chessman, Roland   There were no vitals taken for this visit. Physical Exam  Constitutional: She is oriented to person, place, and time. She appears well-developed and well-nourished. No distress.  Elderly Asian female appears to be in no acute discomfort.  HENT:  Head: Atraumatic.  Eyes: Conjunctivae are normal.  Neck: Normal range of motion. Neck supple. No JVD present. No tracheal deviation present. No thyromegaly present.  Cardiovascular: Normal rate, regular rhythm and intact distal pulses.   Pulmonary/Chest: Effort normal and breath sounds normal. She has no wheezes. She has no rales. She exhibits no tenderness.  Abdominal: Soft. Bowel sounds are normal. She exhibits no distension. There is no  tenderness. There is no rebound and no guarding.  Musculoskeletal: She exhibits no edema.  Neurological: She is alert and oriented to person, place, and time.  Skin: No rash noted.  Psychiatric: She has a normal mood and affect.  Nursing note and vitals reviewed.   ED Course  Procedures (including critical care time) Labs Review Labs Reviewed  CBC WITH DIFFERENTIAL/PLATELET  BASIC METABOLIC PANEL  TROPONIN I    Imaging Review Dg Chest 2 View  01/25/2016  CLINICAL DATA:  Chest burning and shortness of breath today. Initial encounter. EXAM: CHEST  2 VIEW COMPARISON:  PA and lateral chest 08/09/2007. FINDINGS: The lungs are clear. Heart size is normal. No pneumothorax or pleural effusion. No focal bony abnormality. IMPRESSION: No acute disease. Electronically Signed   By: Inge Rise M.D.   On: 01/25/2016 11:22   I have personally reviewed and evaluated these images and lab results as part of my medical decision-making.   EKG Interpretation   Date/Time:  Friday January 25 2016 10:49:40 EDT Ventricular Rate:  85 PR Interval:  160 QRS Duration: 85 QT Interval:  372 QTC Calculation: 442 R Axis:   50 Text Interpretation:  Sinus rhythm Normal ECG Confirmed by DELO  MD,  DOUGLAS ([id number]) on 01/25/2016 11:02:06 AM      MDM   Final diagnoses:  Chest pain, unspecified chest pain type    BP 155/90 mmHg  Pulse 81  Temp(Src) 98.5 F (36.9 C) (Oral)  Resp 17  Ht 5\' 2"  (1.575 m)  Wt 56.7 kg  BMI 22.86 kg/m2  SpO2 100%   10:46 AM Pt sent here from PCPs office due to having vague chest pain yesterday. She is chest pain-free. Her initial office EKG is without any acute ischemic changes. Will repeat an EKG, workup initiated. Given her age, anticipate a cardiac rule out is necessary but this can be done outpt. Care discussed with Dr. Stark Jock.    1:15 PM Patient remains asymptomatic. Workup has been unremarkable. She is breast decided to go home and she can follow-up with her PCP  for outpatient cardiac workup which may include cardiac stress test. Return precaution discussed.  Domenic Moras, PA-C 01/25/16 [place], MD 01/25/16 1504

## 2016-02-13 ENCOUNTER — Ambulatory Visit: Payer: Medicare Other | Admitting: Medical

## 2016-02-17 IMAGING — US US TRANSVAGINAL NON-OB
1 series · 14 of 25 positions shown · non-contrast
Comparison: None

CLINICAL DATA: Abdominal pain

EXAM:
TRANSABDOMINAL AND TRANSVAGINAL ULTRASOUND OF PELVIS
TECHNIQUE: Both transabdominal and transvaginal ultrasound examinations of the
pelvis were performed. Transabdominal technique was performed for
global imaging of the pelvis including uterus, ovaries, adnexal
regions, and pelvic cul-de-sac. It was necessary to proceed with
endovaginal exam following the transabdominal exam to visualize the
uterus, endometrium and ovaries.

[Series 1: us transvaginal non-ob · 0.30mm/px · 14 of 46 slices shown]
[im 1/46]
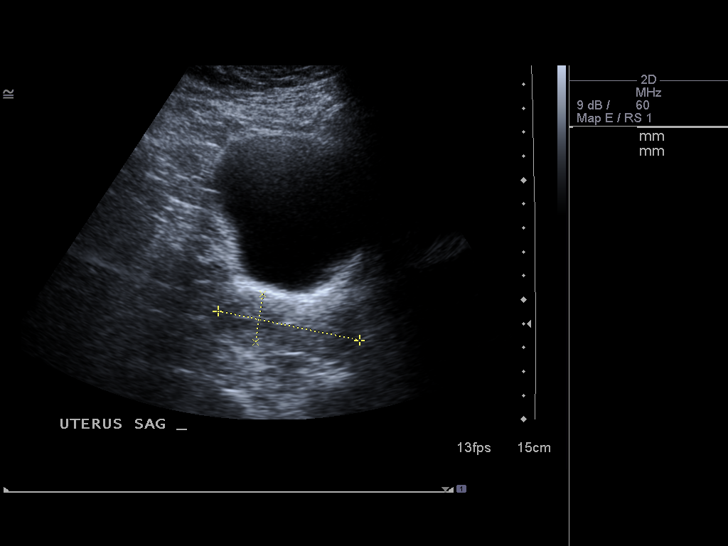
[im 4/46]
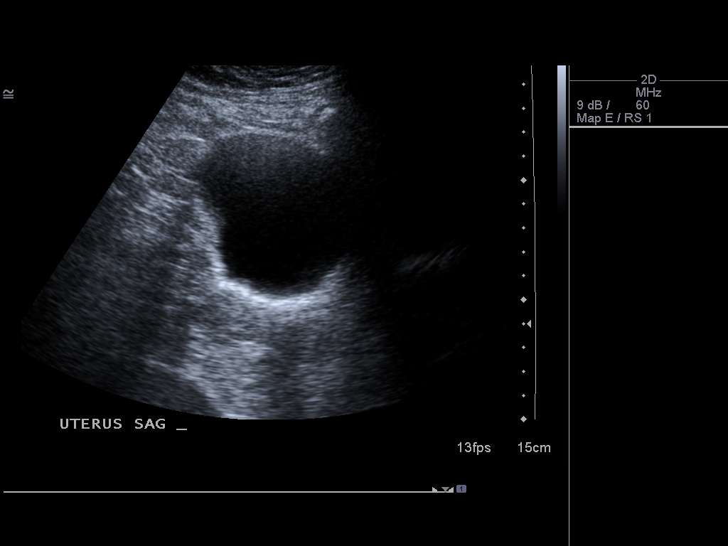
[im 8/46]
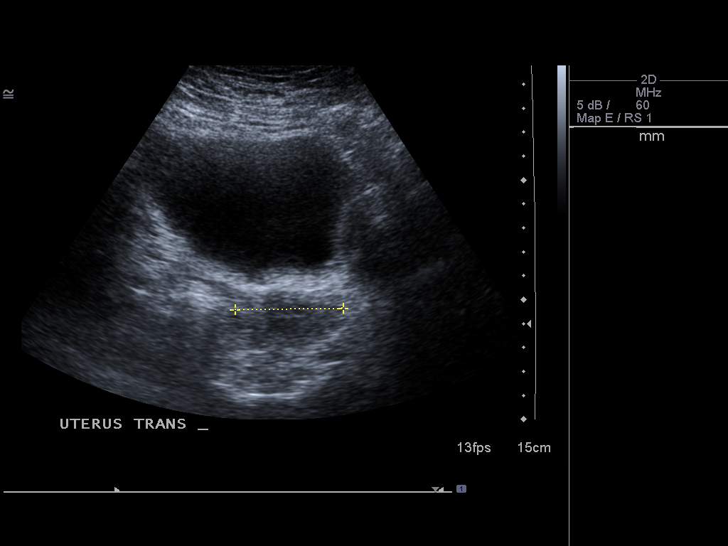
[im 12/46]
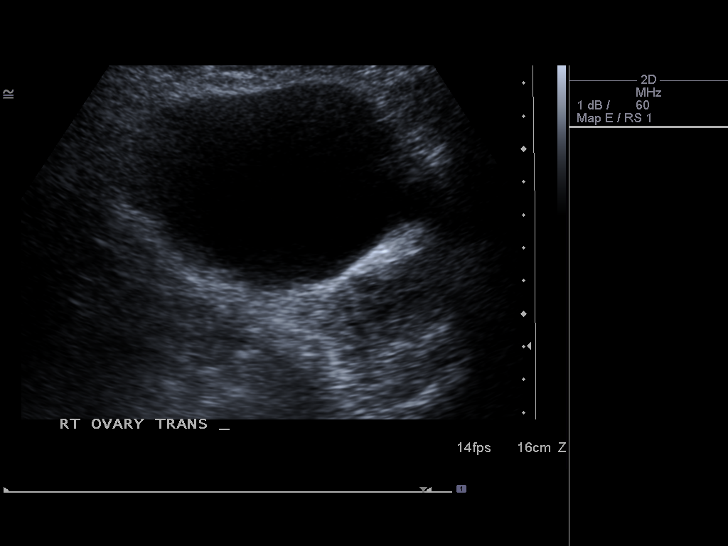
[im 16/46]
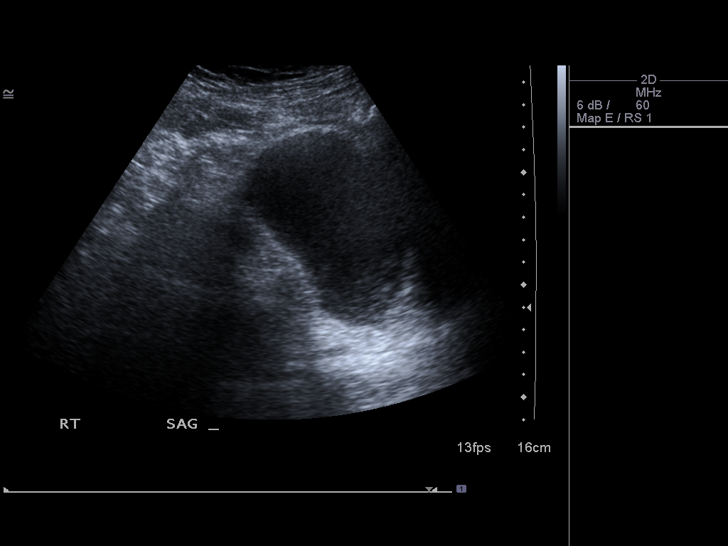
[im 17/46]
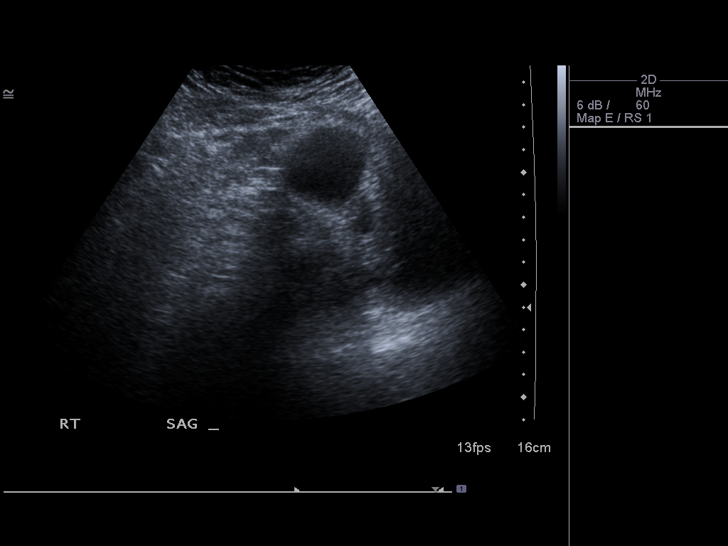
[im 21/46]
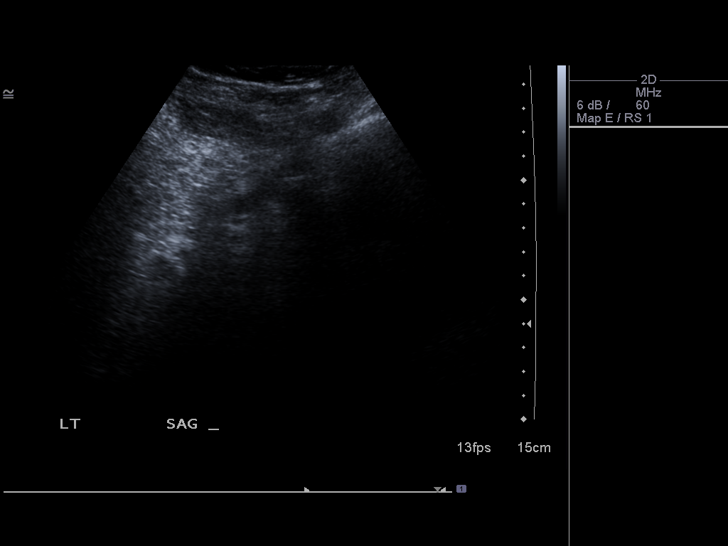
[im 25/46]
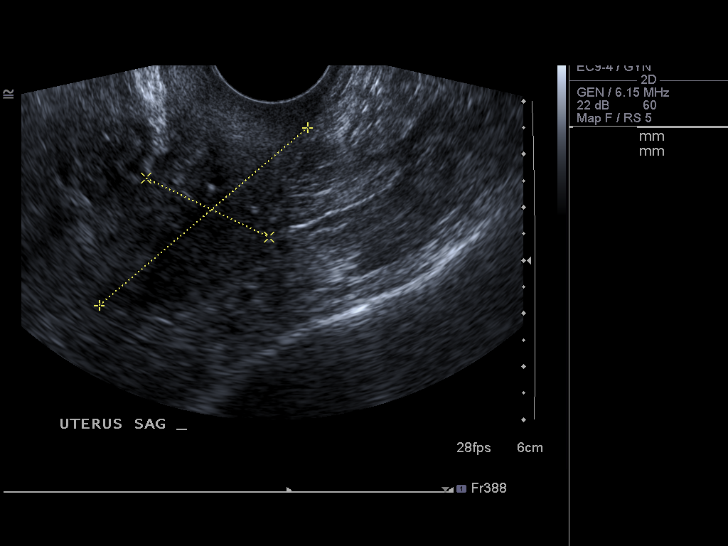
[im 29/46]
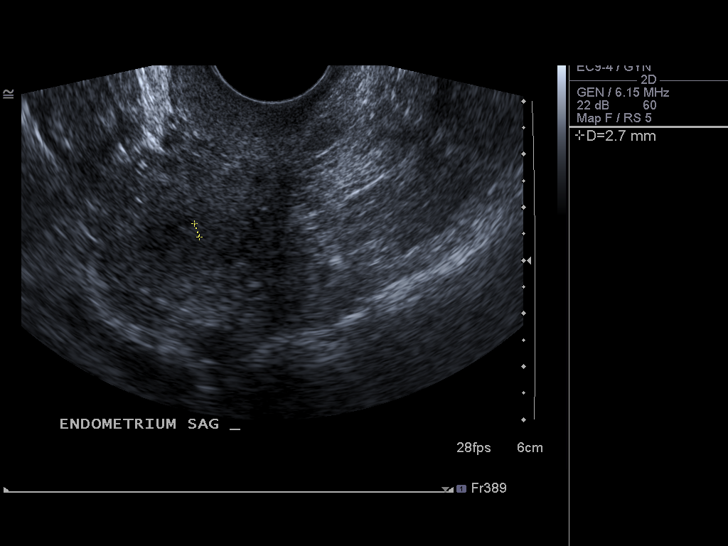
[im 31/46]
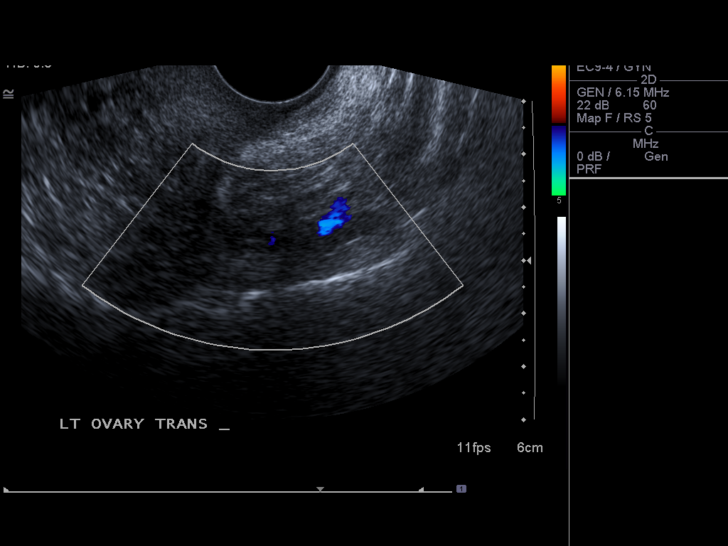
[im 34/46]
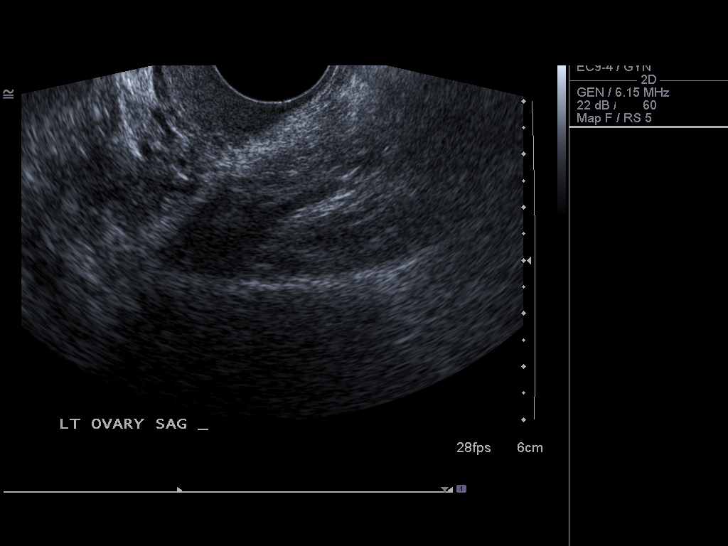
[im 38/46]
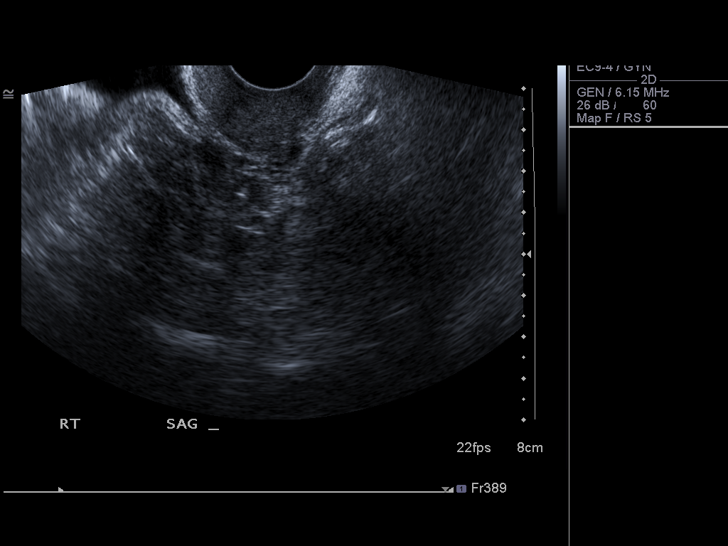
[im 42/46]
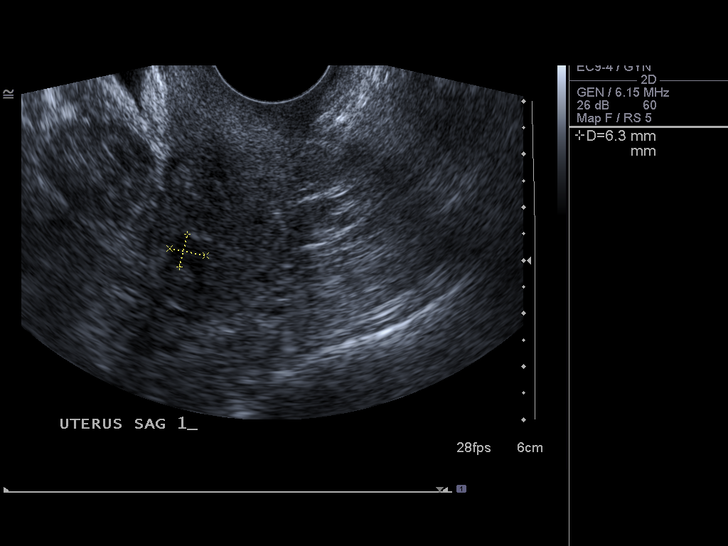
[im 46/46]
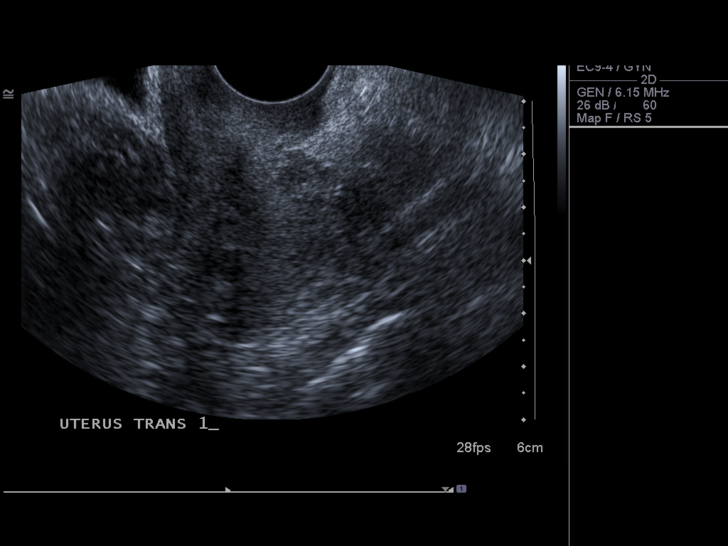

[14 of 25 positions shown; findings below may reference images not displayed]

FINDINGS: Uterus

Measurements: 6 x 2 x 4.5 cm.. Small anterior fibroid measures 6 x 8
x 7 mm.

Endometrium

Thickness: 2 mm.  No focal abnormality visualized.

Right ovary

Measurements: 1.9 x 1.3 x 2.0 cm. Normal appearance/no adnexal mass.

Left ovary

Measurements: 1.8 x 1.3 x 1.4 cm. Normal appearance/no adnexal mass.

Other findings

No free fluid.
IMPRESSION: 1. No acute findings.  No explanation for patient's pain
2. Sub cm anterior uterine fibroid.

## 2016-02-17 IMAGING — US US ABDOMEN COMPLETE
1 series · 14 of 25 positions shown · non-contrast
Comparison: 11/05/2004

CLINICAL DATA: Intermittent generalized abdominal pain and cramping
for 3 months.

EXAM:
ULTRASOUND ABDOMEN COMPLETE

[Series 1: us abdomen complete · 0.32mm/px · 14 of 71 slices shown]
[im 1/71]
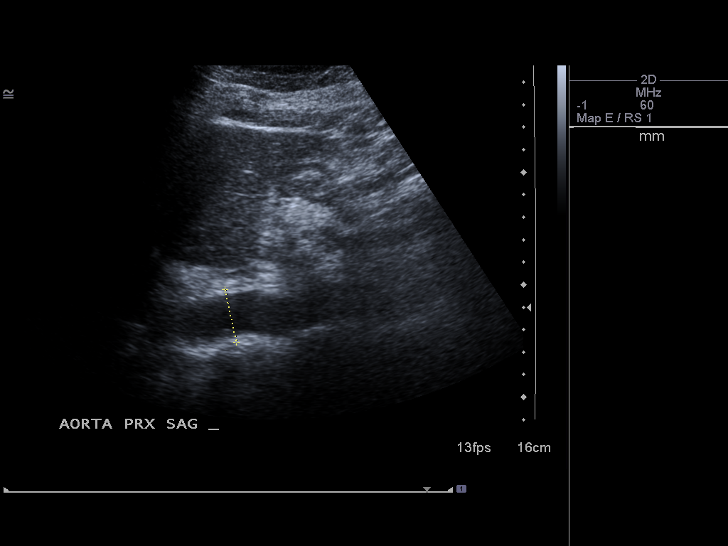
[im 6/71]
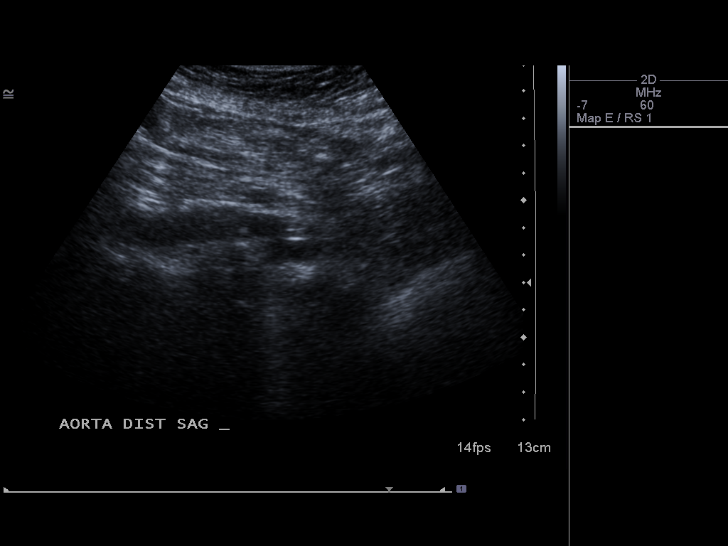
[im 12/71]
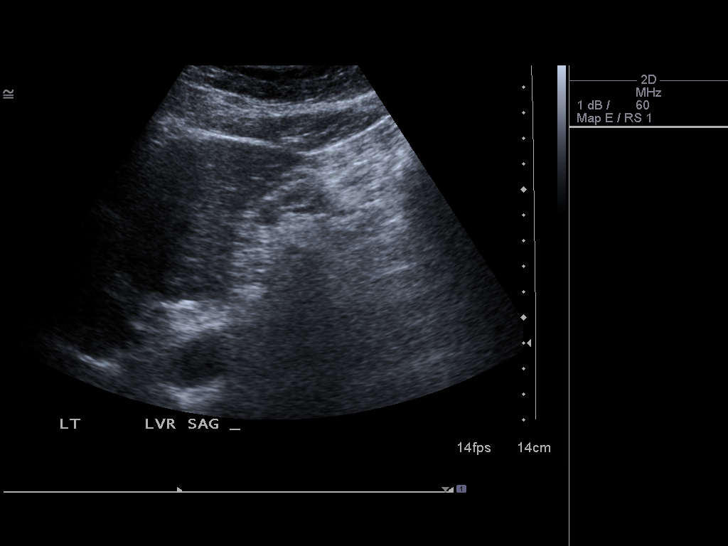
[im 18/71]
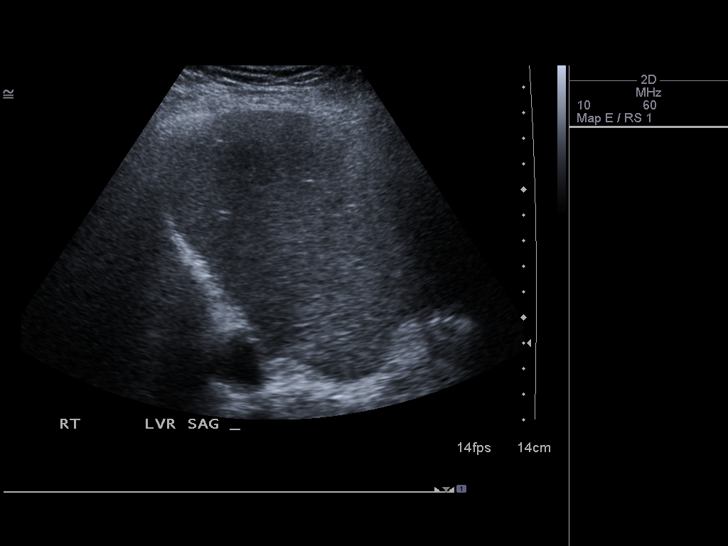
[im 24/71]
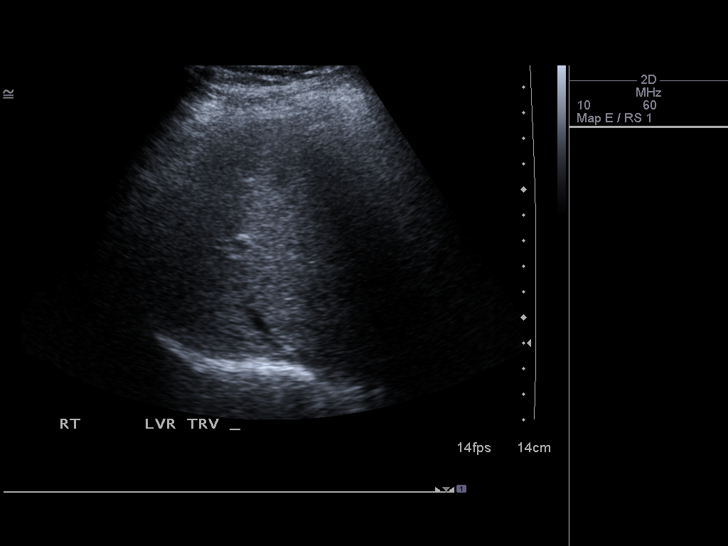
[im 27/71]
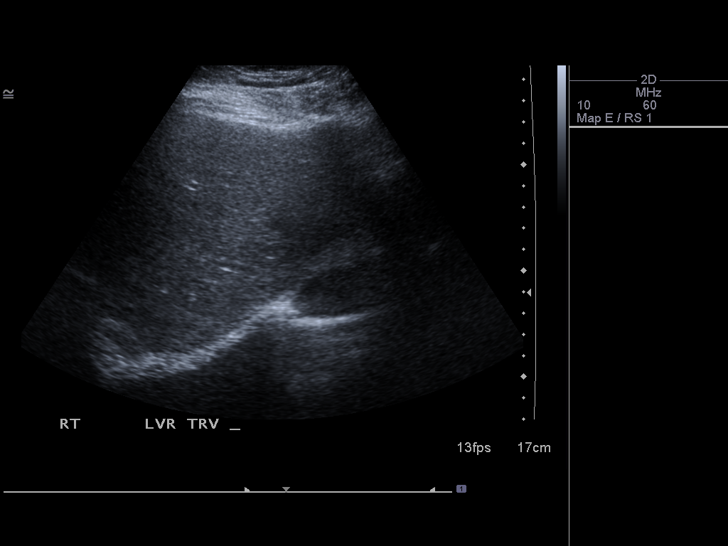
[im 33/71]
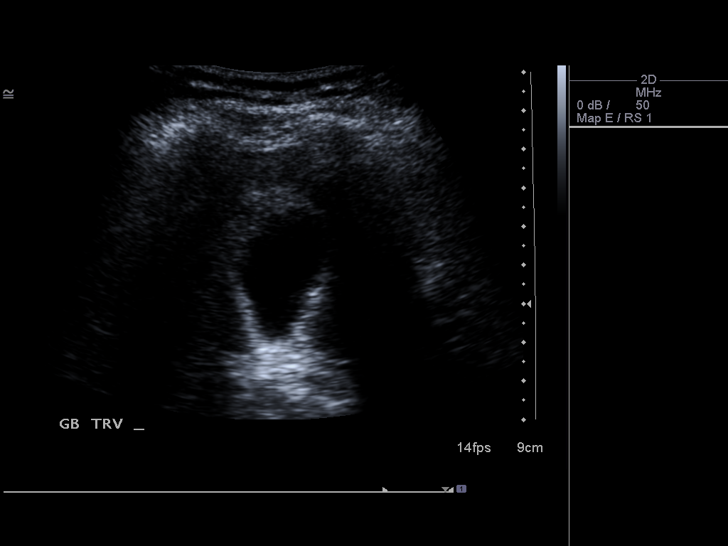
[im 38/71]
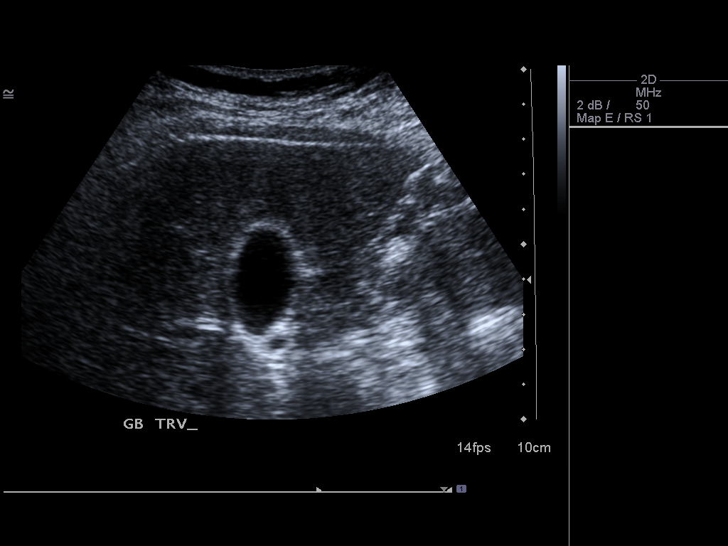
[im 44/71]
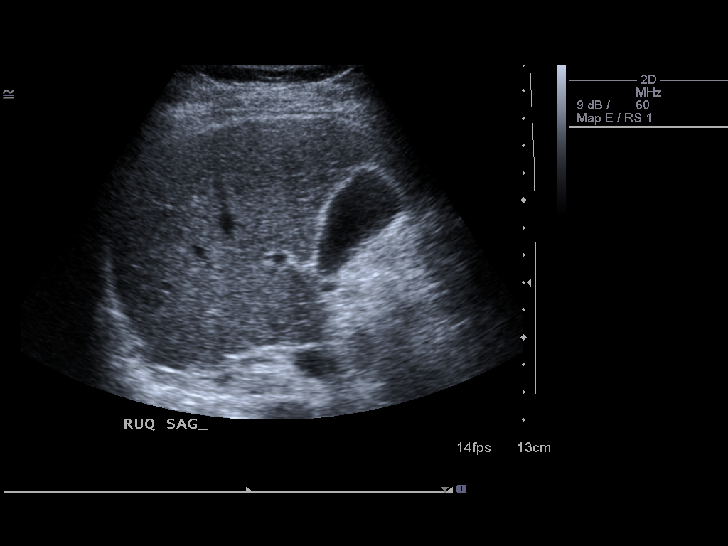
[im 47/71]
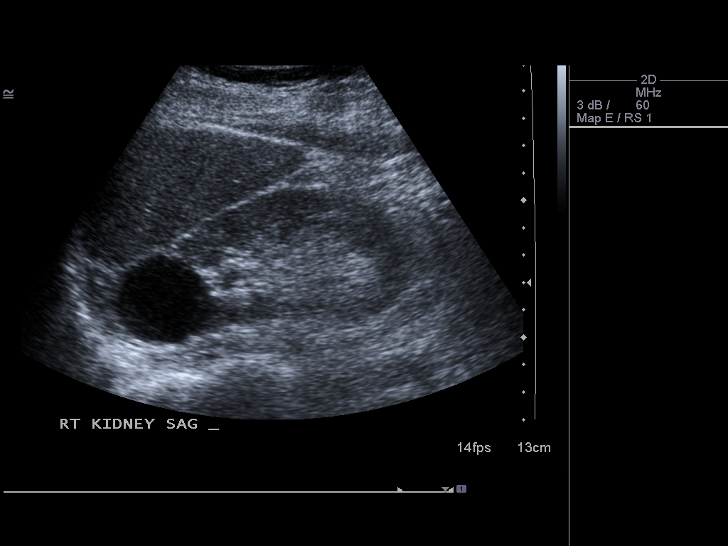
[im 53/71]
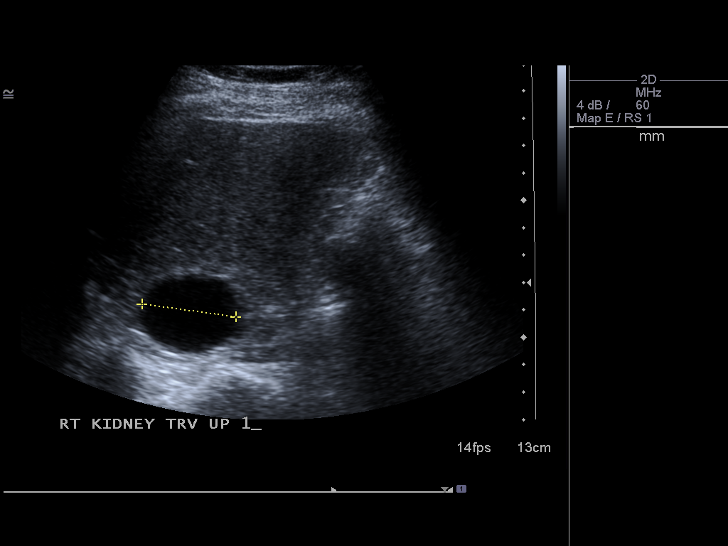
[im 59/71]
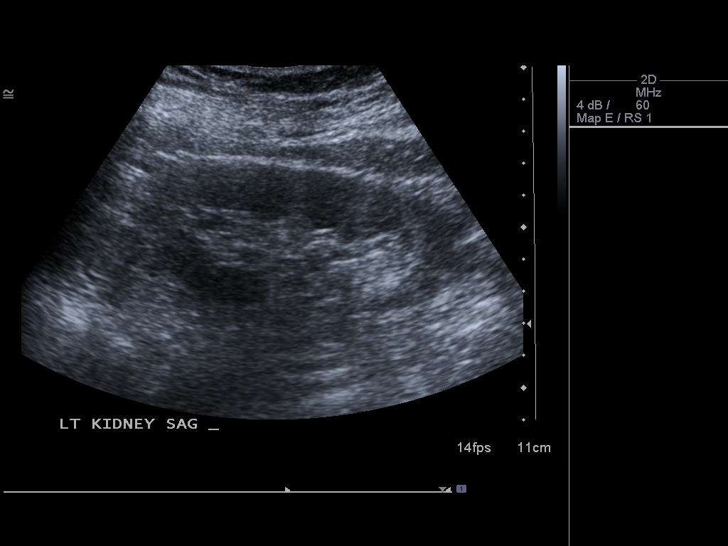
[im 65/71]
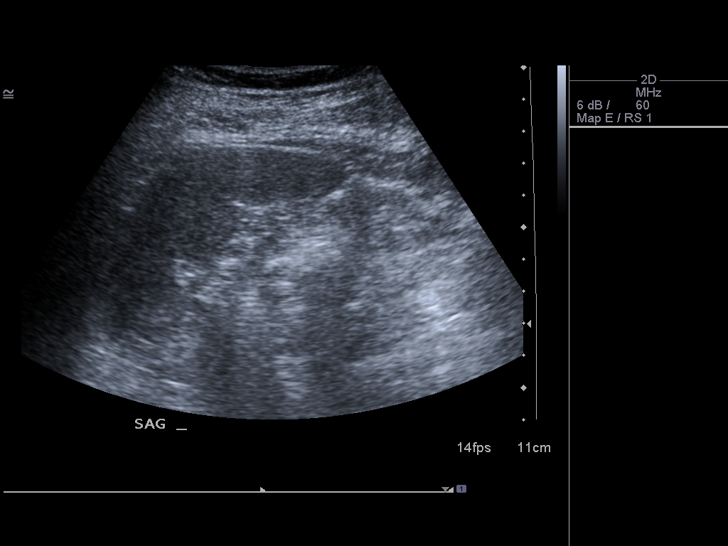
[im 71/71]
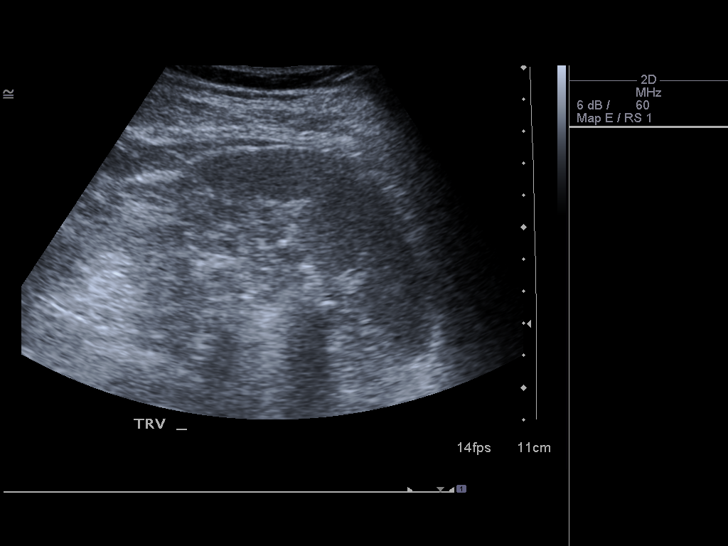

[14 of 25 positions shown; findings below may reference images not displayed]

FINDINGS: Gallbladder:

No gallstones or wall thickening visualized. No sonographic Murphy
sign noted.

Common bile duct:

Diameter: 2 mm

Liver:

No focal lesion identified. Within normal limits in parenchymal
echogenicity.

IVC:

No abnormality visualized.

Pancreas:

Visualized portion unremarkable.

Spleen:

Size and appearance within normal limits.

Right Kidney:

Length: 10.3 cm. 3.5 cm cyst arises from the upper pole. No other
right renal masses. Normal echogenicity. No stone or hydronephrosis.

Left Kidney:

Length: 9.9 cm. Echogenicity within normal limits. No mass or
hydronephrosis visualized.

Abdominal aorta:

No aneurysm visualized.

Other findings:

None.
IMPRESSION: 1. No acute findings. No findings to explain intermittent abdominal
pain.
2. Right renal cyst.

## 2016-02-18 ENCOUNTER — Ambulatory Visit (INDEPENDENT_AMBULATORY_CARE_PROVIDER_SITE_OTHER): Payer: Medicare Other | Admitting: Medical

## 2016-02-18 ENCOUNTER — Encounter: Payer: Self-pay | Admitting: Medical

## 2016-02-18 VITALS — BP 130/92 | HR 98 | Temp 98.1°F | Ht 59.5 in | Wt 126.4 lb

## 2016-02-18 DIAGNOSIS — R0789 Other chest pain: Secondary | ICD-10-CM

## 2016-02-18 DIAGNOSIS — Z8619 Personal history of other infectious and parasitic diseases: Secondary | ICD-10-CM | POA: Diagnosis not present

## 2016-02-18 DIAGNOSIS — K219 Gastro-esophageal reflux disease without esophagitis: Secondary | ICD-10-CM | POA: Diagnosis not present

## 2016-02-18 DIAGNOSIS — R1013 Epigastric pain: Secondary | ICD-10-CM

## 2016-02-18 DIAGNOSIS — R5383 Other fatigue: Secondary | ICD-10-CM | POA: Diagnosis not present

## 2016-02-18 DIAGNOSIS — R42 Dizziness and giddiness: Secondary | ICD-10-CM

## 2016-02-18 LAB — TSH: TSH: 2.36 u[IU]/mL (ref 0.35–4.50)

## 2016-02-18 MED ORDER — RANITIDINE HCL 150 MG PO TABS: 150.0000 mg | ORAL_TABLET | Freq: Two times a day (BID) | ORAL | Status: DC

## 2016-02-18 NOTE — Progress Notes (Signed)
Pre visit review using our clinic review tool, if applicable. No additional management support is needed unless otherwise documented below in the visit note. 

## 2016-02-18 NOTE — Progress Notes (Signed)
Subjective:    Patient ID: DEANE GETTINGER, female    DOB: 1941/02/09, 75 y.o.   MRN: [id number]  HPI  Pt in with some abdomen pain.(mild now was worse before she was placed on h pylori treatment)  Pt thinks she had h pylori. She has upset stomach in past. She states her abdomen still hurt little. She questions if got rid of bacteria.   She was seen by her PCP and was started on amoxicillin, clarithromycin, Prilosec, and Zantac for 14 days. She has been taking the medication with moderate improvement. I don't see h pylori testing.  Pt feels nexium is too strong.  Pt had some atypical chest pain in the past. I sent her to ED. ED work up was negative. No chest pain since.  Pt also has some transient dizziness lying to sitting up comes and goes and will only last second or so. Present on and off for 4-5 weeks.   Review of Systems  Constitutional: Negative for fever, chills, diaphoresis and fatigue.  HENT: Negative for congestion and drooling.   Respiratory: Negative for cough, chest tightness, shortness of breath and wheezing.   Cardiovascular: Negative for chest pain and palpitations.  Gastrointestinal: Positive for abdominal pain. Negative for nausea, diarrhea, constipation, abdominal distention and anal bleeding.       Not now comes and goes. But less so than in the past.  Musculoskeletal: Negative for myalgias, back pain and joint swelling.  Hematological: Negative for adenopathy.  Psychiatric/Behavioral: Negative for behavioral problems and confusion.    Past Medical History  Diagnosis Date  . Arthritis      Social History   Social History  . Marital Status: Married    Spouse Name: N/A  . Number of Children: N/A  . Years of Education: N/A   Occupational History  . Not on file.   Social History Main Topics  . Smoking status: Never Smoker   . Smokeless tobacco: Not on file  . Alcohol Use: No  . Drug Use: No  . Sexual Activity: Not on file   Other Topics Concern  . Not  on file   Social History Narrative   From [name] been 39 years lives with husband    Past Surgical History  Procedure Laterality Date  . Tubal ligation    . Cesarean section      Family History  Problem Relation Age of Onset  . Asthma Brother     Allergies  Allergen Reactions  . Tramadol Hcl     REACTION: blurred vision    Current Outpatient Prescriptions on File Prior to Visit  Medication Sig Dispense Refill  . diphenhydramine-acetaminophen (TYLENOL PM) 25-500 MG TABS tablet Take 1 tablet by mouth at bedtime as needed.    . ranitidine (ZANTAC) 150 MG tablet Take 1 tablet (150 mg total) by mouth 2 (two) times daily. 60 tablet 5   No current facility-administered medications on file prior to visit.    BP 130/92 mmHg  Pulse 98  Temp(Src) 98.1 F (36.7 C) (Oral)  Ht 4' 11.5" (1.511 m)  Wt 126 lb 6.4 oz (57.335 kg)  BMI 25.11 kg/m2  SpO2 98%       Objective:   Physical Exam  General Appearance- Not in acute distress.    General Mental Status- Alert. General Appearance- Not in acute distress.   Skin General: Color- Normal Color. Moisture- Normal Moisture.  Neck Carotid Arteries- Normal color. Moisture- Normal Moisture. No carotid bruits. No JVD.  Chest  and Lung Exam Auscultation: Breath Sounds:-Normal.  Cardiovascular Auscultation:Rythm- Regular. Murmurs & Other Heart Sounds:Auscultation of the heart reveals- No Murmurs.  Abdomen Inspection:-Inspeection Normal. Palpation/Percussion:Note:No mass. Palpation and Percussion of the abdomen reveal- Non Tender, Non Distended + BS, no rebound or guarding.    Neurologic Cranial Nerve exam:- CN III-XII intact(No nystagmus), symmetric smile. Strength:- 5/5 equal and symmetric strength both upper and lower extremities..      Assessment & Plan:  I have referred you to GI. I will see if they can move her appointment up.  If your chest pain returns then ED evaluation. But also would recommend evaluation  of stress test with cardiologist if chest pain returns.  Your transient dizziness can be common on change of position. If you have dizziness that occurs at other times other than position change let us know. Or if you have other symptoms(such as HA, blurred vision, arm weakness etc)  then ED evaluation.   For fatigue you mention will get tsh. Note cbc and cmp done in ED was negative.   Follow up in 3-4 weeks with Dr. Etter Sjogren.

## 2016-02-18 NOTE — Patient Instructions (Addendum)
I have referred you to GI. I will see if they can move her appointment up.  Continue zantac.  If your chest pain returns then ED evaluation. But also would recommend evaluation of stress test with cardiologist if chest pain returns.  Your transient dizziness can be common on change of position. If you have dizziness that occurs at other times other than position change let us know. Or if you have other symptoms with dizziness (such as HA, blurred vision, arm weakness etc)  then ED evaluation.   For fatigue you mention will get tsh. Note cbc and cmp done in ED was negative.   Follow up in 3-4 weeks with Dr. Etter Sjogren.

## 2016-02-19 ENCOUNTER — Telehealth: Payer: Self-pay | Admitting: Family Medicine

## 2016-02-19 LAB — H. PYLORI BREATH TEST: H. pylori Breath Test: NOT DETECTED

## 2016-02-19 NOTE — Telephone Encounter (Signed)
Spoke with the patients son who is on the HIPAA form and he will call back if there is any changes or need for another medication.

## 2016-02-19 NOTE — Telephone Encounter (Signed)
Please advise on medication change

## 2016-02-19 NOTE — Telephone Encounter (Signed)
Pt had a translator and I was told by him that pt thought nexium or PPI was too strong. This is why I rx'd ranitidine. Daughter in law is calling. Not even sure she is on hippa list. And pt does not understand english well. So you could pass this to [employer].

## 2016-02-19 NOTE — Telephone Encounter (Signed)
Caller name: Liam Rogers  Relation to [id number] in law  Call back number:[phone] Pharmacy: [id number] - [place], [place] [phone] (Phone) [phone] (Fax)         Reason for call:  Patient informed PA doesn't like ranitidine (ZANTAC) 150 MG tablet and would like an alternate. Patient requesting another Rx. Please advise

## 2016-03-11 ENCOUNTER — Ambulatory Visit (INDEPENDENT_AMBULATORY_CARE_PROVIDER_SITE_OTHER): Payer: Medicare Other | Admitting: Family Medicine

## 2016-03-11 ENCOUNTER — Encounter: Payer: Self-pay | Admitting: Family Medicine

## 2016-03-11 VITALS — BP 130/82 | HR 83 | Temp 98.5°F | Ht 60.0 in | Wt 127.4 lb

## 2016-03-11 DIAGNOSIS — K219 Gastro-esophageal reflux disease without esophagitis: Secondary | ICD-10-CM | POA: Diagnosis not present

## 2016-03-11 MED ORDER — PANTOPRAZOLE SODIUM 40 MG PO TBEC: 40.0000 mg | DELAYED_RELEASE_TABLET | Freq: Every day | ORAL | Status: DC

## 2016-03-11 NOTE — Progress Notes (Signed)
Pre visit review using our clinic review tool, if applicable. No additional management support is needed unless otherwise documented below in the visit note. 

## 2016-03-11 NOTE — Patient Instructions (Signed)
Food Choices for Gastroesophageal Reflux Disease, Adult When you have gastroesophageal reflux disease (GERD), the foods you eat and your eating habits are very important. Choosing the right foods can help ease the discomfort of GERD. WHAT GENERAL GUIDELINES Prim[place] I NEED TO FOLLOW?  Choose fruits, vegetables, whole grains, low-fat dairy products, and low-fat meat, fish, and poultry.  Limit fats such as oils, salad dressings, butter, nuts, and avocado.  Keep a food diary to identify foods that cause symptoms.  Avoid foods that cause reflux. These may be different for different people.  Eat frequent small meals instead of three large meals each day.  Eat your meals slowly, in a relaxed setting.  Limit fried foods.  Cook foods using methods other than frying.  Avoid drinking alcohol.  Avoid drinking large amounts of liquids with your meals.  Avoid bending over or lying down until 2-3 hours after eating. WHAT FOODS ARE NOT RECOMMENDED? The following are some foods and drinks that may worsen your symptoms: Vegetables Tomatoes. Tomato juice. Tomato and spaghetti sauce. Chili peppers. Onion and garlic. Horseradish. Fruits Oranges, grapefruit, and lemon (fruit and juice). Meats High-fat meats, fish, and poultry. This includes hot dogs, ribs, ham, sausage, salami, and bacon. Dairy Whole milk and chocolate milk. Sour cream. Cream. Butter. Ice cream. Cream cheese.  Beverages Coffee and tea, with or without caffeine. Carbonated beverages or energy drinks. Condiments Hot sauce. Barbecue sauce.  Sweets/Desserts Chocolate and cocoa. Donuts. Peppermint and spearmint. Fats and Oils High-fat foods, including French fries and potato chips. Other Vinegar. Strong spices, such as black pepper, white pepper, red pepper, cayenne, curry powder, cloves, ginger, and chili powder. The items listed above may not be a complete list of foods and beverages to avoid. Contact your dietitian for more  information.   This information is not intended to replace advice given to you by your health care provider. Make sure you discuss any questions you have with your health care provider.   Document Released: 10/20/2005 Document Revised: 11/10/2014 Document Reviewed: 08/24/2013 Elsevier Interactive Patient Education 2016 Elsevier Inc.  

## 2016-03-11 NOTE — Progress Notes (Signed)
Patient ID: Ariel Williams, female    DOB: 05/06/1941  Age: 75 y.o. MRN: [id number]    Subjective:  Subjective HPI Ariel Williams presents for f/u gerd.  She feels the zantac causing heartburn--- she is eating less because her stomach rumbles.  No NVD,  No abd pain.    Review of Systems  Constitutional: Negative for diaphoresis, appetite change, fatigue and unexpected weight change.  Eyes: Negative for pain, redness and visual disturbance.  Respiratory: Negative for cough, chest tightness, shortness of breath and wheezing.   Cardiovascular: Negative for chest pain, palpitations and leg swelling.  Gastrointestinal: Positive for abdominal distention.       Midepigastroc tenderness  Endocrine: Negative for cold intolerance, heat intolerance, polydipsia, polyphagia and polyuria.  Genitourinary: Negative for dysuria, frequency and difficulty urinating.  Neurological: Negative for dizziness, light-headedness, numbness and headaches.    History Past Medical History  Diagnosis Date  . Arthritis     She has past surgical history that includes Tubal ligation and Cesarean section.   Her family history includes Asthma in her brother.She reports that she has never smoked. She does not have any smokeless tobacco history on file. She reports that she does not drink alcohol or use illicit drugs.  Current Outpatient Prescriptions on File Prior to Visit  Medication Sig Dispense Refill  . diphenhydramine-acetaminophen (TYLENOL PM) 25-500 MG TABS tablet Take 1 tablet by mouth at bedtime as needed.     No current facility-administered medications on file prior to visit.     Objective:  Objective Physical Exam  Constitutional: She is oriented to person, place, and time. She appears well-developed and well-nourished.  HENT:  Head: Normocephalic and atraumatic.  Eyes: Conjunctivae and EOM are normal.  Neck: Normal range of motion. Neck supple. No JVD present. Carotid bruit is not present. No thyromegaly  present.  Cardiovascular: Normal rate, regular rhythm and normal heart sounds.   No murmur heard. Pulmonary/Chest: Effort normal and breath sounds normal. No respiratory distress. She has no wheezes. She has no rales. She exhibits no tenderness.  Abdominal: She exhibits no mass. There is tenderness. There is no rebound and no guarding.    Musculoskeletal: She exhibits no edema.  Neurological: She is alert and oriented to person, place, and time.  Psychiatric: She has a normal mood and affect.  Nursing note and vitals reviewed.  BP 130/82 mmHg  Pulse 83  Temp(Src) 98.5 F (36.9 C) (Oral)  Ht 5' (1.524 m)  Wt 127 lb 6.4 oz (57.788 kg)  BMI 24.88 kg/m2  SpO2 98% Wt Readings from Last 3 Encounters:  03/11/16 127 lb 6.4 oz (57.788 kg)  02/18/16 126 lb 6.4 oz (57.335 kg)  01/25/16 125 lb (56.7 kg)     Lab Results  Component Value Date   WBC 6.6 01/25/2016   HGB 14.3 01/25/2016   HCT 42.3 01/25/2016   PLT 239 01/25/2016   GLUCOSE 88 01/25/2016   CHOL 199 09/11/2015   TRIG 196.0* 09/11/2015   HDL 42.90 09/11/2015   LDLDIRECT 140.0 03/02/2013   LDLCALC 117* 09/11/2015   ALT 12 01/14/2016   AST 19 01/14/2016   NA 140 01/25/2016   K 3.9 01/25/2016   CL 107 01/25/2016   CREATININE 0.77 01/25/2016   BUN 13 01/25/2016   CO2 23 01/25/2016   TSH 2.36 02/18/2016    Dg Chest 2 View  01/25/2016  CLINICAL DATA:  Chest burning and shortness of breath today. Initial encounter. EXAM: CHEST  2 VIEW COMPARISON:  PA and lateral chest 08/09/2007. FINDINGS: The lungs are clear. Heart size is normal. No pneumothorax or pleural effusion. No focal bony abnormality. IMPRESSION: No acute disease. Electronically Signed   By: Inge Rise M.D.   On: 01/25/2016 11:22     Assessment & Plan:  Plan I have discontinued Ms. Naab's ranitidine. I am also having her start on pantoprazole. Additionally, I am having her maintain her diphenhydramine-acetaminophen.  Meds ordered this encounter    Medications  . pantoprazole (PROTONIX) 40 MG tablet    Sig: Take 1 tablet (40 mg total) by mouth daily.    Dispense:  30 tablet    Refill:  3    Problem List Items Addressed This Visit      Unprioritized   GERD - Primary   Relevant Medications   pantoprazole (PROTONIX) 40 MG tablet    gi referral pending See labs done previously  Follow-up: Return if symptoms worsen or fail to improve.  [name], [place]

## 2016-03-14 ENCOUNTER — Ambulatory Visit (INDEPENDENT_AMBULATORY_CARE_PROVIDER_SITE_OTHER): Payer: Medicare Other | Admitting: Gastroenterology

## 2016-03-14 ENCOUNTER — Encounter: Payer: Self-pay | Admitting: Gastroenterology

## 2016-03-14 VITALS — BP 150/80 | HR 80 | Ht 59.84 in | Wt 127.2 lb

## 2016-03-14 DIAGNOSIS — B9681 Helicobacter pylori [H. pylori] as the cause of diseases classified elsewhere: Secondary | ICD-10-CM | POA: Diagnosis not present

## 2016-03-14 DIAGNOSIS — A048 Other specified bacterial intestinal infections: Secondary | ICD-10-CM

## 2016-03-14 NOTE — Progress Notes (Signed)
[facility] Consult Note:  HistorySUNNYE Williams 03/14/2016  Referring physician: [name], [place]  Reason for consult/chief complaint: Abdominal Pain   Subjective HPI:  This 75 year old [place] Williams woman was referred for recent diagnosis of H. pylori and epigastric pain. She was seen with the [place] interpreter. She describes having had a few months of a burning epigastric discomfort that was nonradiating with no clear triggers or relieving factors. She was diagnosed with H. pylori by serology, and received 2 weeks of amoxicillin and clarithromycin and pantoprazole. She finished it in early April, and 2 weeks after that a repeat urea breath test which was negative while she was off antisecretory therapy. She has complete resolution of the symptoms. At this point, she denies abdominal pain nausea vomiting early satiety dysphagia or weight loss.   ROS:  Review of Systems  She denies chest pain dyspnea or dysuria Past Medical History: Past Medical History  Diagnosis Date  . Arthritis   . H. pylori infection      Past Surgical History: Past Surgical History  Procedure Laterality Date  . Tubal ligation       Family History: Family History  Problem Relation Age of Onset  . Asthma Brother   . Stroke Brother     Social History: Social History   Social History  . Marital Status: Married    Spouse Name: N/A  . Number of Children: 77  . Years of Education: N/A   Social History Main Topics  . Smoking status: Never Smoker   . Smokeless tobacco: Never Used  . Alcohol Use: No  . Drug Use: No  . Sexual Activity: Not Asked   Other Topics Concern  . None   Social History Narrative   From [place] been [place] years lives with husband    Allergies: Allergies  Allergen Reactions  . Tramadol Hcl     REACTION: blurred vision    Outpatient Meds: Current Outpatient Prescriptions  Medication Sig Dispense Refill  . diphenhydramine-acetaminophen  (TYLENOL PM) 25-500 MG TABS tablet Take 1 tablet by mouth at bedtime as needed.    . pantoprazole (PROTONIX) 40 MG tablet Take 1 tablet (40 mg total) by mouth daily. 30 tablet 3   No current facility-administered medications for this visit.      ___________________________________________________________________ Objective  Exam:  BP 150/80 mmHg  Pulse 80  Ht 4' 11.84" (1.52 m)  Wt 127 lb 4 oz (57.72 kg)  BMI 24.98 kg/m2   General: this is a(n) Well-appearing elderly woman with good muscle mass   Eyes: sclera anicteric, no redness  ENT: oral mucosa moist without lesions, no cervical or supraclavicular lymphadenopathy, good dentition  CV: RRR without murmur, S1/S2, no JVD, no peripheral edema  Resp: clear to auscultation bilaterally, normal RR and effort noted  GI: soft, no tenderness, with active bowel sounds. No guarding or palpable organomegaly noted.  Skin; warm and dry, no rash or jaundice noted  Neuro: awake, alert and oriented x 3. Normal gross motor function and fluent speech  Labs:  H. pylori tests as noted above   Assessment: Encounter Diagnosis  Name Primary?  . Helicobacter pylori infection Yes    She has had complete resolution of the epigastric pain with clearance of the H. pylori infection  Plan:  I Col[place]an not think she needs any further tests at this point, she is also instructed to stop Protonix. She was given my card and will call me if she has recurrence of  symptoms.  Thank you for the courtesy of this consult.  Please call me with any questions or concerns.  Nelida Meuse III  CC: [name], [place]

## 2016-03-14 NOTE — Patient Instructions (Signed)
Follow up as needed. [id number]  If you are age 75 or older, your body mass index should be between 23-30. Your Body mass index is 24.98 kg/(m^2). If this is out of the aforementioned range listed, please consider follow up with your Primary Care Provider.  If you are age 76 or younger, your body mass index should be between 19-25. Your Body mass index is 24.98 kg/(m^2). If this is out of the aformentioned range listed, please consider follow up with your Primary Care Provider.   Thank you for choosing [facility]  Dr Wilfrid Lund III

## 2016-03-19 DIAGNOSIS — H2513 Age-related nuclear cataract, bilateral: Secondary | ICD-10-CM | POA: Diagnosis not present

## 2016-03-19 DIAGNOSIS — H40033 Anatomical narrow angle, bilateral: Secondary | ICD-10-CM | POA: Diagnosis not present

## 2016-03-29 DIAGNOSIS — H04123 Dry eye syndrome of bilateral lacrimal glands: Secondary | ICD-10-CM | POA: Diagnosis not present

## 2016-06-23 ENCOUNTER — Ambulatory Visit (HOSPITAL_BASED_OUTPATIENT_CLINIC_OR_DEPARTMENT_OTHER): Payer: Medicare Other

## 2016-06-23 ENCOUNTER — Other Ambulatory Visit (HOSPITAL_BASED_OUTPATIENT_CLINIC_OR_DEPARTMENT_OTHER): Payer: Medicare Other

## 2016-06-24 ENCOUNTER — Ambulatory Visit (HOSPITAL_BASED_OUTPATIENT_CLINIC_OR_DEPARTMENT_OTHER)
Admission: RE | Admit: 2016-06-24 | Discharge: 2016-06-24 | Disposition: A | Discharge status: Home / Self Care | Payer: Medicare Other | Source: Ambulatory Visit | Attending: Family Medicine | Admitting: Family Medicine

## 2016-06-24 DIAGNOSIS — M85852 Other specified disorders of bone density and structure, left thigh: Secondary | ICD-10-CM | POA: Insufficient documentation

## 2016-06-24 DIAGNOSIS — Z780 Asymptomatic menopausal state: Secondary | ICD-10-CM | POA: Insufficient documentation

## 2016-06-24 DIAGNOSIS — M810 Age-related osteoporosis without current pathological fracture: Secondary | ICD-10-CM | POA: Diagnosis not present

## 2016-06-24 DIAGNOSIS — M8588 Other specified disorders of bone density and structure, other site: Secondary | ICD-10-CM | POA: Diagnosis not present

## 2016-06-24 DIAGNOSIS — M85851 Other specified disorders of bone density and structure, right thigh: Secondary | ICD-10-CM | POA: Insufficient documentation

## 2016-06-24 DIAGNOSIS — Z1231 Encounter for screening mammogram for malignant neoplasm of breast: Secondary | ICD-10-CM | POA: Diagnosis not present

## 2016-06-24 DIAGNOSIS — M85869 Other specified disorders of bone density and structure, unspecified lower leg: Secondary | ICD-10-CM | POA: Diagnosis not present

## 2016-07-02 ENCOUNTER — Telehealth: Payer: Self-pay | Admitting: Family Medicine

## 2016-07-02 NOTE — Telephone Encounter (Signed)
Lm on vm to return call to schedule Annual Medicare Wellness with nurse

## 2016-10-08 ENCOUNTER — Telehealth: Payer: Self-pay | Admitting: Family Medicine

## 2016-10-08 NOTE — Telephone Encounter (Signed)
03/06/14 PR PPPS, SUBSEQ VISIT [id number] lvm advising patient to schedule medicare wellness.

## 2016-11-18 IMAGING — US US ABDOMEN COMPLETE
1 series · 14 of 25 positions shown · non-contrast
Comparison: Ultrasound abdomen 03/09/2014

CLINICAL DATA: Generalized abdominal pain. Right upper quadrant
pain.

EXAM:
ABDOMEN ULTRASOUND COMPLETE

[Series 1: us abdomen complete · 0.15mm/px · 14 of 77 slices shown]
[im 1/77]
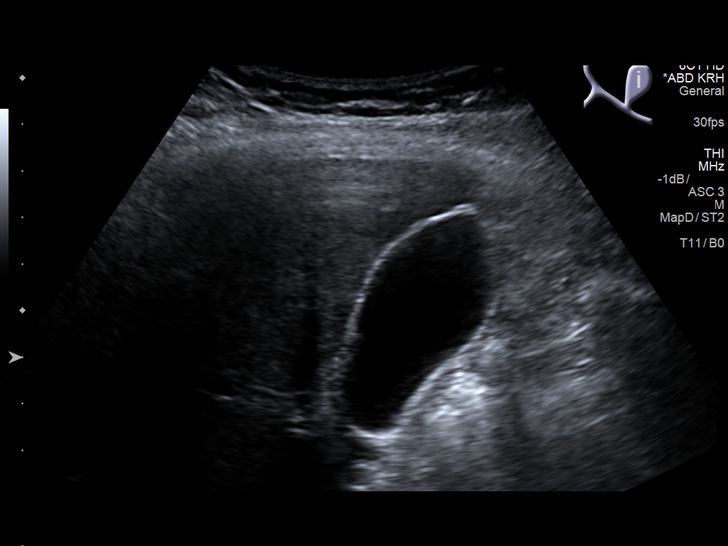
[im 7/77]
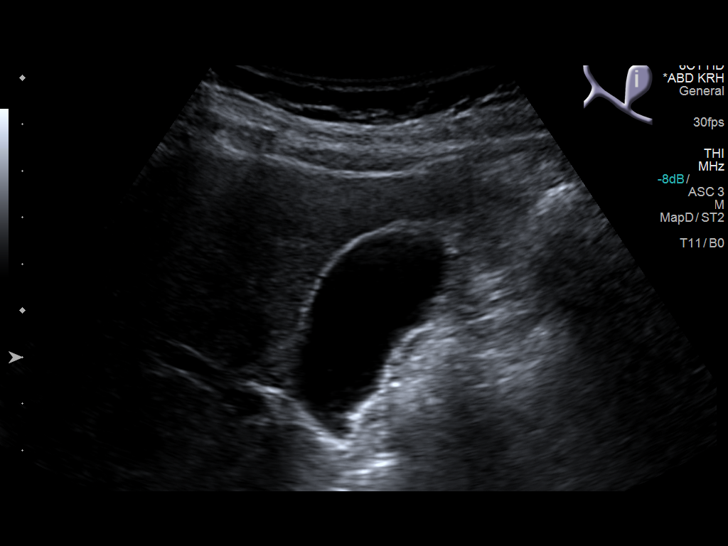
[im 13/77]
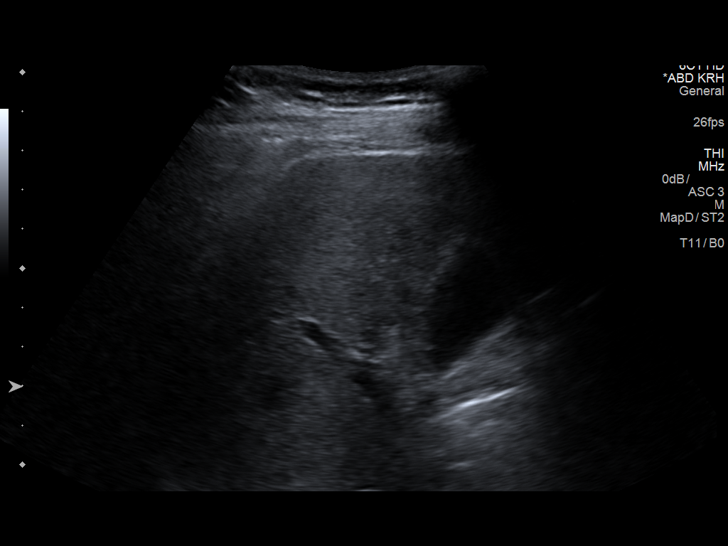
[im 20/77]
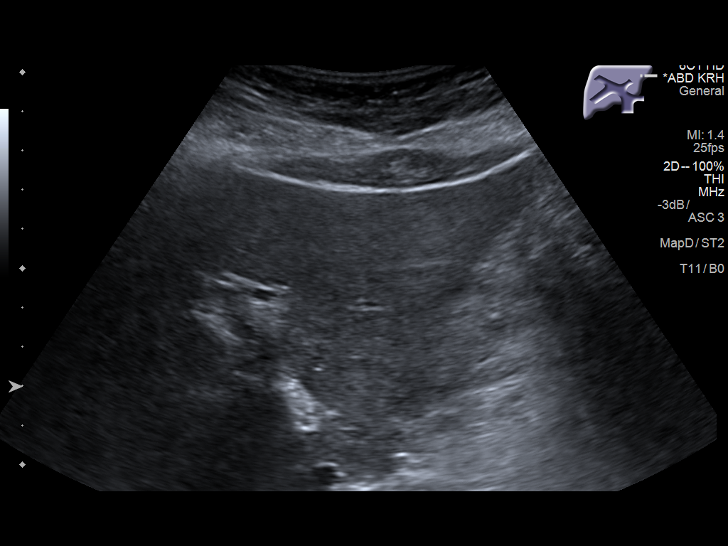
[im 26/77]
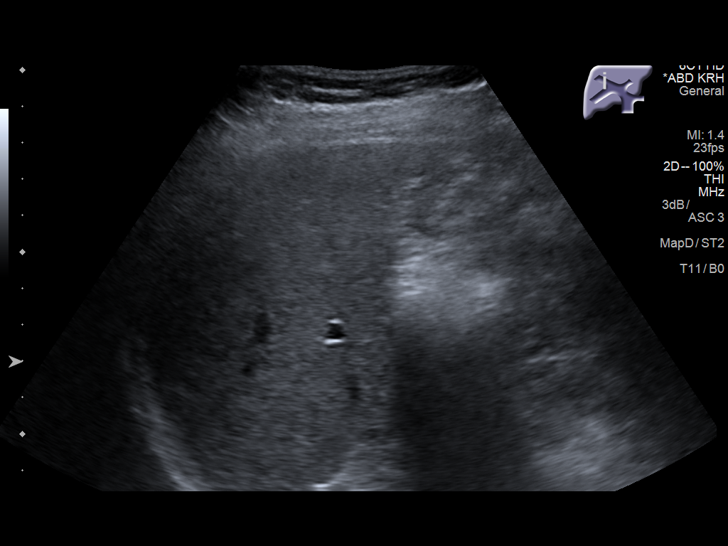
[im 29/77]
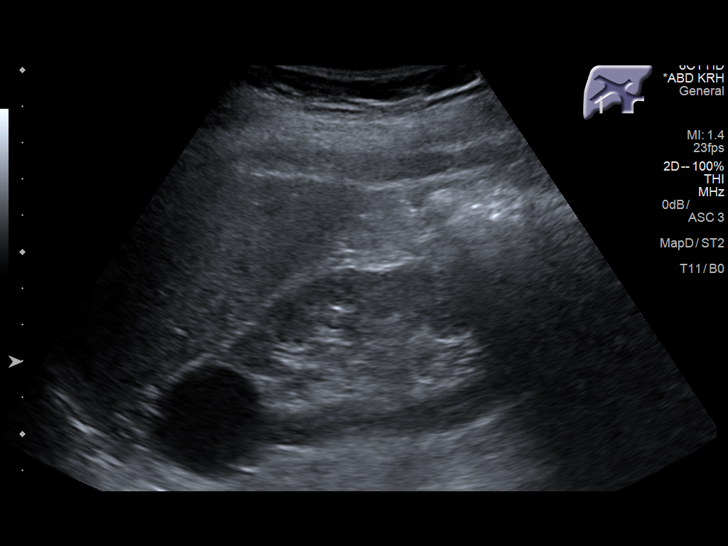
[im 35/77]
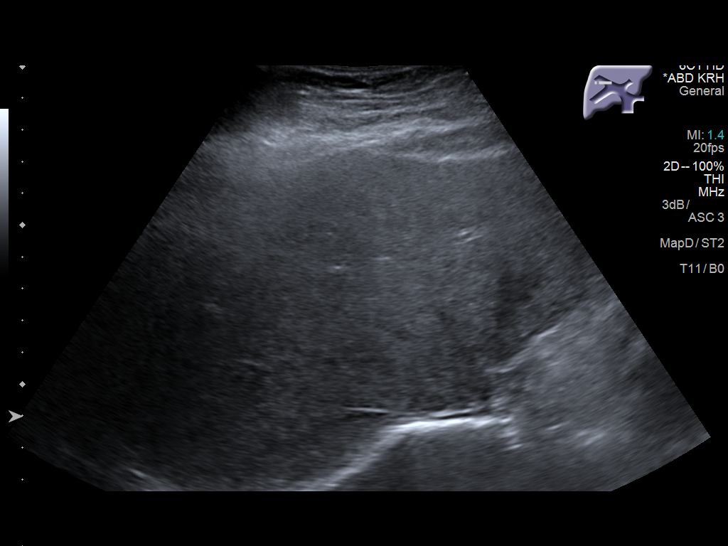
[im 42/77]
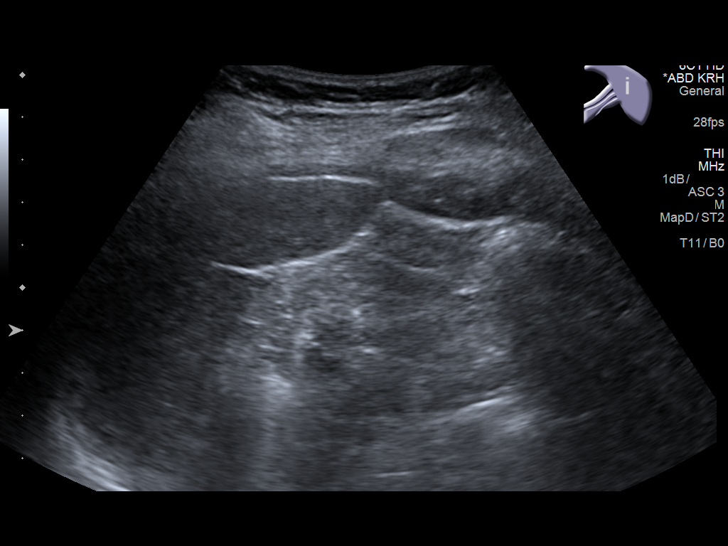
[im 48/77]
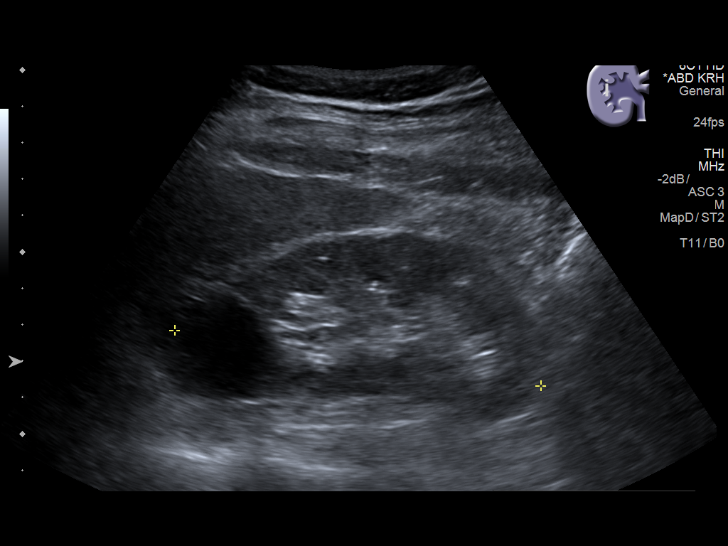
[im 51/77]
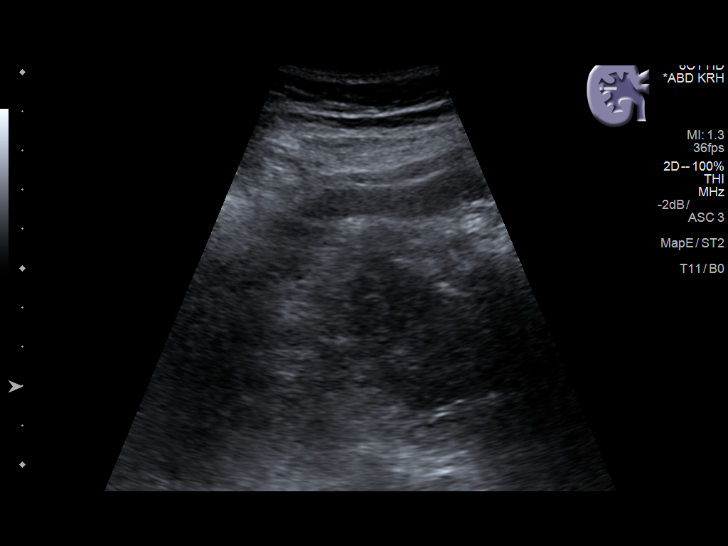
[im 58/77]
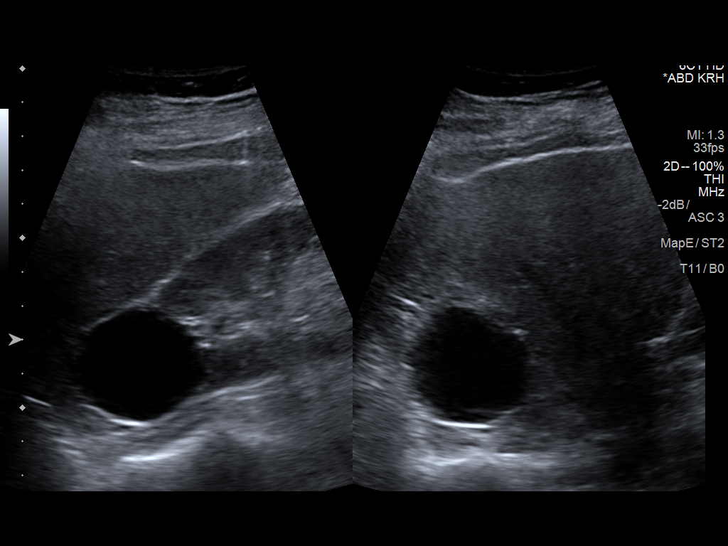
[im 64/77]
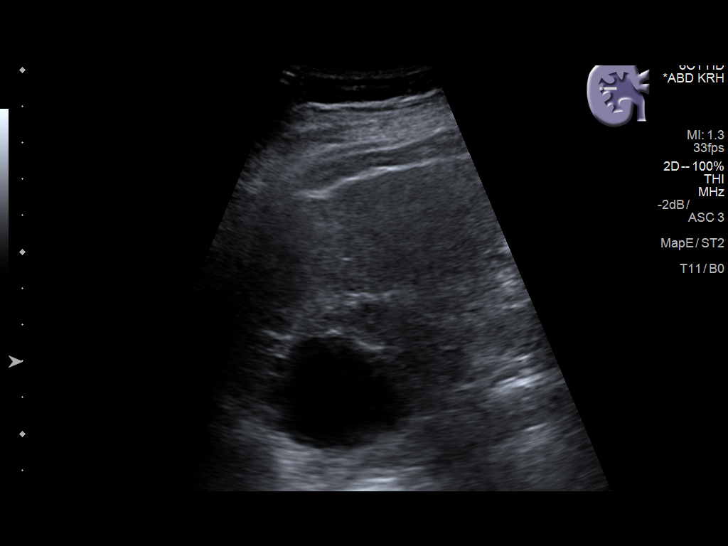
[im 70/77]
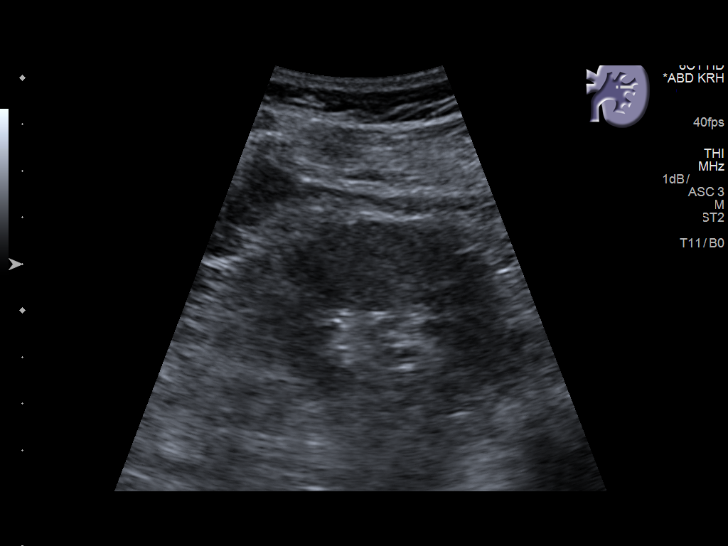
[im 77/77]
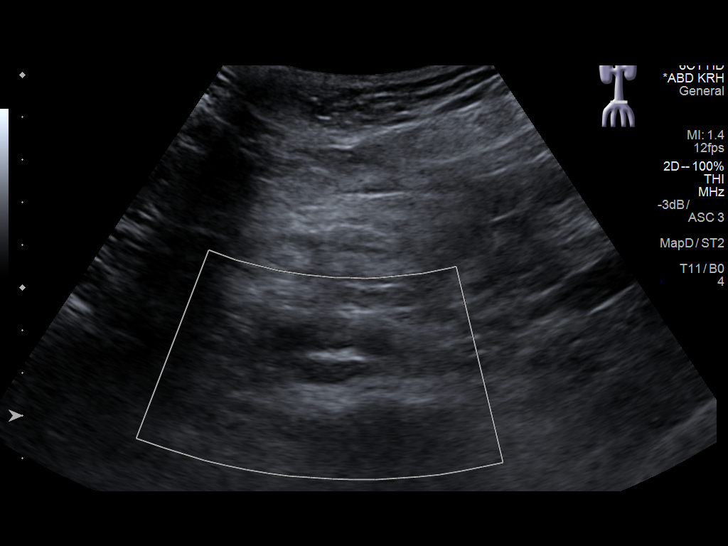

[14 of 25 positions shown; findings below may reference images not displayed]

FINDINGS: Gallbladder: No gallstones or wall thickening visualized. No
sonographic Murphy sign noted by sonographer.

Common bile duct: Diameter: 2 mm

Liver: No focal lesion identified. Within normal limits in
parenchymal echogenicity.

IVC: No abnormality visualized.

Pancreas: Visualized portion unremarkable.

Spleen: Size and appearance within normal limits.

Right Kidney: Length: 10.2 cm. Right upper pole cyst 3.8cm in
diameter. This is unchanged from the prior study.. Echogenicity
within normal limits. No mass or hydronephrosis visualized.

Left Kidney: Length: 9.6 cm. Echogenicity within normal limits. No
mass or hydronephrosis visualized.

Abdominal aorta: No aneurysm visualized.

Other findings: None.
IMPRESSION: No acute abnormality.  Negative for gallstones.  Right renal cyst.

## 2017-03-04 ENCOUNTER — Ambulatory Visit (INDEPENDENT_AMBULATORY_CARE_PROVIDER_SITE_OTHER): Payer: Medicare Other | Admitting: Gastroenterology

## 2017-03-04 ENCOUNTER — Encounter: Payer: Self-pay | Admitting: Gastroenterology

## 2017-03-04 ENCOUNTER — Encounter (INDEPENDENT_AMBULATORY_CARE_PROVIDER_SITE_OTHER): Payer: Self-pay

## 2017-03-04 VITALS — BP 130/70 | HR 70 | Ht 59.5 in | Wt 129.2 lb

## 2017-03-04 DIAGNOSIS — R140 Abdominal distension (gaseous): Secondary | ICD-10-CM

## 2017-03-04 NOTE — Progress Notes (Signed)
     [facility] Progress Note  Chief Complaint: Abdominal bloating  Subjective  History:  This is a 76 year old woman last seen by me a year ago. See that office note for details. She had developed some dyspepsia, was found to be H. pylori positive, and clearance was confirmed with breath testing after antibiotic treatment. By the time she saw me she was feeling well, no further testing or treatment was needed. She was seen with the aid of a video [place] interpreter. She describes some generalized abdominal bloating that occurs from time to time. It is difficult to tell how often based on her description. She says it is mostly like noise and bubbling. She denies dysphagia, odynophagia, nausea, vomiting, early satiety, loss of appetite or weight loss. He has a regular bowel movement 1 or 2 times a day with no blood. She will have rare episodes of postprandial. It apparently resolves without any medicines.  ROS: Cardiovascular:  no chest pain Respiratory: no dyspnea  The patient's Past Medical, Family and Social History were reviewed and are on file in the EMR.  Objective:  Med list reviewed  Vital signs in last 24 hrs: Vitals:   03/04/17 0920  BP: 130/70  Pulse: 70    Physical Exam  She is well-appearing, good muscle mass and normal affect.  HEENT: sclera anicteric, oral mucosa moist without lesions  Neck: supple, no thyromegaly, JVD or lymphadenopathy  Cardiac: RRR without murmurs, S1S2 heard, no peripheral edema  Pulm: clear to auscultation bilaterally, normal RR and effort noted  Abdomen: soft, no tenderness, with active bowel sounds. No guarding or palpable hepatosplenomegaly.  Skin; warm and dry, no jaundice or rash Entire encounter in the presence of our MA Jan and the video interpreter   @ASSESSMENTPLANBEGIN @ Assessment: Encounter Diagnosis  Name Primary?  . Abdominal bloating Yes   The symptoms sound benign. I don't think any further testing is  necessary. If she has worsening pain, blood in the stool, vomiting, loss of appetite or any other worrisome symptoms, she was encouraged to follow up with [place].   Total time 15 minutes, over half spent in counseling and coordination of care. Additional time was required for the entire encounter due to the need for interpreter services.  Nelida Meuse III

## 2017-03-04 NOTE — Patient Instructions (Signed)
If you are age 76 or older, your body mass index should be between 23-30. Your Body mass index is 25.66 kg/m. If this is out of the aforementioned range listed, please consider follow up with your Primary Care Provider.  If you are age 58 or younger, your body mass index should be between 19-25. Your Body mass index is 25.66 kg/m. If this is out of the aformentioned range listed, please consider follow up with your Primary Care Provider.   Thank you for choosing [facility]  Dr Wilfrid Lund III

## 2017-06-05 DIAGNOSIS — H2513 Age-related nuclear cataract, bilateral: Secondary | ICD-10-CM | POA: Diagnosis not present

## 2017-06-05 DIAGNOSIS — H40033 Anatomical narrow angle, bilateral: Secondary | ICD-10-CM | POA: Diagnosis not present

## 2017-06-16 DIAGNOSIS — H00021 Hordeolum internum right upper eyelid: Secondary | ICD-10-CM | POA: Diagnosis not present

## 2017-06-25 DIAGNOSIS — H00021 Hordeolum internum right upper eyelid: Secondary | ICD-10-CM | POA: Diagnosis not present

## 2017-08-11 ENCOUNTER — Encounter: Payer: Self-pay | Admitting: Family Medicine

## 2017-08-11 ENCOUNTER — Ambulatory Visit (INDEPENDENT_AMBULATORY_CARE_PROVIDER_SITE_OTHER): Payer: Medicare Other | Admitting: Family Medicine

## 2017-08-11 VITALS — BP 134/94 | HR 74 | Temp 97.5°F | Resp 16 | Ht 60.2 in | Wt 127.0 lb

## 2017-08-11 DIAGNOSIS — Z23 Encounter for immunization: Secondary | ICD-10-CM | POA: Diagnosis not present

## 2017-08-11 DIAGNOSIS — K219 Gastro-esophageal reflux disease without esophagitis: Secondary | ICD-10-CM

## 2017-08-11 DIAGNOSIS — Z0000 Encounter for general adult medical examination without abnormal findings: Secondary | ICD-10-CM

## 2017-08-11 DIAGNOSIS — Z136 Encounter for screening for cardiovascular disorders: Secondary | ICD-10-CM | POA: Diagnosis not present

## 2017-08-11 DIAGNOSIS — R21 Rash and other nonspecific skin eruption: Secondary | ICD-10-CM

## 2017-08-11 LAB — CBC WITH DIFFERENTIAL/PLATELET
BASOS ABS: 0 10*3/uL (ref 0.0–0.1)
BASOS PCT: 0.6 % (ref 0.0–3.0)
EOS ABS: 0.6 10*3/uL (ref 0.0–0.7)
Eosinophils Relative: 10.7 % — ABNORMAL HIGH (ref 0.0–5.0)
HEMATOCRIT: 40.1 % (ref 36.0–46.0)
HEMOGLOBIN: 13.3 g/dL (ref 12.0–15.0)
Lymphocytes Relative: 34.3 % (ref 12.0–46.0)
Lymphs Abs: 1.8 10*3/uL (ref 0.7–4.0)
MCHC: 33.2 g/dL (ref 30.0–36.0)
MCV: 92.4 fl (ref 78.0–100.0)
MONO ABS: 0.3 10*3/uL (ref 0.1–1.0)
Monocytes Relative: 6.4 % (ref 3.0–12.0)
Neutro Abs: 2.6 10*3/uL (ref 1.4–7.7)
Neutrophils Relative %: 48 % (ref 43.0–77.0)
PLATELETS: 272 10*3/uL (ref 150.0–400.0)
RBC: 4.34 Mil/uL (ref 3.87–5.11)
RDW: 12.8 % (ref 11.5–15.5)
WBC: 5.4 10*3/uL (ref 4.0–10.5)

## 2017-08-11 LAB — COMPREHENSIVE METABOLIC PANEL
ALK PHOS: 59 U/L (ref 39–117)
ALT: 13 U/L (ref 0–35)
AST: 20 U/L (ref 0–37)
Albumin: 4.8 g/dL (ref 3.5–5.2)
BILIRUBIN TOTAL: 0.7 mg/dL (ref 0.2–1.2)
BUN: 9 mg/dL (ref 6–23)
CALCIUM: 9.9 mg/dL (ref 8.4–10.5)
CO2: 30 mEq/L (ref 19–32)
CREATININE: 0.69 mg/dL (ref 0.40–1.20)
Chloride: 104 mEq/L (ref 96–112)
GFR: 87.82 mL/min (ref >60.00)
Glucose, Bld: 101 mg/dL — ABNORMAL HIGH (ref 70–99)
POTASSIUM: 4.2 meq/L (ref 3.5–5.1)
Sodium: 141 mEq/L (ref 135–145)
TOTAL PROTEIN: 8.1 g/dL (ref 6.0–8.3)

## 2017-08-11 LAB — LIPID PANEL
CHOL/HDL RATIO: 5
CHOLESTEROL: 187 mg/dL (ref 0–200)
HDL: 40.3 mg/dL (ref >39.00)
NONHDL: 147.02
TRIGLYCERIDES: 209 mg/dL — AB (ref 0.0–149.0)
VLDL: 41.8 mg/dL — ABNORMAL HIGH (ref 0.0–40.0)

## 2017-08-11 LAB — LDL CHOLESTEROL, DIRECT: Direct LDL: 126 mg/dL

## 2017-08-11 MED ORDER — PANTOPRAZOLE SODIUM 40 MG PO TBEC: 40.0000 mg | DELAYED_RELEASE_TABLET | Freq: Every day | ORAL | 3 refills | Status: DC

## 2017-08-11 MED ORDER — TRIAMCINOLONE ACETONIDE 0.1 % EX CREA: 1.0000 "application " | TOPICAL_CREAM | Freq: Two times a day (BID) | CUTANEOUS | 2 refills | Status: DC

## 2017-08-11 NOTE — Progress Notes (Signed)
Patient ID: YOANA STAIB, female    DOB: 1941/07/03  Age: 76 y.o. MRN: [id number]    Subjective:  Subjective  HPI Clotee T Memon presents for cpe.  Pt has no complaints.  Interpreter is present  Review of Systems  Constitutional: Negative for chills and fever.  HENT: Negative for congestion and hearing loss.   Eyes: Negative for discharge.  Respiratory: Negative for cough and shortness of breath.   Cardiovascular: Negative for chest pain, palpitations and leg swelling.  Gastrointestinal: Negative for abdominal pain, blood in stool, constipation, diarrhea, nausea and vomiting.  Genitourinary: Negative for dysuria, frequency, hematuria and urgency.  Musculoskeletal: Negative for back pain and myalgias.  Skin: Negative for rash.  Allergic/Immunologic: Negative for environmental allergies.  Neurological: Negative for dizziness, weakness and headaches.  Hematological: Does not bruise/bleed easily.  Psychiatric/Behavioral: Negative for suicidal ideas. The patient is not nervous/anxious.     History Past Medical History:  Diagnosis Date  . Arthritis   . H. pylori infection     She has a past surgical history that includes Tubal ligation.   Her family history includes Asthma in her brother; Stroke in her brother.She reports that she has never smoked. She has never used smokeless tobacco. She reports that she does not drink alcohol or use drugs.  Current Outpatient Prescriptions on File Prior to Visit  Medication Sig Dispense Refill  . diphenhydramine-acetaminophen (TYLENOL PM) 25-500 MG TABS tablet Take 1 tablet by mouth at bedtime as needed.     No current facility-administered medications on file prior to visit.      Objective:  Objective  Physical Exam  Constitutional: She is oriented to person, place, and time. She appears well-developed and well-nourished. No distress.  HENT:  Head: Normocephalic and atraumatic.  Right Ear: External ear normal.  Left Ear: External ear normal.    Nose: Nose normal.  Mouth/Throat: Oropharynx is clear and moist.  Eyes: Pupils are equal, round, and reactive to light. Conjunctivae and EOM are normal.  Neck: Normal range of motion. Neck supple. No JVD present. Carotid bruit is not present. No thyromegaly present.  Cardiovascular: Normal rate, regular rhythm and normal heart sounds.   No murmur heard. Pulmonary/Chest: Effort normal and breath sounds normal. No respiratory distress. She has no wheezes. She has no rales. She exhibits no tenderness.  Abdominal: Soft. She exhibits no distension and no mass. There is no tenderness. There is no rebound and no guarding.  Genitourinary: No breast swelling, tenderness, discharge or bleeding.  Musculoskeletal: She exhibits no edema.  Neurological: She is alert and oriented to person, place, and time.  Psychiatric: She has a normal mood and affect. Her behavior is normal. Judgment and thought content normal.  Nursing note and vitals reviewed.  BP (!) 170/90 (BP Location: Right Arm, Patient Position: Sitting, Cuff Size: Normal)   Pulse 74   Temp (!) 97.5 F (36.4 C) (Oral)   Resp 16   Ht 5' 0.2" (1.529 m)   Wt 127 lb (57.6 kg)   SpO2 98%   BMI 24.64 kg/m  Wt Readings from Last 3 Encounters:  08/11/17 127 lb (57.6 kg)  03/04/17 129 lb 3.2 oz (58.6 kg)  03/14/16 127 lb 4 oz (57.7 kg)     Lab Results  Component Value Date   WBC 6.6 01/25/2016   HGB 14.3 01/25/2016   HCT 42.3 01/25/2016   PLT 239 01/25/2016   GLUCOSE 88 01/25/2016   CHOL 199 09/11/2015   TRIG 196.0 (H) 09/11/2015  HDL 42.90 09/11/2015   LDLDIRECT 140.0 03/02/2013   LDLCALC 117 (H) 09/11/2015   ALT 12 01/14/2016   AST 19 01/14/2016   NA 140 01/25/2016   K 3.9 01/25/2016   CL 107 01/25/2016   CREATININE 0.77 01/25/2016   BUN 13 01/25/2016   CO2 23 01/25/2016   TSH 2.36 02/18/2016    Dg Bone Density  Result Date: 06/24/2016 EXAM: DUAL X-RAY ABSORPTIOMETRY (DXA) FOR BONE MINERAL DENSITY IMPRESSION: Referring  Physician:  Rosalita Chessman CHASE PATIENT: Name: Maury, Bamba Patient ID: [id number] Birth Date: 1940/11/08 Height: 60.0 in. Sex: Female Measured: 06/24/2016 Weight: 127.4 lbs. Indications: Advanced Age, Estrogen Deficiency, History of Osteoporosis, Post Menopausal, Vietnamese Fractures: Treatments: Calcium, Vitamin D ASSESSMENT: The BMD measured at AP Spine L1-L2 is 0.910 g/cm2 with a T-score of -2.1. This patient is considered osteopenic according to [facility]) criteria. L-3 & 4 was excluded due to degenerative changes. Site Region Measured Date Measured Age WHO YA BMD Classification T-score AP Spine L1-L2 06/24/2016 75.2 Osteopenia -2.1 0.910 g/cm2 DualFemur Total Mean 06/24/2016 75.2 years Osteopenia -1.3 0.848 g/cm2 World Health Organization [facility]) criteria for post-menopausal, Caucasian Women: Normal       T-score at or above -1 SD Osteopenia   T-score between -1 and -2.5 SD Osteoporosis T-score at or below -2.5 SD RECOMMENDATION: [place] recommends that FDA-approved medical therapies be considered in postmenopausal women and men age 47 or older with a: 1. Hip or vertebral (clinical or morphometric) fracture. 2. T-score of < -2.5 at the spine or hip. 3. Ten-year fracture probability by FRAX of 3% or greater for hip fracture or 20% or greater for major osteoporotic fracture. All treatment decisions require clinical judgment and consideration of individual patient factors, including patient preferences, co-morbidities, previous drug use, risk factors not captured in the FRAX model (e.g. falls, vitamin D deficiency, increased bone turnover, interval significant decline in bone density) and possible under - or over-estimation of fracture risk by FRAX. All patients should ensure an adequate intake of dietary calcium (1200 mg/d) and vitamin D (800 IU daily) unless contraindicated. FOLLOW-UP: People with diagnosed cases of osteoporosis or at high risk for fracture should have  regular bone mineral density tests. For patients eligible for Medicare, routine testing is allowed once every 2 years. The testing frequency can be increased to one year for patients who have rapidly progressing disease, those who are receiving or discontinuing medical therapy to restore bone mass, or have additional risk factors. I have reviewed this report and agree with the above findings. [facility] Radiology Referring Physician:  Rosalita Chessman CHASE PATIENT: Name: Reizy, Dunlow Patient ID: [id number] Birth Date: 1940-12-27 Height: 60.0 in. Sex: Female Measured: 06/24/2016 Weight: 127.4 lbs. Indications: Advanced Age, Estrogen Deficiency, History of Osteoporosis, Post Menopausal, Vietnamese Fractures: Treatments: Calcium, Vitamin D ASSESSMENT: The BMD measured at AP Spine L1-L2 is 0.910 g/cm2 with a T-score of -2.1. This patient is considered osteopenic according to [facility]) criteria. L-3 & 4 was excluded due to degenerative changes. Site Region Measured Date Measured Age WHO YA BMD Classification T-score AP Spine L1-L2 06/24/2016 75.2 Osteopenia -2.1 0.910 g/cm2 DualFemur Total Mean 06/24/2016 75.2 years Osteopenia -1.3 0.848 g/cm2 World Health Organization [facility]) criteria for post-menopausal, Caucasian Women: Normal       T-score at or above -1 SD Osteopenia   T-score between -1 and -2.5 SD Osteoporosis T-score at or below -2.5 SD RECOMMENDATION: [place] recommends that FDA-approved medical therapies  be considered in postmenopausal women and men age 5 or older with a: 1. Hip or vertebral (clinical or morphometric) fracture. 2. T-score of < -2.5 at the spine or hip. 3. Ten-year fracture probability by FRAX of 3% or greater for hip fracture or 20% or greater for major osteoporotic fracture. All treatment decisions require clinical judgment and consideration of individual patient factors, including patient preferences, co-morbidities, previous drug use, risk factors not  captured in the FRAX model (e.g. falls, vitamin D deficiency, increased bone turnover, interval significant decline in bone density) and possible under - or over-estimation of fracture risk by FRAX. All patients should ensure an adequate intake of dietary calcium (1200 mg/d) and vitamin D (800 IU daily) unless contraindicated. FOLLOW-UP: People with diagnosed cases of osteoporosis or at high risk for fracture should have regular bone mineral density tests. For patients eligible for Medicare, routine testing is allowed once every 2 years. The testing frequency can be increased to one year for patients who have rapidly progressing disease, those who are receiving or discontinuing medical therapy to restore bone mass, or have additional risk factors. I have reviewed this report and agree with the above findings. [facility] Radiology Patient: Rachel Bo Referring Physician: Rosalita Chessman CHASE Birth Date: 1941-06-30 [age] Patient ID: [id number] Height: 60.0 in. Weight: 127.4 lbs. Measured: 06/24/2016 9:22:51 AM (16 SP 2) Sex: Female Ethnicity: White Analyzed: 06/24/2016 9:24:24 AM (16 SP 2) FRAX* 10-year Probability of Fracture Based on femoral neck BMD: DualFemur (Left) Major Osteoporotic Fracture: - Hip Fracture:                - Population:                  [place] (Caucasian) Risk Factors:                None *FRAX is a Materials engineer of the [employer] for Metabolic Bone Disease, a World Pharmacologist (WHO) [facility]. ASSESSMENT: PLEASE ENTER ASSESSMENT TEXT. Electronically Signed   By: Kerby Moors M.D.   On: 06/24/2016 11:30   Mm Digital Screening Bilateral  Result Date: 06/24/2016 CLINICAL DATA:  Screening. EXAM: DIGITAL SCREENING BILATERAL MAMMOGRAM WITH CAD COMPARISON:  Previous exam(s). ACR Breast Density Category b: There are scattered areas of fibroglandular density. FINDINGS: There are no findings suspicious for malignancy. Images were  processed with CAD. IMPRESSION: No mammographic evidence of malignancy. A result letter of this screening mammogram will be mailed directly to the patient. RECOMMENDATION: Screening mammogram in one year. (Code:SM-B-01Y) BI-RADS CATEGORY  1: Negative. Electronically Signed   By: Dorise Bullion III M.D   On: 06/25/2016 13:42     Assessment & Plan:  Plan  I am having Ms. Remick start on triamcinolone cream. I am also having her maintain her diphenhydramine-acetaminophen and pantoprazole.  Meds ordered this encounter  Medications  . pantoprazole (PROTONIX) 40 MG tablet    Sig: Take 1 tablet (40 mg total) by mouth daily.    Dispense:  90 tablet    Refill:  3  . triamcinolone cream (KENALOG) 0.1 %    Sig: Apply 1 application topically 2 (two) times daily.    Dispense:  454 g    Refill:  2    Problem List Items Addressed This Visit      Unprioritized   GERD   Relevant Medications   pantoprazole (PROTONIX) 40 MG tablet   Other Relevant Orders  CBC with Differential/Platelet   Lipid panel   Comprehensive metabolic panel   POCT Urinalysis Dipstick (Automated)   Preventative health care - Primary    ghm utd Check labs See AVS High dose flu given       Relevant Orders   CBC with Differential/Platelet   Lipid panel   Comprehensive metabolic panel   POCT Urinalysis Dipstick (Automated)    Other Visit Diagnoses    Need for influenza vaccination       Relevant Orders   Flu vaccine HIGH DOSE PF (Fluzone High dose) (Completed)   Ischemic heart disease screen       Relevant Orders   CBC with Differential/Platelet   Lipid panel   Comprehensive metabolic panel   POCT Urinalysis Dipstick (Automated)   Rash       Relevant Medications   triamcinolone cream (KENALOG) 0.1 %      Follow-up: Return in about 1 year (around 08/11/2018) for fasting.  [name], [place]

## 2017-08-11 NOTE — Assessment & Plan Note (Signed)
ghm utd Check labs See AVS High dose flu given

## 2017-08-11 NOTE — Patient Instructions (Signed)
Preventive Care 65 Years and Older, Female Preventive care refers to lifestyle choices and visits with your health care provider that can promote health and wellness. What does preventive care include?  A yearly physical exam. This is also called an annual well check.  Dental exams once or twice a year.  Routine eye exams. Ask your health care provider how often you should have your eyes checked.  Personal lifestyle choices, including: ? Daily care of your teeth and gums. ? Regular physical activity. ? Eating a healthy diet. ? Avoiding tobacco and drug use. ? Limiting alcohol use. ? Practicing safe sex. ? Taking low-dose aspirin every day. ? Taking vitamin and mineral supplements as recommended by your health care provider. What happens during an annual well check? The services and screenings done by your health care provider during your annual well check will depend on your age, overall health, lifestyle risk factors, and family history of disease. Counseling Your health care provider may ask you questions about your:  Alcohol use.  Tobacco use.  Drug use.  Emotional well-being.  Home and relationship well-being.  Sexual activity.  Eating habits.  History of falls.  Memory and ability to understand (cognition).  Work and work environment.  Reproductive health.  Screening You may have the following tests or measurements:  Height, weight, and BMI.  Blood pressure.  Lipid and cholesterol levels. These may be checked every 5 years, or more frequently if you are over 50 years old.  Skin check.  Lung cancer screening. You may have this screening every year starting at age 55 if you have a 30-pack-year history of smoking and currently smoke or have quit within the past 15 years.  Fecal occult blood test (FOBT) of the stool. You may have this test every year starting at age 50.  Flexible sigmoidoscopy or colonoscopy. You may have a sigmoidoscopy every 5 years or  a colonoscopy every 10 years starting at age 50.  Hepatitis C blood test.  Hepatitis B blood test.  Sexually transmitted disease (STD) testing.  Diabetes screening. This is done by checking your blood sugar (glucose) after you have not eaten for a while (fasting). You may have this done every 1-3 years.  Bone density scan. This is done to screen for osteoporosis. You may have this done starting at age 65.  Mammogram. This may be done every 1-2 years. Talk to your health care provider about how often you should have regular mammograms.  Talk with your health care provider about your test results, treatment options, and if necessary, the need for more tests. Vaccines Your health care provider may recommend certain vaccines, such as:  Influenza vaccine. This is recommended every year.  Tetanus, diphtheria, and acellular pertussis (Tdap, Td) vaccine. You may need a Td booster every 10 years.  Varicella vaccine. You may need this if you have not been vaccinated.  Zoster vaccine. You may need this after age 60.  Measles, mumps, and rubella (MMR) vaccine. You may need at least one dose of MMR if you were born in 1957 or later. You may also need a second dose.  Pneumococcal 13-valent conjugate (PCV13) vaccine. One dose is recommended after age 65.  Pneumococcal polysaccharide (PPSV23) vaccine. One dose is recommended after age 65.  Meningococcal vaccine. You may need this if you have certain conditions.  Hepatitis A vaccine. You may need this if you have certain conditions or if you travel or work in places where you may be exposed to hepatitis   A.  Hepatitis B vaccine. You may need this if you have certain conditions or if you travel or work in places where you may be exposed to hepatitis B.  Haemophilus influenzae type b (Hib) vaccine. You may need this if you have certain conditions.  Talk to your health care provider about which screenings and vaccines you need and how often you  need them. This information is not intended to replace advice given to you by your health care provider. Make sure you discuss any questions you have with your health care provider. Document Released: 11/16/2015 Document Revised: 07/09/2016 Document Reviewed: 08/21/2015 Elsevier Interactive Patient Education  2017 [employer].

## 2017-10-02 DIAGNOSIS — H04123 Dry eye syndrome of bilateral lacrimal glands: Secondary | ICD-10-CM | POA: Diagnosis not present

## 2018-02-02 ENCOUNTER — Encounter: Payer: Self-pay | Admitting: Family Medicine

## 2018-02-02 ENCOUNTER — Ambulatory Visit (INDEPENDENT_AMBULATORY_CARE_PROVIDER_SITE_OTHER): Payer: Medicare Other | Admitting: Family Medicine

## 2018-02-02 VITALS — BP 138/88 | HR 90 | Temp 98.1°F | Resp 16 | Ht 60.0 in | Wt 126.8 lb

## 2018-02-02 DIAGNOSIS — J324 Chronic pansinusitis: Secondary | ICD-10-CM | POA: Diagnosis not present

## 2018-02-02 DIAGNOSIS — R059 Cough, unspecified: Secondary | ICD-10-CM

## 2018-02-02 DIAGNOSIS — R05 Cough: Secondary | ICD-10-CM

## 2018-02-02 MED ORDER — PROMETHAZINE-DM 6.25-15 MG/5ML PO SYRP
5.0000 mL | ORAL_SOLUTION | Freq: Four times a day (QID) | ORAL | 0 refills | Status: DC | PRN
Start: 2018-02-02 — End: 2018-08-16

## 2018-02-02 MED ORDER — DOXYCYCLINE HYCLATE 100 MG PO TABS: 100.0000 mg | ORAL_TABLET | Freq: Two times a day (BID) | ORAL | 0 refills | Status: DC

## 2018-02-02 MED ORDER — FLUTICASONE PROPIONATE 50 MCG/ACT NA SUSP: 2.0000 | Freq: Every day | NASAL | 6 refills | Status: DC

## 2018-02-02 MED ORDER — LORATADINE 10 MG PO TABS
10.0000 mg | ORAL_TABLET | Freq: Every day | ORAL | 11 refills | Status: DC
Start: 2018-02-02 — End: 2018-09-27

## 2018-02-02 MED ORDER — HYDROCORTISONE 2.5 % RE CREA: 1.0000 "application " | TOPICAL_CREAM | Freq: Two times a day (BID) | RECTAL | 0 refills | Status: DC

## 2018-02-02 NOTE — Patient Instructions (Signed)

## 2018-02-02 NOTE — Progress Notes (Signed)
Patient ID: Ariel Williams, female   DOB: 1941-03-05, 77 y.o.   MRN: [id number]    Subjective:  I acted as a Education administrator for Dr. Carollee Herter.  Guerry Bruin, [place]   Patient ID: Ariel Williams, female    DOB: 04-26-1941, 77 y.o.   MRN: [id number]  Chief Complaint  Patient presents with  . Cough    Cough  This is a new problem. Episode onset: 3-4 days ago. The cough is productive of sputum. Associated symptoms include headaches, nasal congestion, rhinorrhea and a sore throat. Pertinent negatives include no chills, ear congestion or ear pain. Treatments tried: mucinex.    Patient is in today for cough.  translater is on video phone.   Patient Care Team: Carollee Herter, Alferd Apa, Rocha as PCP - General   Past Medical History:  Diagnosis Date  . Arthritis   . H. pylori infection     Past Surgical History:  Procedure Laterality Date  . TUBAL LIGATION      Family History  Problem Relation Age of Onset  . Asthma Brother   . Stroke Brother     Social History   Socioeconomic History  . Marital status: Married    Spouse name: Not on file  . Number of children: 53  . Years of education: Not on file  . Highest education level: Not on file  Occupational History  . Not on file  Social Needs  . Financial resource strain: Not on file  . Food insecurity:    Worry: Not on file    Inability: Not on file  . Transportation needs:    Medical: Not on file    Non-medical: Not on file  Tobacco Use  . Smoking status: Never Smoker  . Smokeless tobacco: Never Used  Substance and Sexual Activity  . Alcohol use: No    Alcohol/week: 0.0 oz  . Drug use: No  . Sexual activity: Not on file  Lifestyle  . Physical activity:    Days per week: Not on file    Minutes per session: Not on file  . Stress: Not on file  Relationships  . Social connections:    Talks on phone: Not on file    Gets together: Not on file    Attends religious service: Not on file    Active member of club or organization: Not on file   Attends meetings of clubs or organizations: Not on file    Relationship status: Not on file  . Intimate partner violence:    Fear of current or ex partner: Not on file    Emotionally abused: Not on file    Physically abused: Not on file    Forced sexual activity: Not on file  Other Topics Concern  . Not on file  Social History Narrative   From [name] been 64 years lives with husband    Outpatient Medications Prior to Visit  Medication Sig Dispense Refill  . diphenhydramine-acetaminophen (TYLENOL PM) 25-500 MG TABS tablet Take 1 tablet by mouth at bedtime as needed.    . pantoprazole (PROTONIX) 40 MG tablet Take 1 tablet (40 mg total) by mouth daily. 90 tablet 3  . triamcinolone cream (KENALOG) 0.1 % Apply 1 application topically 2 (two) times daily. 454 g 2   No facility-administered medications prior to visit.     Allergies  Allergen Reactions  . Tramadol Hcl     REACTION: blurred vision    Review of Systems  Constitutional: Negative for chills.  HENT:  Positive for rhinorrhea and sore throat. Negative for ear pain.   Respiratory: Positive for cough.   Neurological: Positive for headaches.       Objective:    Physical Exam  Constitutional: She is oriented to person, place, and time. She appears well-developed and well-nourished.  HENT:  Head: Normocephalic and atraumatic.  Right Ear: External ear normal.  Left Ear: External ear normal.  Nose: Right sinus exhibits maxillary sinus tenderness and frontal sinus tenderness. Left sinus exhibits maxillary sinus tenderness and frontal sinus tenderness.  Mouth/Throat: Posterior oropharyngeal erythema present. No oropharyngeal exudate.  + PND + errythema  Eyes: Conjunctivae and EOM are normal. Right eye exhibits no discharge. Left eye exhibits no discharge.  Neck: Normal range of motion. Neck supple. No JVD present. Carotid bruit is not present. No thyromegaly present.  Cardiovascular: Normal rate, regular rhythm and normal  heart sounds.  No murmur heard. Pulmonary/Chest: Effort normal and breath sounds normal. No respiratory distress. She has no wheezes. She has no rales. She exhibits no tenderness.  Musculoskeletal: She exhibits no edema.  Lymphadenopathy:    She has cervical adenopathy.  Neurological: She is alert and oriented to person, place, and time.  Psychiatric: She has a normal mood and affect.  Nursing note and vitals reviewed.   BP 138/88 (Cuff Size: Normal)   Pulse 90   Temp 98.1 F (36.7 C) (Oral)   Resp 16   Ht 5' (1.524 m)   Wt 126 lb 12.8 oz (57.5 kg)   SpO2 98%   BMI 24.76 kg/m  Wt Readings from Last 3 Encounters:  02/02/18 126 lb 12.8 oz (57.5 kg)  08/11/17 127 lb (57.6 kg)  03/04/17 129 lb 3.2 oz (58.6 kg)   BP Readings from Last 3 Encounters:  02/02/18 138/88  08/11/17 (!) 134/94  03/04/17 130/70     Immunization History  Administered Date(s) Administered  . Influenza Split 08/05/2012  . Influenza Whole 11/19/2009, 07/17/2010, 07/19/2011  . Influenza, High Dose Seasonal PF 08/11/2017  . Influenza,inj,Quad PF,6+ Mos 11/18/2013, 11/22/2015  . Pneumococcal Conjugate-13 08/11/2017  . Pneumococcal Polysaccharide-23 07/26/2007    Health Maintenance  Topic Date Due  . TETANUS/TDAP  03/12/1960  . MAMMOGRAM  06/24/2017  . INFLUENZA VACCINE  06/03/2018  . DEXA SCAN  Completed  . PNA vac Low Risk Adult  Completed    Lab Results  Component Value Date   WBC 5.4 08/11/2017   HGB 13.3 08/11/2017   HCT 40.1 08/11/2017   PLT 272.0 08/11/2017   GLUCOSE 101 (H) 08/11/2017   CHOL 187 08/11/2017   TRIG 209.0 (H) 08/11/2017   HDL 40.30 08/11/2017   LDLDIRECT 126.0 08/11/2017   LDLCALC 117 (H) 09/11/2015   ALT 13 08/11/2017   AST 20 08/11/2017   NA 141 08/11/2017   K 4.2 08/11/2017   CL 104 08/11/2017   CREATININE 0.69 08/11/2017   BUN 9 08/11/2017   CO2 30 08/11/2017   TSH 2.36 02/18/2016    Lab Results  Component Value Date   TSH 2.36 02/18/2016   Lab Results    Component Value Date   WBC 5.4 08/11/2017   HGB 13.3 08/11/2017   HCT 40.1 08/11/2017   MCV 92.4 08/11/2017   PLT 272.0 08/11/2017   Lab Results  Component Value Date   NA 141 08/11/2017   K 4.2 08/11/2017   CO2 30 08/11/2017   GLUCOSE 101 (H) 08/11/2017   BUN 9 08/11/2017   CREATININE 0.69 08/11/2017   BILITOT 0.7 08/11/2017  ALKPHOS 59 08/11/2017   AST 20 08/11/2017   ALT 13 08/11/2017   PROT 8.1 08/11/2017   ALBUMIN 4.8 08/11/2017   CALCIUM 9.9 08/11/2017   ANIONGAP 10 01/25/2016   GFR 87.82 08/11/2017   Lab Results  Component Value Date   CHOL 187 08/11/2017   Lab Results  Component Value Date   HDL 40.30 08/11/2017   Lab Results  Component Value Date   LDLCALC 117 (H) 09/11/2015   Lab Results  Component Value Date   TRIG 209.0 (H) 08/11/2017   Lab Results  Component Value Date   CHOLHDL 5 08/11/2017   No results found for: HGBA1C       Assessment & Plan:   Problem List Items Addressed This Visit    None    Visit Diagnoses    Pansinusitis, unspecified chronicity    -  Primary   Relevant Medications   doxycycline (VIBRA-TABS) 100 MG tablet   fluticasone (FLONASE) 50 MCG/ACT nasal spray   loratadine (CLARITIN) 10 MG tablet   promethazine-dextromethorphan (PROMETHAZINE-DM) 6.25-15 MG/5ML syrup   Cough       Relevant Medications   promethazine-dextromethorphan (PROMETHAZINE-DM) 6.25-15 MG/5ML syrup    1. Pansinusitis, unspecified chronicity See above - doxycycline (VIBRA-TABS) 100 MG tablet; Take 1 tablet (100 mg total) by mouth 2 (two) times daily.  Dispense: 20 tablet; Refill: 0 - fluticasone (FLONASE) 50 MCG/ACT nasal spray; Place 2 sprays into both nostrils daily.  Dispense: 16 g; Refill: 6 - loratadine (CLARITIN) 10 MG tablet; Take 1 tablet (10 mg total) by mouth daily.  Dispense: 30 tablet; Refill: 11  2. Cough - promethazine-dextromethorphan (PROMETHAZINE-DM) 6.25-15 MG/5ML syrup; Take 5 mLs by mouth 4 (four) times daily as needed.   Dispense: 118 mL; Refill: 0 Pt warned the med may cause drowsiness  I have discontinued Seth T. Weldy's pantoprazole and triamcinolone cream. I am also having her start on doxycycline, fluticasone, loratadine, promethazine-dextromethorphan, and hydrocortisone. Additionally, I am having her maintain her diphenhydramine-acetaminophen.  Meds ordered this encounter  Medications  . doxycycline (VIBRA-TABS) 100 MG tablet    Sig: Take 1 tablet (100 mg total) by mouth 2 (two) times daily.    Dispense:  20 tablet    Refill:  0  . fluticasone (FLONASE) 50 MCG/ACT nasal spray    Sig: Place 2 sprays into both nostrils daily.    Dispense:  16 g    Refill:  6  . loratadine (CLARITIN) 10 MG tablet    Sig: Take 1 tablet (10 mg total) by mouth daily.    Dispense:  30 tablet    Refill:  11  . promethazine-dextromethorphan (PROMETHAZINE-DM) 6.25-15 MG/5ML syrup    Sig: Take 5 mLs by mouth 4 (four) times daily as needed.    Dispense:  118 mL    Refill:  0  . hydrocortisone (ANUSOL-HC) 2.5 % rectal cream    Sig: Place 1 application rectally 2 (two) times daily.    Dispense:  30 g    Refill:  0    CMA served as scribe during this visit. History, Physical and Plan performed by medical provider. Documentation and orders reviewed and attested to.  [name], [place]

## 2018-08-16 ENCOUNTER — Encounter: Payer: Self-pay | Admitting: Family Medicine

## 2018-08-16 ENCOUNTER — Ambulatory Visit (INDEPENDENT_AMBULATORY_CARE_PROVIDER_SITE_OTHER): Payer: Medicare Other | Admitting: Family Medicine

## 2018-08-16 VITALS — BP 158/71 | HR 70 | Temp 98.6°F | Resp 16 | Ht 60.0 in | Wt 125.2 lb

## 2018-08-16 DIAGNOSIS — L509 Urticaria, unspecified: Secondary | ICD-10-CM

## 2018-08-16 DIAGNOSIS — Z23 Encounter for immunization: Secondary | ICD-10-CM | POA: Diagnosis not present

## 2018-08-16 DIAGNOSIS — I10 Essential (primary) hypertension: Secondary | ICD-10-CM | POA: Diagnosis not present

## 2018-08-16 LAB — LIPID PANEL
CHOL/HDL RATIO: 4
Cholesterol: 202 mg/dL — ABNORMAL HIGH (ref 0–200)
HDL: 46.9 mg/dL (ref >39.00)
LDL Cholesterol: 119 mg/dL — ABNORMAL HIGH (ref 0–99)
NONHDL: 154.78
TRIGLYCERIDES: 179 mg/dL — AB (ref 0.0–149.0)
VLDL: 35.8 mg/dL (ref 0.0–40.0)

## 2018-08-16 LAB — COMPREHENSIVE METABOLIC PANEL
ALK PHOS: 59 U/L (ref 39–117)
ALT: 13 U/L (ref 0–35)
AST: 19 U/L (ref 0–37)
Albumin: 4.8 g/dL (ref 3.5–5.2)
BUN: 14 mg/dL (ref 6–23)
CHLORIDE: 104 meq/L (ref 96–112)
CO2: 31 mEq/L (ref 19–32)
Calcium: 9.5 mg/dL (ref 8.4–10.5)
Creatinine, Ser: 0.63 mg/dL (ref 0.40–1.20)
GFR: 97.28 mL/min (ref >60.00)
GLUCOSE: 106 mg/dL — AB (ref 70–99)
POTASSIUM: 4.5 meq/L (ref 3.5–5.1)
SODIUM: 140 meq/L (ref 135–145)
Total Bilirubin: 0.8 mg/dL (ref 0.2–1.2)
Total Protein: 7.9 g/dL (ref 6.0–8.3)

## 2018-08-16 LAB — CBC WITH DIFFERENTIAL/PLATELET
BASOS PCT: 0.8 % (ref 0.0–3.0)
Basophils Absolute: 0 10*3/uL (ref 0.0–0.1)
EOS ABS: 0.2 10*3/uL (ref 0.0–0.7)
Eosinophils Relative: 4 % (ref 0.0–5.0)
HEMATOCRIT: 39 % (ref 36.0–46.0)
Hemoglobin: 13 g/dL (ref 12.0–15.0)
LYMPHS PCT: 28.2 % (ref 12.0–46.0)
Lymphs Abs: 1.3 10*3/uL (ref 0.7–4.0)
MCHC: 33.4 g/dL (ref 30.0–36.0)
MCV: 91.6 fl (ref 78.0–100.0)
Monocytes Absolute: 0.3 10*3/uL (ref 0.1–1.0)
Monocytes Relative: 6.8 % (ref 3.0–12.0)
NEUTROS ABS: 2.8 10*3/uL (ref 1.4–7.7)
Neutrophils Relative %: 60.2 % (ref 43.0–77.0)
PLATELETS: 242 10*3/uL (ref 150.0–400.0)
RBC: 4.26 Mil/uL (ref 3.87–5.11)
RDW: 12.8 % (ref 11.5–15.5)
WBC: 4.6 10*3/uL (ref 4.0–10.5)

## 2018-08-16 LAB — SEDIMENTATION RATE: Sed Rate: 5 mm/hr (ref 0–30)

## 2018-08-16 MED ORDER — LISINOPRIL-HYDROCHLOROTHIAZIDE 10-12.5 MG PO TABS
1.0000 | ORAL_TABLET | Freq: Every day | ORAL | 2 refills | Status: DC
Start: 2018-08-16 — End: 2018-09-06

## 2018-08-16 MED ORDER — METHYLPREDNISOLONE ACETATE 80 MG/ML IJ SUSP
80.0000 mg | Freq: Once | INTRAMUSCULAR | Status: AC
  Administered 2018-08-16: 80 mg via INTRAMUSCULAR

## 2018-08-16 MED ORDER — PREDNISONE 10 MG PO TABS: ORAL_TABLET | ORAL | 0 refills | Status: DC

## 2018-08-16 NOTE — Patient Instructions (Signed)
DASH Eating Plan DASH stands for "Dietary Approaches to Stop Hypertension." The DASH eating plan is a healthy eating plan that has been shown to reduce high blood pressure (hypertension). It may also reduce your risk for type 2 diabetes, heart disease, and stroke. The DASH eating plan may also help with weight loss. What are tips for following this plan? General guidelines  Avoid eating more than 2,300 mg (milligrams) of salt (sodium) a day. If you have hypertension, you may need to reduce your sodium intake to 1,500 mg a day.  Limit alcohol intake to no more than 1 drink a day for nonpregnant women and 2 drinks a day for men. One drink equals 12 oz of beer, 5 oz of wine, or 1 oz of hard liquor.  Work with your health care provider to maintain a healthy body weight or to lose weight. Ask what an ideal weight is for you.  Get at least 30 minutes of exercise that causes your heart to beat faster (aerobic exercise) most days of the week. Activities may include walking, swimming, or biking.  Work with your health care provider or diet and nutrition specialist (dietitian) to adjust your eating plan to your individual calorie needs. Reading food labels  Check food labels for the amount of sodium per serving. Choose foods with less than 5 percent of the Daily Value of sodium. Generally, foods with less than 300 mg of sodium per serving fit into this eating plan.  To find whole grains, look for the word "whole" as the first word in the ingredient list. Shopping  Buy products labeled as "low-sodium" or "no salt added."  Buy fresh foods. Avoid canned foods and premade or frozen meals. Cooking  Avoid adding salt when cooking. Use salt-free seasonings or herbs instead of table salt or sea salt. Check with your health care provider or pharmacist before using salt substitutes.  [place] not fry foods. Cook foods using healthy methods such as baking, boiling, grilling, and broiling instead.  Cook with  heart-healthy oils, such as olive, canola, soybean, or sunflower oil. Meal planning   Eat a balanced diet that includes: ? 5 or more servings of fruits and vegetables each day. At each meal, try to fill half of your plate with fruits and vegetables. ? Up to 6-8 servings of whole grains each day. ? Less than 6 oz of lean meat, poultry, or fish each day. A 3-oz serving of meat is about the same size as a deck of cards. One egg equals 1 oz. ? 2 servings of low-fat dairy each day. ? A serving of nuts, seeds, or beans 5 times each week. ? Heart-healthy fats. Healthy fats called Omega-3 fatty acids are found in foods such as flaxseeds and coldwater fish, like sardines, salmon, and mackerel.  Limit how much you eat of the following: ? Canned or prepackaged foods. ? Food that is high in trans fat, such as fried foods. ? Food that is high in saturated fat, such as fatty meat. ? Sweets, desserts, sugary drinks, and other foods with added sugar. ? Full-fat dairy products.  [place] not salt foods before eating.  Try to eat at least 2 vegetarian meals each week.  Eat more home-cooked food and less restaurant, buffet, and fast food.  When eating at a restaurant, ask that your food be prepared with less salt or no salt, if possible. What foods are recommended? The items listed may not be a complete list. Talk with your dietitian about what   dietary choices are best for you. Grains Whole-grain or whole-wheat bread. Whole-grain or whole-wheat pasta. Brown rice. Oatmeal. Quinoa. Bulgur. Whole-grain and low-sodium cereals. Pita bread. Low-fat, low-sodium crackers. Whole-wheat flour tortillas. Vegetables Fresh or frozen vegetables (raw, steamed, roasted, or grilled). Low-sodium or reduced-sodium tomato and vegetable juice. Low-sodium or reduced-sodium tomato sauce and tomato paste. Low-sodium or reduced-sodium canned vegetables. Fruits All fresh, dried, or frozen fruit. Canned fruit in natural juice (without  added sugar). Meat and other protein foods Skinless chicken or turkey. Ground chicken or turkey. Pork with fat trimmed off. Fish and seafood. Egg whites. Dried beans, peas, or lentils. Unsalted nuts, nut butters, and seeds. Unsalted canned beans. Lean cuts of beef with fat trimmed off. Low-sodium, lean deli meat. Dairy Low-fat (1%) or fat-free (skim) milk. Fat-free, low-fat, or reduced-fat cheeses. Nonfat, low-sodium ricotta or cottage cheese. Low-fat or nonfat yogurt. Low-fat, low-sodium cheese. Fats and oils Soft margarine without trans fats. Vegetable oil. Low-fat, reduced-fat, or light mayonnaise and salad dressings (reduced-sodium). Canola, safflower, olive, soybean, and sunflower oils. Avocado. Seasoning and other foods Herbs. Spices. Seasoning mixes without salt. Unsalted popcorn and pretzels. Fat-free sweets. What foods are not recommended? The items listed may not be a complete list. Talk with your dietitian about what dietary choices are best for you. Grains Baked goods made with fat, such as croissants, muffins, or some breads. Dry pasta or rice meal packs. Vegetables Creamed or fried vegetables. Vegetables in a cheese sauce. Regular canned vegetables (not low-sodium or reduced-sodium). Regular canned tomato sauce and paste (not low-sodium or reduced-sodium). Regular tomato and vegetable juice (not low-sodium or reduced-sodium). Pickles. Olives. Fruits Canned fruit in a light or heavy syrup. Fried fruit. Fruit in cream or butter sauce. Meat and other protein foods Fatty cuts of meat. Ribs. Fried meat. Bacon. Sausage. Bologna and other processed lunch meats. Salami. Fatback. Hotdogs. Bratwurst. Salted nuts and seeds. Canned beans with added salt. Canned or smoked fish. Whole eggs or egg yolks. Chicken or turkey with skin. Dairy Whole or 2% milk, cream, and half-and-half. Whole or full-fat cream cheese. Whole-fat or sweetened yogurt. Full-fat cheese. Nondairy creamers. Whipped toppings.  Processed cheese and cheese spreads. Fats and oils Butter. Stick margarine. Lard. Shortening. Ghee. Bacon fat. Tropical oils, such as coconut, palm kernel, or palm oil. Seasoning and other foods Salted popcorn and pretzels. Onion salt, garlic salt, seasoned salt, table salt, and sea salt. Worcestershire sauce. Tartar sauce. Barbecue sauce. Teriyaki sauce. Soy sauce, including reduced-sodium. Steak sauce. Canned and packaged gravies. Fish sauce. Oyster sauce. Cocktail sauce. Horseradish that you find on the shelf. Ketchup. Mustard. Meat flavorings and tenderizers. Bouillon cubes. Hot sauce and Tabasco sauce. Premade or packaged marinades. Premade or packaged taco seasonings. Relishes. Regular salad dressings. Where to find more information:  National Heart, Lung, and Blood Institute: www.nhlbi.nih.gov  American Heart Association: www.heart.org Summary  The DASH eating plan is a healthy eating plan that has been shown to reduce high blood pressure (hypertension). It may also reduce your risk for type 2 diabetes, heart disease, and stroke.  With the DASH eating plan, you should limit salt (sodium) intake to 2,300 mg a day. If you have hypertension, you may need to reduce your sodium intake to 1,500 mg a day.  When on the DASH eating plan, aim to eat more fresh fruits and vegetables, whole grains, lean proteins, low-fat dairy, and heart-healthy fats.  Work with your health care provider or diet and nutrition specialist (dietitian) to adjust your eating plan to your individual   calorie needs. This information is not intended to replace advice given to you by your health care provider. Make sure you discuss any questions you have with your health care provider. Document Released: 10/09/2011 Document Revised: 10/13/2016 Document Reviewed: 10/13/2016 Elsevier Interactive Patient Education  2018 [employer]  

## 2018-08-16 NOTE — Assessment & Plan Note (Addendum)
con't antihistamines Depo medrol  pred taper  Refer to derm/allergy if no relief

## 2018-08-16 NOTE — Assessment & Plan Note (Signed)
New--- Poorly controlled will alter medications, encouraged DASH diet, minimize caffeine and obtain adequate sleep. Report concerning symptoms and follow up as directed and as needed 

## 2018-08-16 NOTE — Progress Notes (Signed)
Patient ID: Ariel Williams, female    DOB: August 16, 1941  Age: 77 y.o. MRN: [id number]    Subjective:  Subjective  HPI Ariel Williams presents for f/u and hives that started 1 weeks ago on her abd, arms and hips.  She was working in the yard the day prior to it occurring and used a balm on her abd because she was not feeling well and she work up the next day with hives and itching.  She tried hydrocorticone , proctozone and bactroban with no relief and benadryl with no relief.      Review of Systems  Constitutional: Negative for chills and fever.  HENT: Negative for congestion and hearing loss.   Eyes: Negative for discharge.  Respiratory: Negative for cough and shortness of breath.   Cardiovascular: Negative for chest pain, palpitations and leg swelling.  Gastrointestinal: Negative for abdominal pain, blood in stool, constipation, diarrhea, nausea and vomiting.  Genitourinary: Negative for dysuria, frequency, hematuria and urgency.  Musculoskeletal: Negative for back pain and myalgias.  Skin: Positive for rash.  Allergic/Immunologic: Negative for environmental allergies.  Neurological: Negative for dizziness, weakness and headaches.  Hematological: Does not bruise/bleed easily.  Psychiatric/Behavioral: Negative for suicidal ideas. The patient is not nervous/anxious.     History Past Medical History:  Diagnosis Date  . Arthritis   . H. pylori infection     She has a past surgical history that includes Tubal ligation.   Her family history includes Asthma in her brother; Stroke in her brother.She reports that she has never smoked. She has never used smokeless tobacco. She reports that she does not drink alcohol or use drugs.  Current Outpatient Medications on File Prior to Visit  Medication Sig Dispense Refill  . loratadine (CLARITIN) 10 MG tablet Take 1 tablet (10 mg total) by mouth daily. 30 tablet 11   No current facility-administered medications on file prior to visit.      Objective:    Objective  Physical Exam  Constitutional: She is oriented to person, place, and time. She appears well-developed and well-nourished.  HENT:  Head: Normocephalic and atraumatic.  Eyes: Conjunctivae and EOM are normal.  Neck: Normal range of motion. Neck supple. No JVD present. Carotid bruit is not present. No thyromegaly present.  Cardiovascular: Normal rate, regular rhythm and normal heart sounds.  No murmur heard. Pulmonary/Chest: Effort normal and breath sounds normal. No respiratory distress. She has no wheezes. She has no rales. She exhibits no tenderness.  Musculoskeletal: She exhibits no edema.  Neurological: She is alert and oriented to person, place, and time.  Skin: Rash noted. There is erythema.     Psychiatric: She has a normal mood and affect.  Nursing note and vitals reviewed.  BP (!) 158/71   Pulse 70   Temp 98.6 F (37 C) (Oral)   Resp 16   Ht 5' (1.524 m)   Wt 125 lb 3.2 oz (56.8 kg)   SpO2 99%   BMI 24.45 kg/m  Wt Readings from Last 3 Encounters:  08/16/18 125 lb 3.2 oz (56.8 kg)  02/02/18 126 lb 12.8 oz (57.5 kg)  08/11/17 127 lb (57.6 kg)     Lab Results  Component Value Date   WBC 4.6 08/16/2018   HGB 13.0 08/16/2018   HCT 39.0 08/16/2018   PLT 242.0 08/16/2018   GLUCOSE 106 (H) 08/16/2018   CHOL 202 (H) 08/16/2018   TRIG 179.0 (H) 08/16/2018   HDL 46.90 08/16/2018   LDLDIRECT 126.0 08/11/2017  LDLCALC 119 (H) 08/16/2018   ALT 13 08/16/2018   AST 19 08/16/2018   NA 140 08/16/2018   K 4.5 08/16/2018   CL 104 08/16/2018   CREATININE 0.63 08/16/2018   BUN 14 08/16/2018   CO2 31 08/16/2018   TSH 2.36 02/18/2016    Dg Bone Density  Result Date: 06/24/2016 EXAM: DUAL X-RAY ABSORPTIOMETRY (DXA) FOR BONE MINERAL DENSITY IMPRESSION: Referring Physician:  Rosalita Chessman CHASE PATIENT: Name: Ariel, Williams Patient ID: [id number] Birth Date: 1941/03/04 Height: 60.0 in. Sex: Female Measured: 06/24/2016 Weight: 127.4 lbs. Indications: Advanced Age,  Estrogen Deficiency, History of Osteoporosis, Post Menopausal, Vietnamese Fractures: Treatments: Calcium, Vitamin D ASSESSMENT: The BMD measured at AP Spine L1-L2 is 0.910 g/cm2 with a T-score of -2.1. This patient is considered osteopenic according to [facility]) criteria. L-3 & 4 was excluded due to degenerative changes. Site Region Measured Date Measured Age WHO YA BMD Classification T-score AP Spine L1-L2 06/24/2016 75.2 Osteopenia -2.1 0.910 g/cm2 DualFemur Total Mean 06/24/2016 75.2 years Osteopenia -1.3 0.848 g/cm2 World Health Organization [facility]) criteria for post-menopausal, Caucasian Women: Normal       T-score at or above -1 SD Osteopenia   T-score between -1 and -2.5 SD Osteoporosis T-score at or below -2.5 SD RECOMMENDATION: [facility] recommends that FDA-approved medical therapies be considered in postmenopausal women and men age 59 or older with a: 1. Hip or vertebral (clinical or morphometric) fracture. 2. T-score of < -2.5 at the spine or hip. 3. Ten-year fracture probability by FRAX of 3% or greater for hip fracture or 20% or greater for major osteoporotic fracture. All treatment decisions require clinical judgment and consideration of individual patient factors, including patient preferences, co-morbidities, previous drug use, risk factors not captured in the FRAX model (e.g. falls, vitamin D deficiency, increased bone turnover, interval significant decline in bone density) and possible under - or over-estimation of fracture risk by FRAX. All patients should ensure an adequate intake of dietary calcium (1200 mg/d) and vitamin D (800 IU daily) unless contraindicated. FOLLOW-UP: People with diagnosed cases of osteoporosis or at high risk for fracture should have regular bone mineral density tests. For patients eligible for Medicare, routine testing is allowed once every 2 years. The testing frequency can be increased to one year for patients who have rapidly  progressing disease, those who are receiving or discontinuing medical therapy to restore bone mass, or have additional risk factors. I have reviewed this report and agree with the above findings. [facility] Radiology Referring Physician:  Rosalita Chessman CHASE PATIENT: Name: Gwynne, Kemnitz Patient ID: [id number] Birth Date: [birth year] Height: 60.0 in. Sex: Female Measured: 06/24/2016 Weight: 127.4 lbs. Indications: Advanced Age, Estrogen Deficiency, History of Osteoporosis, Post Menopausal, Vietnamese Fractures: Treatments: Calcium, Vitamin D ASSESSMENT: The BMD measured at AP Spine L1-L2 is 0.910 g/cm2 with a T-score of -2.1. This patient is considered osteopenic according to [facility]) criteria. L-3 & 4 was excluded due to degenerative changes. Site Region Measured Date Measured Age WHO YA BMD Classification T-score AP Spine L1-L2 06/24/2016 75.2 Osteopenia -2.1 0.910 g/cm2 DualFemur Total Mean 06/24/2016 75.2 years Osteopenia -1.3 0.848 g/cm2 World Health Organization [facility]) criteria for post-menopausal, Caucasian Women: Normal       T-score at or above -1 SD Osteopenia   T-score between -1 and -2.5 SD Osteoporosis T-score at or below -2.5 SD RECOMMENDATION: [facility] recommends that FDA-approved medical therapies be considered in postmenopausal women and men age 55 or  older with a: 1. Hip or vertebral (clinical or morphometric) fracture. 2. T-score of < -2.5 at the spine or hip. 3. Ten-year fracture probability by FRAX of 3% or greater for hip fracture or 20% or greater for major osteoporotic fracture. All treatment decisions require clinical judgment and consideration of individual patient factors, including patient preferences, co-morbidities, previous drug use, risk factors not captured in the FRAX model (e.g. falls, vitamin D deficiency, increased bone turnover, interval significant decline in bone density) and possible under - or over-estimation of fracture risk by FRAX. All  patients should ensure an adequate intake of dietary calcium (1200 mg/d) and vitamin D (800 IU daily) unless contraindicated. FOLLOW-UP: People with diagnosed cases of osteoporosis or at high risk for fracture should have regular bone mineral density tests. For patients eligible for Medicare, routine testing is allowed once every 2 years. The testing frequency can be increased to one year for patients who have rapidly progressing disease, those who are receiving or discontinuing medical therapy to restore bone mass, or have additional risk factors. I have reviewed this report and agree with the above findings. [facility] Radiology Patient: Rachel Bo Referring Physician: Rosalita Chessman CHASE Birth Date: September 09, 1941 [age]       [age] Patient ID: [id number] Height: 60.0 in. Weight: 127.4 lbs. Measured: 06/24/2016 9:22:51 AM (16 SP 2) Sex: Female Ethnicity: White Analyzed: 06/24/2016 9:24:24 AM (16 SP 2) FRAX* 10-year Probability of Fracture Based on femoral neck BMD: DualFemur (Left) Major Osteoporotic Fracture: - Hip Fracture:                - Population:                  [place] (Caucasian) Risk Factors:                None *FRAX is a Materials engineer of the [employer] for Metabolic Bone Disease, a World Pharmacologist (WHO) [facility]. ASSESSMENT: PLEASE ENTER ASSESSMENT TEXT. Electronically Signed   By: Kerby Moors M.D.   On: 06/24/2016 11:30   Mm Digital Screening Bilateral  Result Date: 06/24/2016 CLINICAL DATA:  Screening. EXAM: DIGITAL SCREENING BILATERAL MAMMOGRAM WITH CAD COMPARISON:  Previous exam(s). ACR Breast Density Category b: There are scattered areas of fibroglandular density. FINDINGS: There are no findings suspicious for malignancy. Images were processed with CAD. IMPRESSION: No mammographic evidence of malignancy. A result letter of this screening mammogram will be mailed directly to the patient. RECOMMENDATION: Screening mammogram in one year.  (Code:SM-B-01Y) BI-RADS CATEGORY  1: Negative. Electronically Signed   By: Dorise Bullion III M.D   On: 06/25/2016 13:42     Assessment & Plan:  Plan  I have discontinued Ariel Williams's diphenhydramine-acetaminophen, doxycycline, fluticasone, promethazine-dextromethorphan, and hydrocortisone. I am also having her start on predniSONE and lisinopril-hydrochlorothiazide. Additionally, I am having her maintain her loratadine. We administered methylPREDNISolone acetate.  Meds ordered this encounter  Medications  . predniSONE (DELTASONE) 10 MG tablet    Sig: TAKE 3 TABLETS PO QD FOR 3 DAYS THEN TAKE 2 TABLETS PO QD FOR 3 DAYS THEN TAKE 1 TABLET PO QD FOR 3 DAYS THEN TAKE 1/2 TAB PO QD FOR 3 DAYS    Dispense:  20 tablet    Refill:  0  . lisinopril-hydrochlorothiazide (PRINZIDE,ZESTORETIC) 10-12.5 MG tablet    Sig: Take 1 tablet by mouth daily.    Dispense:  30 tablet    Refill:  2  . methylPREDNISolone acetate (DEPO-MEDROL)  injection 80 mg    Problem List Items Addressed This Visit      Unprioritized   Essential hypertension - Primary    New Poorly controlled will alter medications, encouraged DASH diet, minimize caffeine and obtain adequate sleep. Report concerning symptoms and follow up as directed and as needed      Relevant Medications   lisinopril-hydrochlorothiazide (PRINZIDE,ZESTORETIC) 10-12.5 MG tablet   Other Relevant Orders   CBC with Differential/Platelet (Completed)   Lipid panel (Completed)   Comprehensive metabolic panel (Completed)   Antinuclear Antib (ANA)   Sedimentation rate (Completed)   Hives    con't antihistamines Depo medrol  pred taper  Refer to derm/allergy if no relief       Relevant Medications   predniSONE (DELTASONE) 10 MG tablet   methylPREDNISolone acetate (DEPO-MEDROL) injection 80 mg (Completed)   Other Relevant Orders   Antinuclear Antib (ANA)   Sedimentation rate (Completed)    Other Visit Diagnoses    Influenza vaccine administered         Relevant Orders   Flu vaccine HIGH DOSE PF (Fluzone High Dose) (Completed)      Follow-up: Return in about 3 weeks (around 09/06/2018), or if symptoms worsen or fail to improve, for hypertension.  [name], [place]

## 2018-08-17 LAB — ANA: ANA: NEGATIVE

## 2018-08-17 MED ORDER — METHYLPREDNISOLONE ACETATE 80 MG/ML IJ SUSP
80.0000 mg | Freq: Once | INTRAMUSCULAR | Status: AC
  Administered 2018-08-16: 80 mg via INTRAMUSCULAR

## 2018-08-17 NOTE — Addendum Note (Signed)
Addended by: Kem Boroughs D on: 08/17/2018 06:51 PM   Modules accepted: Orders

## 2018-08-19 ENCOUNTER — Other Ambulatory Visit: Payer: Self-pay

## 2018-08-19 DIAGNOSIS — E785 Hyperlipidemia, unspecified: Secondary | ICD-10-CM

## 2018-08-19 MED ORDER — ATORVASTATIN CALCIUM 20 MG PO TABS: 20.0000 mg | ORAL_TABLET | Freq: Every day | ORAL | 2 refills | Status: DC

## 2018-08-27 ENCOUNTER — Encounter: Payer: Self-pay | Admitting: *Deleted

## 2018-09-06 ENCOUNTER — Ambulatory Visit (INDEPENDENT_AMBULATORY_CARE_PROVIDER_SITE_OTHER): Payer: Medicare Other | Admitting: Family Medicine

## 2018-09-06 ENCOUNTER — Encounter: Payer: Self-pay | Admitting: Family Medicine

## 2018-09-06 VITALS — BP 152/80 | HR 75 | Temp 97.7°F | Resp 14 | Ht 60.0 in | Wt 122.0 lb

## 2018-09-06 DIAGNOSIS — I10 Essential (primary) hypertension: Secondary | ICD-10-CM | POA: Diagnosis not present

## 2018-09-06 DIAGNOSIS — F419 Anxiety disorder, unspecified: Secondary | ICD-10-CM

## 2018-09-06 DIAGNOSIS — H40033 Anatomical narrow angle, bilateral: Secondary | ICD-10-CM | POA: Diagnosis not present

## 2018-09-06 DIAGNOSIS — E785 Hyperlipidemia, unspecified: Secondary | ICD-10-CM | POA: Diagnosis not present

## 2018-09-06 DIAGNOSIS — R079 Chest pain, unspecified: Secondary | ICD-10-CM | POA: Diagnosis not present

## 2018-09-06 DIAGNOSIS — H2513 Age-related nuclear cataract, bilateral: Secondary | ICD-10-CM | POA: Diagnosis not present

## 2018-09-06 LAB — BASIC METABOLIC PANEL
BUN: 15 mg/dL (ref 6–23)
CO2: 29 mEq/L (ref 19–32)
Calcium: 9.6 mg/dL (ref 8.4–10.5)
Chloride: 102 mEq/L (ref 96–112)
Creatinine, Ser: 0.78 mg/dL (ref 0.40–1.20)
GFR: 76.02 mL/min (ref >60.00)
Glucose, Bld: 100 mg/dL — ABNORMAL HIGH (ref 70–99)
POTASSIUM: 4.1 meq/L (ref 3.5–5.1)
Sodium: 139 mEq/L (ref 135–145)

## 2018-09-06 MED ORDER — LISINOPRIL-HYDROCHLOROTHIAZIDE 20-25 MG PO TABS: 1.0000 | ORAL_TABLET | Freq: Every day | ORAL | 3 refills | Status: DC

## 2018-09-06 MED ORDER — SERTRALINE HCL 25 MG PO TABS: 25.0000 mg | ORAL_TABLET | Freq: Every day | ORAL | 2 refills | Status: DC

## 2018-09-06 NOTE — Progress Notes (Signed)
Pt. vietnamese, speaks some english. Chief Strategy Officer phoned interpreter service, spoke to interpreter [phone]. Pt. States her itching all over body  has improved with the injection she received and the cream prescribed by Dr. Etter Sjogren. Meds and allergies reviewed with pt. Via interpreter. Pt. States she has been having reflux, and wants medication renewed that she had been on at one point for that. Pt. Also wanted Dr. Etter Sjogren to know that whenever she is in the MD office, her heart rate is higher than normal. HR 75 today.

## 2018-09-06 NOTE — Assessment & Plan Note (Signed)
Poorly controlled will alter medications, encouraged DASH diet, minimize caffeine and obtain adequate sleep. Report concerning symptoms and follow up as directed and as needed 

## 2018-09-06 NOTE — Patient Instructions (Addendum)
?au tha?nh ng??c Chest Wall Pain ?au tha?nh ng?c l ?au ?? trong ho?c xung quanh x??ng v c? ng?c c?a quy? vi?. ?i khi, m?t ch?n th??ng gy ra c?n ?au ny. ?i khi, nguyn nhn c th? khng ro?. Ch??ng ?au ny c th? m?t vi tu?n ho?c lu h?n m??i ???. Tun th? nh?ng h??ng d?n ny ? nh: Ch  ??n b?t c? thay ??i no v? tri?u ch?ng c?a qu v?. Th?c hi?n nh?ng hnh ??ng sau ?? gip gi?m ?au:  Ngh? ng?i theo ch? d?n c?a chuyn gia ch?m [place] s?c kh?e.  Trnh ca?c ho?t ??ng gy ?au. Cc ho?t ??ng ? bao g?m b?t k? ho?t ??ng na?o s? d?ng c? ng?c ho?c c? b?ng va? c? s???n cu?a quy? vi? ?? nng v?t n?ng.  N?u ???c ch? d?n, hy ch??m ? l?nh vo vng b? ?au: ? Cho ? l?nh vo ti ni lng. ? ?? kh?n t?m ? gi?a da v ti ch??m. ? Ch??m ? l?nh trong 20 pht, 2-3 l?n m?i ngy.  Ch? s? d?ng thu?c khng k ??n v thu?c k ??n theo ch? d?n c?a chuyn gia ch?m [place] s?c kh?e.  Khng s? d?ng cc s?n ph?m thu?c l, bao g?m thu?c l d?ng ht, thu?c l d?ng nhai v thu?c l ?i?n t?. N?u qu v? c?n gip ?? ?? cai thu?c, hy h?i chuyn gia ch?m [place] s?c kh?e.  Tun th? t?t c? cc l?n khm theo di theo ch? d?n c?a chuyn gia ch?m [place] s?c kh?e. ?i?u ny c vai tr quan tr?ng.  Hy lin l?c v?i chuyn gia ch?m [place] s?c kh?e n?u:  Qu v? b? s?t.  C?n ?au ng?c cu?a quy? vi? tr?m tr?ng h?n.  Qu v? c cc tri?u ch?ng m?i. Yu c?u tr? gip ngay l?p t?c n?u:  Qu v? b? bu?n nn ho?c nn m?a.  Qu v? th?y ?? m? hi ho?c chong vng.  Qu v? ho km ??m (?m) ho?c qu v? ho ra mu.  Qu v? b? kh th?. Thng tin ny khng nh?m m?c ?ch thay th? cho l?i khuyn m chuyn gia ch?m [place] s?c kh?e ni v?i qu v?. Hy b?o ??m qu v? ph?i th?o lu?n b?t k? v?n ?? g m qu v? c v?i chuyn gia ch?m [place] s?c kh?e c?a qu v?. Document Released: 02/11/2016 Document Revised: 02/02/2017 Document Reviewed: 01/15/2015 Elsevier Interactive Patient Education  2018 [place].    Ch??ng trnh ?n u?ng DASH DASH Eating  Plan DASH l vi?t t?t c?a "Dietary Approaches to Stop Hypertension", ngh?a l Ph??ng php ti?p c?n ch? ?? ?n u?ng ?? ng?n ch?n t?ng huy?t p. Ch??ng trnh ?n u?ng DASH l ch??ng trnh ?n u?ng lnh m?nh ? ???c ch?ng minh c tc d?ng lm gi?m huy?t p cao (t?ng huy?t p). N c?ng c th? lm gi?m nguy c? b? ti?u d??ng tup 2, b?nh tim v ??t qu?. Ch??ng trnh ?n u?ng DASH c?ng c th? gip gi?m cn. C nh?ng l?i khuyn no ?? lm theo ch??ng trnh ny? H??ng d?n chung  Trnh ?n trn 2.300 mg (miligam) mu?i (natri) m?i ngy. N?u b? t?ng huy?t p, qu v? c th? c?n gi?m l??ng dng natri xu?ng ??n m?c 1.500 mg m?i ngy.  Gi?i h?n l??ng r??u qu v? u?ng khng qu 1 ly m?i ngy v?i ph? n? khng mang thai v 2 ly m?i ngy v?i nam gi?i. M?t ly t??ng ???ng v?i 12 ao-x? bia, 5 ao-x? r??u vang, ho?c 1 ao-x? r??u m?nh.  H?p tc v?i chuyn gia ch?m [place] s?c kh?e c?a qu v? ?? duy tr tr?ng l??ng c? th? c l?i cho s?c kh?e ho?c ?? gi?m cn. Hy h?i xem tr?ng l??ng no l l t??ng cho qu v?.  Dnh t nh?t 30 pht t?p th? d?c c th? khi?n tim qu v? ??p nhanh h?n (t?p th? d?c nh?p ?i?u) h?u h?t cc ngy trong tu?n. Cc ho?t ??ng c th? bao g?m ?i b?, b?i, ho?c ??p xe.  H?p tc v?i chuyn gia ch?m [place] s?c kh?e ho?c chuyn gia ch? ?? ?n v dinh d??ng (bc s? chuyn khoa dinh d??ng) c?a qu v? ?? ?i?u ch?nh ch??ng trnh ?n u?ng ph h?p v?i nhu c?u calo c?a c nhn qu v?. ??c nhn th?c ph?m  Ki?m tra nhn th?c ph?m ?? xem hm l??ng natri cho m?i kh?u ph?n. Ch?n nh?ng th?c ph?m c d??i 5 ph?n tr?m L??ng natri hng ngy. Ni chung, nh?ng th?c ph?m c d??i 300 mg natri cho m?i kh?u ph?n ph h?p v?i ch??ng trnh ?n u?ng ny.  ?? tm cc lo?i ng? c?c nguyn h?t, hy ki?m t? "nguyn h?t", l t? ??u tin trong danh sch thnh ph?n. Mua s?m  Mua cc s?n ph?m dn nhn "natri th?p" ho?c "khng thm mu?i."  Mua th?c ph?m t??i. Trnh nh?ng th?c ph?m ?ng h?p v cc mn ?n ch? bi?n s?n ho?c ?ng l?nh. N?u n??ng  Trnh thm  mu?i khi n?u ?n. S? d?ng gia v? khng co? mu?i ho?c th?o d??c thay v mu?i ?n ho?c mu?i bi?n. Ki?m tra v?i chuyn gia ch?m [place] s?c kh?e ho??c d???c sy? c?a quy? vi? tr??c khi s? d?ng cc s?n ph?m thay th? mu?i.  Khng chin/rn ?? ?n. N?u ?? ?n b?ng nh?ng ph??ng php c l?i cho s?c kh?e nh? b? l, lu?c, n??ng v hun nng.  N?u ?n b?ng d?u t?t cho tim, ch?ng h?n nh? d?u  liu, c?i d?u, ??u nnh, ho?c h??ng d??ng. Ln k? ho?ch cho b?a ?n   ?n ch? ?? ?n lnh m?nh bao g?m: ? T? 5 kh?u ph?n tri cy v rau tr? ln m?i ngy. Vo m?i b?a ?n, c? g?ng dnh m?t n?a ??a cho tri cy v rau. ? T?i ?a 6-8 kh?u ph?n ng? c?c nguyn h?t m?i ngy. ? D??i 6 ao-x? th?t n?c, th?t gia c?m, ho?c c m?i ngy. M?t kh?u ph?n 3 ao-x? th?t t??ng ???ng kch th??c c?a m?t b? bi. M?t qu? tr?ng t??ng ???ng 1 ao-x?. ? 2 kh?u ph?n s?a t bo m?i ngy. ? M?t kh?u ph?n qu? h?ch, cc lo?i h?t, ho?c ??u 5 l?n m?i tu?n. ? Ch?t bo t?t cho tim. Nh?ng ch?t bo lnh m?nh c tn l axit bo Omega-3, ???c tm th?y trong cc th?c ph?m nh? h?t lanh v c n??c l?nh, nh? c mi, c h?i v c thu.  H?n ch? l??ng ?n nh?ng th?c ph?m sau ?y: ? Th?c ph?m ?ng h?p ho?c ?ng gi s?n. ? Th?c ph?m giu ch?t bo chuy?n ha , ch?ng h?n nh? th?c ph?m chin/rn. ? Th?c ph?m giu ch?t bo bo ha, ch?ng h?n nh? th?t m?. ? ?? ng?t, cc mn trng mi?ng, ?? u?ng c ???ng v nh?ng th?c ph?m khc c b? sung ???ng. ? S?n ph?m t? s?a nguyn ch?t bo.  Khng thm mu?i vo th?c ph?m tr??c khi ?n.  C? g?ng ?n t?i thi?u 2 b?a chay m?i tu?n.  ?n nhi?u th?c ?n n?u t?i nh h?n v i?t th??c ?  n ?? nh hng, th??c ?n t? ch?n v th?c ?n nhanh h?n.  Khi ?n ? nh hng, hy yu c?u th??c ?n c?a qu v? ???c n?u nha?t ho?c khng c mu?i, n?u c th?. Nh?ng lo?i th?c ?n no ???c khuy?n ngh?? Nh?ng m?c ???c li?t k c th? khng ph?i danh sch ??y ??. Hy trao ??i v?i bc s? chuyn khoa dinh d??ng v? cc l?a ch?n ch? ?? dinh d??ng no ph h?p nh?t v?i qu v?. Ng?  c?c Bnh m nguyn h?t ho?c nguyn cm. M ?ng nguyn h?t ho?c nguyn cm. Ga?o l??t. B?t y?n m?ch. H?t dim m?ch (quinoa). T?m la m (bulgur). Ng? c?c nguyn h?t v ng? c?c t natri. Bnh m pita. Bnh quy gin t bo, t natri. Bnh ng nguyn cm. Marlou Starks c? Marlou Starks c? t??i ho?c ?ng l?nh (s?ng, h?p, bo? lo? ho?c n??ng). N???c p c chua v n??c rau p i?t natri ho??c gi?m natri. N??c x?t c chua va? h?n h?p c chua nho i?t natri ho??c gi?m natri. Rau ?o?ng h?p i?t natri ho??c gia?m natri. Tri cy T?t c? tri cy t??i, kh, ho?c ?ng l?nh. Tri cy ?ng h?p d??i d?ng n??c p t? nhin (khng c b? sung ???ng). Th?t v cc th?c ph?m c protein khc Ga? ho??c ga? ty bo? da. G ho?c g ty xay. Th?t heo l?c m?. C v h?i s?n. Lng tr?ng tr?ng. Cc lo?i ??u, ??u H Lan ho??c ??u l?ng kh. Qu? h?ch, b? h?t v cc lo?i h?t khng mu?i. ??u ?ng h?p khng ??p mu?i. Mi?ng th?t b n?c ? l?c m?. Th?t deli n?c, t natri. S?a S?a t bo (1%) ho?c khng bo (tch bo). Ph mt khng bo, t bo, ho?c gi?m bo. Ph mt s?a g?n kem ho?c ph mt ricotta t natri, khng bo. S?a chua t bo ho?c khng bo. Ph mt t bo, t natri. M? v d?u B? th?c v?t m?m khng c ch?t bo chuy?n ha. D?u th?c v?t. N??c x?t mayonnaise v n??c tr?n sa lt t bo, gi?m bo, ho?c lo?i nh? (gi?m natri). D?u h?t c?i d?u, d?u rum, d?u  liu, d?u ??u nnh v d?u h??ng d??ng. Qu? b?. Gia v? v cc th?c ph?m khc. Th?o d??c. Gia v?. H?n h?p gia v? khng c mu?i. B?ng ng va? ba?nh quy khng mu?i. ?? ng?t khng bo. Nh?ng lo?i th?c ?n no khng ???c khuy?n ngh?? Nh?ng m?c ???c li?t k c th? khng ph?i danh sch ??y ??. Hy trao ??i v?i bc s? chuyn khoa dinh d??ng v? cc l?a ch?n ch? ?? dinh d??ng no ph h?p nh?t v?i qu v?. Ng? c?c Cc lo?i bnh n??ng c ch?t bo, ch?ng h?n bnh s?ng b, bnh n??ng x?p, ho?c m?t s? lo?i bnh m. M ?ng kh ho?c cc ti b?t g?o. Marlou Starks c? Marlou Starks c? tr?n kem ho??c xa?o. Cc lo?i rau tr?n n??c x?t pho mt.  Rau ?o?ng h?p thng th??ng (khng ph?i lo?i t natri ho??c gia?m natri). N??c x?t c chua ?ng h?p va? h?n h?p c chua nho thng th??ng (khng ph?i lo?i i?t natri ho??c gi?m natri). N???c p rau v c chua thng th??ng (khng ph?i lo?i i?t natri ho??c gi?m natri). [place]. Tri cy Tri cy ?ng h?p ngm xi-r loa?ng ho??c ???c. Tri cy kh. Tri cy ngm trong kem ho?c n??c x?t b?. Th?t v cc th?c ph?m c protein khc Cc la?t thi?t m??. X??ng s??n. Th?t kh. Th?t l?n mu?i xng khi. Xc  xch. Cc lo?i th?t hun khi v th?t h?p ch? bi?n s?n khc. [place]. M? l?ng. Xc xch nng. Mn xc xch l?n ?? rn. Qu? h?ch v cc lo?i h?t ??p mu?i. ??u ?ng h?p c b? sung mu?i. C ?ng h?p ho?c hun khi. Nguyn qu? tr?ng ho?c lng ?? tr?ng. G ho?c g ty cn da. S?a S??a nguyn kem ho??c 2%, kem v n?a n? n?a kia. Ph mt nguyn kem ho?c ph mt kem nguyn ch?t bo. S??a chua nguyn ch?t bo ho?c s?a chua co? ????ng. Pho mt nguyn ch?t bo. B?t kem khng s?a. L??p phu? kem ?a? ?a?nh bng. Pho mt ch? bi?n s?n v pho mt ph?t. M? v d?u B?. B? th?c v?t d?ng th?i. M? l?n. M? tr?u (shortening). B? s??a tru. M?? thi?t xng kho?i. D?u nhi?t ??i, ch?ng h?n nh? d?u d?a, d?u h?t c?, ho?c d?u c?. Gia v? v cc th?c ph?m khc. B?ng ng v bnh quy m??n. Mu?i hnh, mu?i t?i, mu?i nm, mu?i ?n v mu?i bi?n. N???c x?t [place]. N???c x?t tartar. N??c x?t th?t quay. N???c x?t Teriyaki. N??c t??ng, bao g?m loa?i gi?m natri. N??c x?t th?t n??ng. N???c thi?t ?ng h?p v ?ng gi. N??c m?m. D?u ho. N??c x?t cocktail. Cy c?i ng?a quy? vi? th?y trn k? ha?ng. N??c x?t c chua. M t?t. H??ng li?u th?t v ch?t la?m m?m thi?t. Tho?i b?t canh. N??t x?t nng v n??c x?t [place]. N??c x?t marinat ch? bi?n s?n ho?c ?ng gi. Gia v? taco ch? bi?n s?n ho?c ?ng gi. ?? gia v?. N???c tr?n sa la?t thng th???ng. N?i ?? tm thm thng tin:  Vi?n Tim, Ph?i v Mu Qu?c gia: [email or web address]  Hi?p [place]  K?: www.heart.org Tm t?t  Ch??ng trnh ?n u?ng DASH l ch??ng trnh ?n u?ng lnh m?nh ? ???c ch?ng minh c tc d?ng lm gi?m huy?t p cao (t?ng huy?t p). N c?ng c th? lm gi?m nguy c? b? ti?u d??ng tup 2, b?nh tim v ??t qu?Marland Kitchen  V?i ch??ng trnh ?n u?ng DASH, qu v? c?n ph?i gi?i h?n l??ng mu?i (natri) ? m?c 2.300 mg m?t ngy. N?u b? t?ng huy?t p, qu v? c th? c?n gi?m l??ng dng natri xu?ng ??n m?c 1.500 mg m?i ngy.  Khi ?ang th?c hi?n theo ch??ng trnh ?n u?ng DASH, m?c ?ch l ?n nhi?u tri cy v rau c? t??i, ng? c?c nguyn h?t, protein th?t n?c, s?a t bo v ch?t bo t?t cho tim h?n.  H?p tc v?i chuyn gia ch?m [place] s?c kh?e ho?c chuyn gia ch? ?? ?n v dinh d??ng (bc s? chuyn khoa dinh d??ng) c?a qu v? ?? ?i?u ch?nh ch??ng trnh ?n u?ng ph h?p v?i nhu c?u calo c?a c nhn qu v?. Thng tin ny khng nh?m m?c ?ch thay th? cho l?i khuyn m chuyn gia ch?m [place] s?c kh?e ni v?i qu v?. Hy b?o ??m qu v? ph?i th?o lu?n b?t k? v?n ?? g m qu v? c v?i chuyn gia ch?m [place] s?c kh?e c?a qu v?. Document Released: 02/19/2017 Document Revised: 02/19/2017 Document Reviewed: 02/19/2017 Elsevier Interactive Patient Education  2018 [place] stands for "Dietary Approaches to Stop Hypertension." The DASH eating plan is a healthy eating plan that has been shown to reduce high blood pressure (hypertension). It may also reduce your risk for type 2 diabetes, heart disease, and stroke. The DASH eating plan may also help with weight loss. What are tips  for following this plan? General guidelines  Avoid eating more than 2,300 mg (milligrams) of salt (sodium) a day. If you have hypertension, you may need to reduce your sodium intake to 1,500 mg a day.  Limit alcohol intake to no more than 1 drink a day for nonpregnant women and 2 drinks a day for men. One drink equals 12 oz of beer, 5 oz of wine, or 1 oz of hard liquor.  Work with your health care provider to  maintain a healthy body weight or to lose weight. Ask what an ideal weight is for you.  Get at least 30 minutes of exercise that causes your heart to beat faster (aerobic exercise) most days of the week. Activities may include walking, swimming, or biking.  Work with your health care provider or diet and nutrition specialist (dietitian) to adjust your eating plan to your individual calorie needs. Reading food labels  Check food labels for the amount of sodium per serving. Choose foods with less than 5 percent of the Daily Value of sodium. Generally, foods with less than 300 mg of sodium per serving fit into this eating plan.  To find whole grains, look for the word "whole" as the first word in the ingredient list. Shopping  Buy products labeled as "low-sodium" or "no salt added."  Buy fresh foods. Avoid canned foods and premade or frozen meals. Cooking  Avoid adding salt when cooking. Use salt-free seasonings or herbs instead of table salt or sea salt. Check with your health care provider or pharmacist before using salt substitutes.  Addair not fry foods. Cook foods using healthy methods such as baking, boiling, grilling, and broiling instead.  Cook with heart-healthy oils, such as olive, canola, soybean, or sunflower oil. Meal planning   Eat a balanced diet that includes: ? 5 or more servings of fruits and vegetables each day. At each meal, try to fill half of your plate with fruits and vegetables. ? Up to 6-8 servings of whole grains each day. ? Less than 6 oz of lean meat, poultry, or fish each day. A 3-oz serving of meat is about the same size as a deck of cards. One egg equals 1 oz. ? 2 servings of low-fat dairy each day. ? A serving of nuts, seeds, or beans 5 times each week. ? Heart-healthy fats. Healthy fats called Omega-3 fatty acids are found in foods such as flaxseeds and coldwater fish, like sardines, salmon, and mackerel.  Limit how much you eat of the following: ? Canned  or prepackaged foods. ? Food that is high in trans fat, such as fried foods. ? Food that is high in saturated fat, such as fatty meat. ? Sweets, desserts, sugary drinks, and other foods with added sugar. ? Full-fat dairy products.  Ouk not salt foods before eating.  Try to eat at least 2 vegetarian meals each week.  Eat more home-cooked food and less restaurant, buffet, and fast food.  When eating at a restaurant, ask that your food be prepared with less salt or no salt, if possible. What foods are recommended? The items listed may not be a complete list. Talk with your dietitian about what dietary choices are best for you. Grains Whole-grain or whole-wheat bread. Whole-grain or whole-wheat pasta. Brown rice. Modena Morrow. Bulgur. Whole-grain and low-sodium cereals. Pita bread. Low-fat, low-sodium crackers. Whole-wheat flour tortillas. Vegetables Fresh or frozen vegetables (raw, steamed, roasted, or grilled). Low-sodium or reduced-sodium tomato and vegetable juice. Low-sodium or reduced-sodium tomato sauce and tomato  paste. Low-sodium or reduced-sodium canned vegetables. Fruits All fresh, dried, or frozen fruit. Canned fruit in natural juice (without added sugar). Meat and other protein foods Skinless chicken or [place]. Ground chicken or [place]. Pork with fat trimmed off. Fish and seafood. Egg whites. Dried beans, peas, or lentils. Unsalted nuts, nut butters, and seeds. Unsalted canned beans. Lean cuts of beef with fat trimmed off. Low-sodium, lean deli meat. Dairy Low-fat (1%) or fat-free (skim) milk. Fat-free, low-fat, or reduced-fat cheeses. Nonfat, low-sodium ricotta or cottage cheese. Low-fat or nonfat yogurt. Low-fat, low-sodium cheese. Fats and oils Soft margarine without trans fats. Vegetable oil. Low-fat, reduced-fat, or light mayonnaise and salad dressings (reduced-sodium). Canola, safflower, olive, soybean, and sunflower oils. Avocado. Seasoning and other foods Herbs. Spices.  Seasoning mixes without salt. Unsalted popcorn and pretzels. Fat-free sweets. What foods are not recommended? The items listed may not be a complete list. Talk with your dietitian about what dietary choices are best for you. Grains Baked goods made with fat, such as croissants, muffins, or some breads. Dry pasta or rice meal packs. Vegetables Creamed or fried vegetables. Vegetables in a cheese sauce. Regular canned vegetables (not low-sodium or reduced-sodium). Regular canned tomato sauce and paste (not low-sodium or reduced-sodium). Regular tomato and vegetable juice (not low-sodium or reduced-sodium). Angie Fava. Olives. Fruits Canned fruit in a light or heavy syrup. Fried fruit. Fruit in cream or butter sauce. Meat and other protein foods Fatty cuts of meat. Ribs. Fried meat. Berniece Salines. Sausage. Bologna and other processed lunch meats. Salami. Fatback. Hotdogs. Bratwurst. Salted nuts and seeds. Canned beans with added salt. Canned or smoked fish. Whole eggs or egg yolks. Chicken or [place] with skin. Dairy Whole or 2% milk, cream, and half-and-half. Whole or full-fat cream cheese. Whole-fat or sweetened yogurt. Full-fat cheese. Nondairy creamers. Whipped toppings. Processed cheese and cheese spreads. Fats and oils Butter. Stick margarine. Lard. Shortening. Ghee. Bacon fat. Tropical oils, such as coconut, palm kernel, or palm oil. Seasoning and other foods Salted popcorn and pretzels. Onion salt, garlic salt, seasoned salt, table salt, and sea salt. Worcestershire sauce. Tartar sauce. Barbecue sauce. Teriyaki sauce. Soy sauce, including reduced-sodium. Steak sauce. Canned and packaged gravies. Fish sauce. Oyster sauce. Cocktail sauce. Horseradish that you find on the shelf. Ketchup. Mustard. Meat flavorings and tenderizers. Bouillon cubes. Hot sauce and Tabasco sauce. Premade or packaged marinades. Premade or packaged taco seasonings. Relishes. Regular salad dressings. Where to find more  information:  National Heart, Lung, and [place]: https://wilson-eaton.com/  American Heart Association: www.heart.org Summary  The DASH eating plan is a healthy eating plan that has been shown to reduce high blood pressure (hypertension). It may also reduce your risk for type 2 diabetes, heart disease, and stroke.  With the DASH eating plan, you should limit salt (sodium) intake to 2,300 mg a day. If you have hypertension, you may need to reduce your sodium intake to 1,500 mg a day.  When on the DASH eating plan, aim to eat more fresh fruits and vegetables, whole grains, lean proteins, low-fat dairy, and heart-healthy fats.  Work with your health care provider or diet and nutrition specialist (dietitian) to adjust your eating plan to your individual calorie needs. This information is not intended to replace advice given to you by your health care provider. Make sure you discuss any questions you have with your health care provider. Document Released: 10/09/2011 Document Revised: 10/13/2016 Document Reviewed: 10/13/2016 Elsevier Interactive Patient Education  [employer].

## 2018-09-06 NOTE — Assessment & Plan Note (Signed)
Tolerating statin, encouraged heart healthy diet, avoid trans fats, minimize simple carbs and saturated fats. Increase exercise as tolerated Repeat labs in 3 months

## 2018-09-06 NOTE — Progress Notes (Signed)
Patient ID: Ariel Williams, female    DOB: 06/01/1941  Age: 77 y.o. MRN: [id number]    Subjective:  Subjective  HPI Karsyn T Pedley presents for f/u bp   She c/o of some occasional dizziness but started vitamins and feels better.  She also c/o chest heaviness with anxiety/ stress.  No sob or palpitations.  Not associated with food.  Only occurs with Dr appt and stressful situations.  Her rash has improved--- see last ov    Review of Systems  Constitutional: Negative for appetite change, diaphoresis, fatigue and unexpected weight change.  Eyes: Negative for pain, redness and visual disturbance.  Respiratory: Negative for cough, chest tightness, shortness of breath and wheezing.   Cardiovascular: Positive for chest pain. Negative for palpitations and leg swelling.  Gastrointestinal: Negative for abdominal distention and abdominal pain.  Endocrine: Negative for cold intolerance, heat intolerance, polydipsia, polyphagia and polyuria.  Genitourinary: Negative for difficulty urinating, dysuria and frequency.  Neurological: Negative for dizziness, light-headedness, numbness and headaches.    History Past Medical History:  Diagnosis Date  . Arthritis   . H. pylori infection     She has a past surgical history that includes Tubal ligation.   Her family history includes Asthma in her brother; Stroke in her brother.She reports that she has never smoked. She has never used smokeless tobacco. She reports that she does not drink alcohol or use drugs.  Current Outpatient Medications on File Prior to Visit  Medication Sig Dispense Refill  . atorvastatin (LIPITOR) 20 MG tablet Take 1 tablet (20 mg total) by mouth daily. 30 tablet 2  . loratadine (CLARITIN) 10 MG tablet Take 1 tablet (10 mg total) by mouth daily. (Patient not taking: Reported on 09/06/2018) 30 tablet 11   No current facility-administered medications on file prior to visit.      Objective:  Objective  Physical Exam  Constitutional: She is  oriented to person, place, and time. She appears well-developed and well-nourished. No distress.  HENT:  Head: Normocephalic and atraumatic.  Eyes: Pupils are equal, round, and reactive to light. Conjunctivae and EOM are normal.  Neck: Normal range of motion. Neck supple. No JVD present. Carotid bruit is not present. No thyromegaly present.  Cardiovascular: Normal rate, regular rhythm and normal heart sounds.  No murmur heard. Pulmonary/Chest: Effort normal and breath sounds normal. No respiratory distress. She has no wheezes. She has no rales. She exhibits no tenderness.  Musculoskeletal: She exhibits no edema.  Neurological: She is alert and oriented to person, place, and time.  Psychiatric: She has a normal mood and affect. Her behavior is normal. Judgment and thought content normal.  Nursing note and vitals reviewed.  BP (!) 152/80 (BP Location: Right Arm, Patient Position: Sitting, Cuff Size: Normal)   Pulse 75   Temp 97.7 F (36.5 C) (Oral)   Resp 14   Ht 5' (1.524 m)   Wt 122 lb (55.3 kg)   SpO2 98%   BMI 23.83 kg/m  Wt Readings from Last 3 Encounters:  09/06/18 122 lb (55.3 kg)  08/16/18 125 lb 3.2 oz (56.8 kg)  02/02/18 126 lb 12.8 oz (57.5 kg)     Lab Results  Component Value Date   WBC 4.6 08/16/2018   HGB 13.0 08/16/2018   HCT 39.0 08/16/2018   PLT 242.0 08/16/2018   GLUCOSE 106 (H) 08/16/2018   CHOL 202 (H) 08/16/2018   TRIG 179.0 (H) 08/16/2018   HDL 46.90 08/16/2018   LDLDIRECT 126.0 08/11/2017  LDLCALC 119 (H) 08/16/2018   ALT 13 08/16/2018   AST 19 08/16/2018   NA 140 08/16/2018   K 4.5 08/16/2018   CL 104 08/16/2018   CREATININE 0.63 08/16/2018   BUN 14 08/16/2018   CO2 31 08/16/2018   TSH 2.36 02/18/2016    Dg Bone Density  Result Date: 06/24/2016 EXAM: DUAL X-RAY ABSORPTIOMETRY (DXA) FOR BONE MINERAL DENSITY IMPRESSION: Referring Physician:  Rosalita Chessman CHASE PATIENT: Name: Marti, Mclane Patient ID: [id number] Birth Date: [birth year] Height:  60.0 in. Sex: Female Measured: 06/24/2016 Weight: 127.4 lbs. Indications: Advanced Age, Estrogen Deficiency, History of Osteoporosis, Post Menopausal, Vietnamese Fractures: Treatments: Calcium, Vitamin D ASSESSMENT: The BMD measured at AP Spine L1-L2 is 0.910 g/cm2 with a T-score of -2.1. This patient is considered osteopenic according to [facility]) criteria. L-3 & 4 was excluded due to degenerative changes. Site Region Measured Date Measured Age WHO YA BMD Classification T-score AP Spine L1-L2 06/24/2016 75.2 Osteopenia -2.1 0.910 g/cm2 DualFemur Total Mean 06/24/2016 75.2 years Osteopenia -1.3 0.848 g/cm2 World Health Organization [facility]) criteria for post-menopausal, Caucasian Women: Normal       T-score at or above -1 SD Osteopenia   T-score between -1 and -2.5 SD Osteoporosis T-score at or below -2.5 SD RECOMMENDATION: [place] recommends that FDA-approved medical therapies be considered in postmenopausal women and men age 42 or older with a: 1. Hip or vertebral (clinical or morphometric) fracture. 2. T-score of < -2.5 at the spine or hip. 3. Ten-year fracture probability by FRAX of 3% or greater for hip fracture or 20% or greater for major osteoporotic fracture. All treatment decisions require clinical judgment and consideration of individual patient factors, including patient preferences, co-morbidities, previous drug use, risk factors not captured in the FRAX model (e.g. falls, vitamin D deficiency, increased bone turnover, interval significant decline in bone density) and possible under - or over-estimation of fracture risk by FRAX. All patients should ensure an adequate intake of dietary calcium (1200 mg/d) and vitamin D (800 IU daily) unless contraindicated. FOLLOW-UP: People with diagnosed cases of osteoporosis or at high risk for fracture should have regular bone mineral density tests. For patients eligible for Medicare, routine testing is allowed once every 2  years. The testing frequency can be increased to one year for patients who have rapidly progressing disease, those who are receiving or discontinuing medical therapy to restore bone mass, or have additional risk factors. I have reviewed this report and agree with the above findings. [facility] Referring Physician:  Rosalita Chessman CHASE PATIENT: Name: Jyrah, Blye Patient ID: [id number] Birth Date: 1940/12/02 Height: 60.0 in. Sex: Female Measured: 06/24/2016 Weight: 127.4 lbs. Indications: Advanced Age, Estrogen Deficiency, History of Osteoporosis, Post Menopausal, Vietnamese Fractures: Treatments: Calcium, Vitamin D ASSESSMENT: The BMD measured at AP Spine L1-L2 is 0.910 g/cm2 with a T-score of -2.1. This patient is considered osteopenic according to [facility]) criteria. L-3 & 4 was excluded due to degenerative changes. Site Region Measured Date Measured Age WHO YA BMD Classification T-score AP Spine L1-L2 06/24/2016 75.2 Osteopenia -2.1 0.910 g/cm2 DualFemur Total Mean 06/24/2016 75.2 years Osteopenia -1.3 0.848 g/cm2 World Health Organization [facility]) criteria for post-menopausal, Caucasian Women: Normal       T-score at or above -1 SD Osteopenia   T-score between -1 and -2.5 SD Osteoporosis T-score at or below -2.5 SD RECOMMENDATION: [employer] recommends that FDA-approved medical therapies be considered in postmenopausal women and men age 7 or  older with a: 1. Hip or vertebral (clinical or morphometric) fracture. 2. T-score of < -2.5 at the spine or hip. 3. Ten-year fracture probability by FRAX of 3% or greater for hip fracture or 20% or greater for major osteoporotic fracture. All treatment decisions require clinical judgment and consideration of individual patient factors, including patient preferences, co-morbidities, previous drug use, risk factors not captured in the FRAX model (e.g. falls, vitamin D deficiency, increased bone turnover, interval significant  decline in bone density) and possible under - or over-estimation of fracture risk by FRAX. All patients should ensure an adequate intake of dietary calcium (1200 mg/d) and vitamin D (800 IU daily) unless contraindicated. FOLLOW-UP: People with diagnosed cases of osteoporosis or at high risk for fracture should have regular bone mineral density tests. For patients eligible for Medicare, routine testing is allowed once every 2 years. The testing frequency can be increased to one year for patients who have rapidly progressing disease, those who are receiving or discontinuing medical therapy to restore bone mass, or have additional risk factors. I have reviewed this report and agree with the above findings. [facility] Patient: Rachel Bo Referring Physician: Rosalita Chessman CHASE Birth Date: [birth year] [age] Patient ID: [id number] Height: 60.0 in. Weight: 127.4 lbs. Measured: 06/24/2016 9:22:51 AM (16 SP 2) Sex: Female Ethnicity: White Analyzed: 06/24/2016 9:24:24 AM (16 SP 2) FRAX* 10-year Probability of Fracture Based on femoral neck BMD: DualFemur (Left) Major Osteoporotic Fracture: - Hip Fracture:                - Population:                  [place] (Caucasian) Risk Factors:                None *FRAX is a Materials engineer of the [employer] for Metabolic Bone Disease, a World Pharmacologist (WHO) [facility]. ASSESSMENT: PLEASE ENTER ASSESSMENT TEXT. Electronically Signed   By: Kerby Moors M.D.   On: 06/24/2016 11:30   Mm Digital Screening Bilateral  Result Date: 06/24/2016 CLINICAL DATA:  Screening. EXAM: DIGITAL SCREENING BILATERAL MAMMOGRAM WITH CAD COMPARISON:  Previous exam(s). ACR Breast Density Category b: There are scattered areas of fibroglandular density. FINDINGS: There are no findings suspicious for malignancy. Images were processed with CAD. IMPRESSION: No mammographic evidence of malignancy. A result letter of this screening mammogram  will be mailed directly to the patient. RECOMMENDATION: Screening mammogram in one year. (Code:SM-B-01Y) BI-RADS CATEGORY  1: Negative. Electronically Signed   By: Dorise Bullion III M.D   On: 06/25/2016 13:42     Assessment & Plan:  Plan  I have discontinued Charis T. Thorson's predniSONE and lisinopril-hydrochlorothiazide. I am also having her start on lisinopril-hydrochlorothiazide and sertraline. Additionally, I am having her maintain her loratadine and atorvastatin.  Meds ordered this encounter  Medications  . lisinopril-hydrochlorothiazide (PRINZIDE,ZESTORETIC) 20-25 MG tablet    Sig: Take 1 tablet by mouth daily.    Dispense:  30 tablet    Refill:  3  . sertraline (ZOLOFT) 25 MG tablet    Sig: Take 1 tablet (25 mg total) by mouth daily.    Dispense:  30 tablet    Refill:  2    Problem List Items Addressed This Visit      Unprioritized   Essential hypertension - Primary    Poorly controlled will alter medications, encouraged DASH diet, minimize caffeine and obtain  adequate sleep. Report concerning symptoms and follow up as directed and as needed      Relevant Medications   lisinopril-hydrochlorothiazide (PRINZIDE,ZESTORETIC) 20-25 MG tablet   Other Relevant Orders   Basic metabolic panel   Hyperlipidemia LDL goal <100    Tolerating statin, encouraged heart healthy diet, avoid trans fats, minimize simple carbs and saturated fats. Increase exercise as tolerated Repeat labs in 3 months      Relevant Medications   lisinopril-hydrochlorothiazide (PRINZIDE,ZESTORETIC) 20-25 MG tablet   Chest pain, unspecified   Relevant Orders   EKG 12-Lead (Completed)   Ambulatory referral to Cardiology    Other Visit Diagnoses    Anxiety       Relevant Medications   sertraline (ZOLOFT) 25 MG tablet      Follow-up: Return in about 3 weeks (around 09/27/2018), or if symptoms worsen or fail to improve, for bp check .  [name], [place]

## 2018-09-27 ENCOUNTER — Ambulatory Visit (INDEPENDENT_AMBULATORY_CARE_PROVIDER_SITE_OTHER): Payer: Medicare Other | Admitting: Family Medicine

## 2018-09-27 ENCOUNTER — Encounter: Payer: Self-pay | Admitting: Family Medicine

## 2018-09-27 VITALS — BP 147/64 | HR 72 | Temp 97.9°F | Resp 16 | Ht 60.0 in | Wt 124.2 lb

## 2018-09-27 DIAGNOSIS — R21 Rash and other nonspecific skin eruption: Secondary | ICD-10-CM

## 2018-09-27 DIAGNOSIS — E785 Hyperlipidemia, unspecified: Secondary | ICD-10-CM | POA: Diagnosis not present

## 2018-09-27 DIAGNOSIS — I10 Essential (primary) hypertension: Secondary | ICD-10-CM

## 2018-09-27 MED ORDER — TRIAMCINOLONE ACETONIDE 0.1 % EX CREA: 1.0000 "application " | TOPICAL_CREAM | Freq: Two times a day (BID) | CUTANEOUS | 2 refills | Status: DC

## 2018-09-27 NOTE — Progress Notes (Signed)
Patient ID: Ariel Williams, female    DOB: 06-Jun-1941  Age: 77 y.o. MRN: [id number]    Subjective:  Subjective  HPI [name] presents for bp and chol  --- translater through monitor  No complaints.  -- pt misunderstood cma after last labs and stopped her statin.   We explained that her labs were good because of the medication and she should con't it.    Review of Systems  Constitutional: Negative for appetite change, diaphoresis, fatigue and unexpected weight change.  Eyes: Negative for pain, redness and visual disturbance.  Respiratory: Negative for cough, chest tightness, shortness of breath and wheezing.   Cardiovascular: Negative for chest pain, palpitations and leg swelling.  Endocrine: Negative for cold intolerance, heat intolerance, polydipsia, polyphagia and polyuria.  Genitourinary: Negative for difficulty urinating, dysuria and frequency.  Neurological: Negative for dizziness, light-headedness, numbness and headaches.    History Past Medical History:  Diagnosis Date  . Arthritis   . H. pylori infection     She has a past surgical history that includes Tubal ligation.   Her family history includes Asthma in her brother; Stroke in her brother.She reports that she has never smoked. She has never used smokeless tobacco. She reports that she does not drink alcohol or use drugs.  Current Outpatient Medications on File Prior to Visit  Medication Sig Dispense Refill  . lisinopril-hydrochlorothiazide (PRINZIDE,ZESTORETIC) 20-25 MG tablet Take 1 tablet by mouth daily. 30 tablet 3  . sertraline (ZOLOFT) 25 MG tablet Take 1 tablet (25 mg total) by mouth daily. 30 tablet 2  . atorvastatin (LIPITOR) 20 MG tablet Take 1 tablet (20 mg total) by mouth daily. (Patient not taking: Reported on 09/27/2018) 30 tablet 2   No current facility-administered medications on file prior to visit.      Objective:  Objective  Physical Exam  Constitutional: She is oriented to person, place, and time.  She appears well-developed and well-nourished.  HENT:  Head: Normocephalic and atraumatic.  Eyes: Conjunctivae and EOM are normal.  Neck: Normal range of motion. Neck supple. No JVD present. Carotid bruit is not present. No thyromegaly present.  Cardiovascular: Normal rate, regular rhythm and normal heart sounds.  No murmur heard. Pulmonary/Chest: Effort normal and breath sounds normal. No respiratory distress. She has no wheezes. She has no rales. She exhibits no tenderness.  Musculoskeletal: She exhibits no edema.  Neurological: She is alert and oriented to person, place, and time.  Psychiatric: She has a normal mood and affect.  Nursing note and vitals reviewed.  BP (!) 147/64   Pulse 72   Temp 97.9 F (36.6 C) (Oral)   Resp 16   Ht 5' (1.524 m)   Wt 124 lb 3.2 oz (56.3 kg)   SpO2 100%   BMI 24.26 kg/m  Wt Readings from Last 3 Encounters:  09/27/18 124 lb 3.2 oz (56.3 kg)  09/06/18 122 lb (55.3 kg)  08/16/18 125 lb 3.2 oz (56.8 kg)     Lab Results  Component Value Date   WBC 4.6 08/16/2018   HGB 13.0 08/16/2018   HCT 39.0 08/16/2018   PLT 242.0 08/16/2018   GLUCOSE 100 (H) 09/06/2018   CHOL 202 (H) 08/16/2018   TRIG 179.0 (H) 08/16/2018   HDL 46.90 08/16/2018   LDLDIRECT 126.0 08/11/2017   LDLCALC 119 (H) 08/16/2018   ALT 13 08/16/2018   AST 19 08/16/2018   NA 139 09/06/2018   K 4.1 09/06/2018   CL 102 09/06/2018   CREATININE  0.78 09/06/2018   BUN 15 09/06/2018   CO2 29 09/06/2018   TSH 2.36 02/18/2016    Dg Bone Density  Result Date: 06/24/2016 EXAM: DUAL X-RAY ABSORPTIOMETRY (DXA) FOR BONE MINERAL DENSITY IMPRESSION: Referring Physician:  Rosalita Chessman CHASE PATIENT: Name: Ariel, Williams Patient ID: [id number] Birth Date: 22-Apr-1941 Height: 60.0 in. Sex: Female Measured: 06/24/2016 Weight: 127.4 lbs. Indications: Advanced Age, Estrogen Deficiency, History of Osteoporosis, Post Menopausal, Vietnamese Fractures: Treatments: Calcium, Vitamin D ASSESSMENT: The BMD  measured at AP Spine L1-L2 is 0.910 g/cm2 with a T-score of -2.1. This patient is considered osteopenic according to [facility]) criteria. L-3 & 4 was excluded due to degenerative changes. Site Region Measured Date Measured Age WHO YA BMD Classification T-score AP Spine L1-L2 06/24/2016 75.2 Osteopenia -2.1 0.910 g/cm2 DualFemur Total Mean 06/24/2016 75.2 years Osteopenia -1.3 0.848 g/cm2 World Health Organization [facility]) criteria for post-menopausal, Caucasian Women: Normal       T-score at or above -1 SD Osteopenia   T-score between -1 and -2.5 SD Osteoporosis T-score at or below -2.5 SD RECOMMENDATION: [place] recommends that FDA-approved medical therapies be considered in postmenopausal women and men age 98 or older with a: 1. Hip or vertebral (clinical or morphometric) fracture. 2. T-score of < -2.5 at the spine or hip. 3. Ten-year fracture probability by FRAX of 3% or greater for hip fracture or 20% or greater for major osteoporotic fracture. All treatment decisions require clinical judgment and consideration of individual patient factors, including patient preferences, co-morbidities, previous drug use, risk factors not captured in the FRAX model (e.g. falls, vitamin D deficiency, increased bone turnover, interval significant decline in bone density) and possible under - or over-estimation of fracture risk by FRAX. All patients should ensure an adequate intake of dietary calcium (1200 mg/d) and vitamin D (800 IU daily) unless contraindicated. FOLLOW-UP: People with diagnosed cases of osteoporosis or at high risk for fracture should have regular bone mineral density tests. For patients eligible for Medicare, routine testing is allowed once every 2 years. The testing frequency can be increased to one year for patients who have rapidly progressing disease, those who are receiving or discontinuing medical therapy to restore bone mass, or have additional risk factors. I  have reviewed this report and agree with the above findings. [facility]iology Referring Physician:  Rosalita Chessman CHASE PATIENT: Name: Kynleigh, Artz Patient ID: [id number] Birth Date: [birth year] Height: 60.0 in. Sex: Female Measured: 06/24/2016 Weight: 127.4 lbs. Indications: Advanced Age, Estrogen Deficiency, History of Osteoporosis, Post Menopausal, Vietnamese Fractures: Treatments: Calcium, Vitamin D ASSESSMENT: The BMD measured at AP Spine L1-L2 is 0.910 g/cm2 with a T-score of -2.1. This patient is considered osteopenic according to [facility]) criteria. L-3 & 4 was excluded due to degenerative changes. Site Region Measured Date Measured Age WHO YA BMD Classification T-score AP Spine L1-L2 06/24/2016 75.2 Osteopenia -2.1 0.910 g/cm2 DualFemur Total Mean 06/24/2016 75.2 years Osteopenia -1.3 0.848 g/cm2 World Health Organization [facility]) criteria for post-menopausal, Caucasian Women: Normal       T-score at or above -1 SD Osteopenia   T-score between -1 and -2.5 SD Osteoporosis T-score at or below -2.5 SD RECOMMENDATION: [place] recommends that FDA-approved medical therapies be considered in postmenopausal women and men age 60 or older with a: 1. Hip or vertebral (clinical or morphometric) fracture. 2. T-score of < -2.5 at the spine or hip. 3. Ten-year fracture probability by FRAX of 3% or greater for  hip fracture or 20% or greater for major osteoporotic fracture. All treatment decisions require clinical judgment and consideration of individual patient factors, including patient preferences, co-morbidities, previous drug use, risk factors not captured in the FRAX model (e.g. falls, vitamin D deficiency, increased bone turnover, interval significant decline in bone density) and possible under - or over-estimation of fracture risk by FRAX. All patients should ensure an adequate intake of dietary calcium (1200 mg/d) and vitamin D (800 IU daily) unless contraindicated.  FOLLOW-UP: People with diagnosed cases of osteoporosis or at high risk for fracture should have regular bone mineral density tests. For patients eligible for Medicare, routine testing is allowed once every 2 years. The testing frequency can be increased to one year for patients who have rapidly progressing disease, those who are receiving or discontinuing medical therapy to restore bone mass, or have additional risk factors. I have reviewed this report and agree with the above findings. [facility] Radiology Patient: Rachel Bo Referring Physician: Rosalita Chessman CHASE Birth Date: 02-Aug-1941 [age]       [age] Patient ID: [id number] Height: 60.0 in. Weight: 127.4 lbs. Measured: 06/24/2016 9:22:51 AM (16 SP 2) Sex: Female Ethnicity: White Analyzed: 06/24/2016 9:24:24 AM (16 SP 2) FRAX* 10-year Probability of Fracture Based on femoral neck BMD: DualFemur (Left) Major Osteoporotic Fracture: - Hip Fracture:                - Population:                  [place] (Caucasian) Risk Factors:                None *FRAX is a Materials engineer of the [employer] for Metabolic Bone Disease, a World Pharmacologist (WHO) [facility]. ASSESSMENT: PLEASE ENTER ASSESSMENT TEXT. Electronically Signed   By: Kerby Moors M.D.   On: 06/24/2016 11:30   Mm Digital Screening Bilateral  Result Date: 06/24/2016 CLINICAL DATA:  Screening. EXAM: DIGITAL SCREENING BILATERAL MAMMOGRAM WITH CAD COMPARISON:  Previous exam(s). ACR Breast Density Category b: There are scattered areas of fibroglandular density. FINDINGS: There are no findings suspicious for malignancy. Images were processed with CAD. IMPRESSION: No mammographic evidence of malignancy. A result letter of this screening mammogram will be mailed directly to the patient. RECOMMENDATION: Screening mammogram in one year. (Code:SM-B-01Y) BI-RADS CATEGORY  1: Negative. Electronically Signed   By: Dorise Bullion III M.D   On: 06/25/2016 13:42       Assessment & Plan:  Plan  I have discontinued Nyeli T. Savitt's loratadine. I am also having her maintain her atorvastatin, lisinopril-hydrochlorothiazide, sertraline, and triamcinolone cream.  Meds ordered this encounter  Medications  . triamcinolone cream (KENALOG) 0.1 %    Sig: Apply 1 application topically 2 (two) times daily.    Dispense:  454 g    Refill:  2    Problem List Items Addressed This Visit      Unprioritized   Essential hypertension - Primary    Well controlled, no changes to meds. Encouraged heart healthy diet such as the DASH diet and exercise as tolerated.       Hyperlipidemia LDL goal <100    Check labs today Encouraged heart healthy diet, increase exercise, avoid trans fats, consider a krill oil cap daily      Rash    Eczema-- refill triamcinolone       Relevant Medications   triamcinolone cream (KENALOG) 0.1 %      Follow-up:  Return in about 3 months (around 12/28/2018), or if symptoms worsen or fail to improve, for hypertension, hyperlipidemia.  [name], [place]

## 2018-09-27 NOTE — Patient Instructions (Signed)
DASH Eating Plan DASH stands for "Dietary Approaches to Stop Hypertension." The DASH eating plan is a healthy eating plan that has been shown to reduce high blood pressure (hypertension). It may also reduce your risk for type 2 diabetes, heart disease, and stroke. The DASH eating plan may also help with weight loss. What are tips for following this plan? General guidelines  Avoid eating more than 2,300 mg (milligrams) of salt (sodium) a day. If you have hypertension, you may need to reduce your sodium intake to 1,500 mg a day.  Limit alcohol intake to no more than 1 drink a day for nonpregnant women and 2 drinks a day for men. One drink equals 12 oz of beer, 5 oz of wine, or 1 oz of hard liquor.  Work with your health care provider to maintain a healthy body weight or to lose weight. Ask what an ideal weight is for you.  Get at least 30 minutes of exercise that causes your heart to beat faster (aerobic exercise) most days of the week. Activities may include walking, swimming, or biking.  Work with your health care provider or diet and nutrition specialist (dietitian) to adjust your eating plan to your individual calorie needs. Reading food labels  Check food labels for the amount of sodium per serving. Choose foods with less than 5 percent of the Daily Value of sodium. Generally, foods with less than 300 mg of sodium per serving fit into this eating plan.  To find whole grains, look for the word "whole" as the first word in the ingredient list. Shopping  Buy products labeled as "low-sodium" or "no salt added."  Buy fresh foods. Avoid canned foods and premade or frozen meals. Cooking  Avoid adding salt when cooking. Use salt-free seasonings or herbs instead of table salt or sea salt. Check with your health care provider or pharmacist before using salt substitutes.  Doig not fry foods. Cook foods using healthy methods such as baking, boiling, grilling, and broiling instead.  Cook with  heart-healthy oils, such as olive, canola, soybean, or sunflower oil. Meal planning   Eat a balanced diet that includes: ? 5 or more servings of fruits and vegetables each day. At each meal, try to fill half of your plate with fruits and vegetables. ? Up to 6-8 servings of whole grains each day. ? Less than 6 oz of lean meat, poultry, or fish each day. A 3-oz serving of meat is about the same size as a deck of cards. One egg equals 1 oz. ? 2 servings of low-fat dairy each day. ? A serving of nuts, seeds, or beans 5 times each week. ? Heart-healthy fats. Healthy fats called Omega-3 fatty acids are found in foods such as flaxseeds and coldwater fish, like sardines, salmon, and mackerel.  Limit how much you eat of the following: ? Canned or prepackaged foods. ? Food that is high in trans fat, such as fried foods. ? Food that is high in saturated fat, such as fatty meat. ? Sweets, desserts, sugary drinks, and other foods with added sugar. ? Full-fat dairy products.  [place] not salt foods before eating.  Try to eat at least 2 vegetarian meals each week.  Eat more home-cooked food and less restaurant, buffet, and fast food.  When eating at a restaurant, ask that your food be prepared with less salt or no salt, if possible. What foods are recommended? The items listed may not be a complete list. Talk with your dietitian about what   dietary choices are best for you. Grains Whole-grain or whole-wheat bread. Whole-grain or whole-wheat pasta. Brown rice. Oatmeal. Quinoa. Bulgur. Whole-grain and low-sodium cereals. Pita bread. Low-fat, low-sodium crackers. Whole-wheat flour tortillas. Vegetables Fresh or frozen vegetables (raw, steamed, roasted, or grilled). Low-sodium or reduced-sodium tomato and vegetable juice. Low-sodium or reduced-sodium tomato sauce and tomato paste. Low-sodium or reduced-sodium canned vegetables. Fruits All fresh, dried, or frozen fruit. Canned fruit in natural juice (without  added sugar). Meat and other protein foods Skinless chicken or turkey. Ground chicken or turkey. Pork with fat trimmed off. Fish and seafood. Egg whites. Dried beans, peas, or lentils. Unsalted nuts, nut butters, and seeds. Unsalted canned beans. Lean cuts of beef with fat trimmed off. Low-sodium, lean deli meat. Dairy Low-fat (1%) or fat-free (skim) milk. Fat-free, low-fat, or reduced-fat cheeses. Nonfat, low-sodium ricotta or cottage cheese. Low-fat or nonfat yogurt. Low-fat, low-sodium cheese. Fats and oils Soft margarine without trans fats. Vegetable oil. Low-fat, reduced-fat, or light mayonnaise and salad dressings (reduced-sodium). Canola, safflower, olive, soybean, and sunflower oils. Avocado. Seasoning and other foods Herbs. Spices. Seasoning mixes without salt. Unsalted popcorn and pretzels. Fat-free sweets. What foods are not recommended? The items listed may not be a complete list. Talk with your dietitian about what dietary choices are best for you. Grains Baked goods made with fat, such as croissants, muffins, or some breads. Dry pasta or rice meal packs. Vegetables Creamed or fried vegetables. Vegetables in a cheese sauce. Regular canned vegetables (not low-sodium or reduced-sodium). Regular canned tomato sauce and paste (not low-sodium or reduced-sodium). Regular tomato and vegetable juice (not low-sodium or reduced-sodium). Pickles. Olives. Fruits Canned fruit in a light or heavy syrup. Fried fruit. Fruit in cream or butter sauce. Meat and other protein foods Fatty cuts of meat. Ribs. Fried meat. Bacon. Sausage. Bologna and other processed lunch meats. Salami. Fatback. Hotdogs. Bratwurst. Salted nuts and seeds. Canned beans with added salt. Canned or smoked fish. Whole eggs or egg yolks. Chicken or turkey with skin. Dairy Whole or 2% milk, cream, and half-and-half. Whole or full-fat cream cheese. Whole-fat or sweetened yogurt. Full-fat cheese. Nondairy creamers. Whipped toppings.  Processed cheese and cheese spreads. Fats and oils Butter. Stick margarine. Lard. Shortening. Ghee. Bacon fat. Tropical oils, such as coconut, palm kernel, or palm oil. Seasoning and other foods Salted popcorn and pretzels. Onion salt, garlic salt, seasoned salt, table salt, and sea salt. Worcestershire sauce. Tartar sauce. Barbecue sauce. Teriyaki sauce. Soy sauce, including reduced-sodium. Steak sauce. Canned and packaged gravies. Fish sauce. Oyster sauce. Cocktail sauce. Horseradish that you find on the shelf. Ketchup. Mustard. Meat flavorings and tenderizers. Bouillon cubes. Hot sauce and Tabasco sauce. Premade or packaged marinades. Premade or packaged taco seasonings. Relishes. Regular salad dressings. Where to find more information:  National Heart, Lung, and Blood Institute: www.nhlbi.nih.gov  American Heart Association: www.heart.org Summary  The DASH eating plan is a healthy eating plan that has been shown to reduce high blood pressure (hypertension). It may also reduce your risk for type 2 diabetes, heart disease, and stroke.  With the DASH eating plan, you should limit salt (sodium) intake to 2,300 mg a day. If you have hypertension, you may need to reduce your sodium intake to 1,500 mg a day.  When on the DASH eating plan, aim to eat more fresh fruits and vegetables, whole grains, lean proteins, low-fat dairy, and heart-healthy fats.  Work with your health care provider or diet and nutrition specialist (dietitian) to adjust your eating plan to your individual   calorie needs. This information is not intended to replace advice given to you by your health care provider. Make sure you discuss any questions you have with your health care provider. Document Released: 10/09/2011 Document Revised: 10/13/2016 Document Reviewed: 10/13/2016 Elsevier Interactive Patient Education  2018 Elsevier Inc.  

## 2018-09-27 NOTE — Assessment & Plan Note (Signed)
Check labs today Encouraged heart healthy diet, increase exercise, avoid trans fats, consider a krill oil cap daily 

## 2018-09-27 NOTE — Assessment & Plan Note (Signed)
Eczema-- refill triamcinolone

## 2018-09-27 NOTE — Assessment & Plan Note (Signed)
Well controlled, no changes to meds. Encouraged heart healthy diet such as the DASH diet and exercise as tolerated.  °

## 2018-11-16 ENCOUNTER — Encounter: Payer: Medicare Other | Admitting: Family Medicine

## 2018-11-16 DIAGNOSIS — Z0289 Encounter for other administrative examinations: Secondary | ICD-10-CM

## 2019-03-21 ENCOUNTER — Telehealth: Payer: Self-pay | Admitting: Family Medicine

## 2019-03-21 NOTE — Telephone Encounter (Signed)
Daughter in law called in stated she wanted to get an appt for blood pressure concerns. Her mother in law checked at it was 186/? Informed daughter she needs to seen either VOV or Urgent care. Daughter stated she will talk to her again and call back

## 2019-07-20 ENCOUNTER — Other Ambulatory Visit: Payer: Self-pay

## 2019-07-20 ENCOUNTER — Ambulatory Visit (INDEPENDENT_AMBULATORY_CARE_PROVIDER_SITE_OTHER): Payer: Medicare Other | Admitting: *Deleted

## 2019-07-20 DIAGNOSIS — Z23 Encounter for immunization: Secondary | ICD-10-CM

## 2019-12-04 ENCOUNTER — Ambulatory Visit: Payer: Medicare Other

## 2019-12-09 ENCOUNTER — Ambulatory Visit: Payer: Medicare Other

## 2019-12-09 ENCOUNTER — Ambulatory Visit: Payer: Medicare Other | Attending: Internal Medicine

## 2019-12-09 DIAGNOSIS — Z23 Encounter for immunization: Secondary | ICD-10-CM

## 2019-12-09 NOTE — Progress Notes (Signed)
   Covid-19 Vaccination Clinic  Name:  Ariel Williams    MRN: [id number] DOB: 1941/10/03  12/09/2019  [name] was observed post Covid-19 immunization for 15 minutes without incidence. She was provided with Vaccine Information Sheet and instruction to access the V-Safe system.   [name] was instructed to call 911 with any severe reactions post vaccine: Marland Kitchen Difficulty breathing  . Swelling of your face and throat  . A fast heartbeat  . A bad rash all over your body  . Dizziness and weakness    Immunizations Administered    Name Date Dose VIS Date Route   Pfizer COVID-19 Vaccine 12/09/2019  9:10 AM 0.3 mL 10/14/2019 Intramuscular   Manufacturer: [place]   Lot: [id number]   [place]: [id number]

## 2019-12-11 ENCOUNTER — Ambulatory Visit: Payer: Medicare Other

## 2020-01-03 ENCOUNTER — Ambulatory Visit: Payer: Medicare Other | Attending: Internal Medicine

## 2020-01-03 DIAGNOSIS — Z23 Encounter for immunization: Secondary | ICD-10-CM | POA: Insufficient documentation

## 2020-01-03 NOTE — Progress Notes (Signed)
   Covid-19 Vaccination Clinic  Name:  Ariel Williams    MRN: [id number] DOB: [birth year]  01/03/2020  Ariel Williams was observed post Covid-19 immunization for 15 minutes without incident. She was provided with Vaccine Information Sheet and instruction to access the V-Safe system.   Ariel Williams was instructed to call 911 with any severe reactions post vaccine: Marland Kitchen Difficulty breathing  . Swelling of face and throat  . A fast heartbeat  . A bad rash all over body  . Dizziness and weakness   Immunizations Administered    Name Date Dose VIS Date Route   Pfizer COVID-19 Vaccine 01/03/2020  9:28 AM 0.3 mL 10/14/2019 Intramuscular   Manufacturer: [place]   Lot: [id number]   [place]: [id number]

## 2020-01-06 ENCOUNTER — Other Ambulatory Visit: Payer: Self-pay

## 2020-01-09 ENCOUNTER — Ambulatory Visit (INDEPENDENT_AMBULATORY_CARE_PROVIDER_SITE_OTHER): Payer: Medicare Other | Admitting: Family Medicine

## 2020-01-09 ENCOUNTER — Other Ambulatory Visit: Payer: Self-pay

## 2020-01-09 ENCOUNTER — Other Ambulatory Visit: Payer: Self-pay | Admitting: Family Medicine

## 2020-01-09 ENCOUNTER — Encounter: Payer: Self-pay | Admitting: Family Medicine

## 2020-01-09 VITALS — BP 146/90 | HR 86 | Temp 97.7°F | Resp 18 | Ht 60.0 in | Wt 129.2 lb

## 2020-01-09 DIAGNOSIS — F419 Anxiety disorder, unspecified: Secondary | ICD-10-CM | POA: Diagnosis not present

## 2020-01-09 DIAGNOSIS — E785 Hyperlipidemia, unspecified: Secondary | ICD-10-CM

## 2020-01-09 DIAGNOSIS — Z0000 Encounter for general adult medical examination without abnormal findings: Secondary | ICD-10-CM | POA: Diagnosis not present

## 2020-01-09 DIAGNOSIS — Z1231 Encounter for screening mammogram for malignant neoplasm of breast: Secondary | ICD-10-CM

## 2020-01-09 DIAGNOSIS — R21 Rash and other nonspecific skin eruption: Secondary | ICD-10-CM

## 2020-01-09 DIAGNOSIS — R202 Paresthesia of skin: Secondary | ICD-10-CM

## 2020-01-09 DIAGNOSIS — I10 Essential (primary) hypertension: Secondary | ICD-10-CM

## 2020-01-09 DIAGNOSIS — E2839 Other primary ovarian failure: Secondary | ICD-10-CM

## 2020-01-09 DIAGNOSIS — R200 Anesthesia of skin: Secondary | ICD-10-CM

## 2020-01-09 LAB — LIPID PANEL
Cholesterol: 202 mg/dL — ABNORMAL HIGH (ref 0–200)
HDL: 40.8 mg/dL (ref >39.00)
NonHDL: 161.18
Total CHOL/HDL Ratio: 5
Triglycerides: 235 mg/dL — ABNORMAL HIGH (ref 0.0–149.0)
VLDL: 47 mg/dL — ABNORMAL HIGH (ref 0.0–40.0)

## 2020-01-09 LAB — COMPREHENSIVE METABOLIC PANEL
ALT: 12 U/L (ref 0–35)
AST: 19 U/L (ref 0–37)
Albumin: 4.6 g/dL (ref 3.5–5.2)
Alkaline Phosphatase: 62 U/L (ref 39–117)
BUN: 13 mg/dL (ref 6–23)
CO2: 29 mEq/L (ref 19–32)
Calcium: 9.9 mg/dL (ref 8.4–10.5)
Chloride: 102 mEq/L (ref 96–112)
Creatinine, Ser: 0.72 mg/dL (ref 0.40–1.20)
GFR: 78.17 mL/min (ref >60.00)
Glucose, Bld: 92 mg/dL (ref 70–99)
Potassium: 4.4 mEq/L (ref 3.5–5.1)
Sodium: 139 mEq/L (ref 135–145)
Total Bilirubin: 0.6 mg/dL (ref 0.2–1.2)
Total Protein: 7.8 g/dL (ref 6.0–8.3)

## 2020-01-09 LAB — LDL CHOLESTEROL, DIRECT: Direct LDL: 126 mg/dL

## 2020-01-09 MED ORDER — PREDNISONE 10 MG PO TABS: ORAL_TABLET | ORAL | 0 refills | Status: DC

## 2020-01-09 MED ORDER — NONFORMULARY OR COMPOUNDED ITEM: 0 refills | Status: AC

## 2020-01-09 MED ORDER — LISINOPRIL-HYDROCHLOROTHIAZIDE 20-25 MG PO TABS: 1.0000 | ORAL_TABLET | Freq: Every day | ORAL | 3 refills | Status: DC

## 2020-01-09 MED ORDER — ATORVASTATIN CALCIUM 20 MG PO TABS: 20.0000 mg | ORAL_TABLET | Freq: Every day | ORAL | 3 refills | Status: DC

## 2020-01-09 NOTE — Patient Instructions (Addendum)
You are due for a tetanus shot --- this you can get at the pharmacy     Preventive Care 79 Years and Older, Female Preventive care refers to lifestyle choices and visits with your health care provider that can promote health and wellness. This includes:  A yearly physical exam. This is also called an annual well check.  Regular dental and eye exams.  Immunizations.  Screening for certain conditions.  Healthy lifestyle choices, such as diet and exercise. What can I expect for my preventive care visit? Physical exam Your health care provider will check:  Height and weight. These may be used to calculate body mass index (BMI), which is a measurement that tells if you are at a healthy weight.  Heart rate and blood pressure.  Your skin for abnormal spots. Counseling Your health care provider may ask you questions about:  Alcohol, tobacco, and drug use.  Emotional well-being.  Home and relationship well-being.  Sexual activity.  Eating habits.  History of falls.  Memory and ability to understand (cognition).  Work and work Statistician.  Pregnancy and menstrual history. What immunizations Rey I need?  Influenza (flu) vaccine  This is recommended every year. Tetanus, diphtheria, and pertussis (Tdap) vaccine  You may need a Td booster every 10 years. Varicella (chickenpox) vaccine  You may need this vaccine if you have not already been vaccinated. Zoster (shingles) vaccine  You may need this after age 79. Pneumococcal conjugate (PCV13) vaccine  One dose is recommended after age 79. Pneumococcal polysaccharide (PPSV23) vaccine  One dose is recommended after age 79. Measles, mumps, and rubella (MMR) vaccine  You may need at least one dose of MMR if you were born in 1957 or later. You may also need a second dose. Meningococcal conjugate (MenACWY) vaccine  You may need this if you have certain conditions. Hepatitis A vaccine  You may need this if you have  certain conditions or if you travel or work in places where you may be exposed to hepatitis A. Hepatitis B vaccine  You may need this if you have certain conditions or if you travel or work in places where you may be exposed to hepatitis B. Haemophilus influenzae type b (Hib) vaccine  You may need this if you have certain conditions. You may receive vaccines as individual doses or as more than one vaccine together in one shot (combination vaccines). Talk with your health care provider about the risks and benefits of combination vaccines. What tests [place] I need? Blood tests  Lipid and cholesterol levels. These may be checked every 5 years, or more frequently depending on your overall health.  Hepatitis C test.  Hepatitis B test. Screening  Lung cancer screening. You may have this screening every year starting at age 79 if you have a 30-pack-year history of smoking and currently smoke or have quit within the past 15 years.  Colorectal cancer screening. All adults should have this screening starting at age 79 and continuing until age 92. Your health care provider may recommend screening at age 79 if you are at increased risk. You will have tests every 1-10 years, depending on your results and the type of screening test.  Diabetes screening. This is done by checking your blood sugar (glucose) after you have not eaten for a while (fasting). You may have this done every 1-3 years.  Mammogram. This may be done every 1-2 years. Talk with your health care provider about how often you should have regular mammograms.  BRCA-related  cancer screening. This may be done if you have a family history of breast, ovarian, tubal, or peritoneal cancers. Other tests  Sexually transmitted disease (STD) testing.  Bone density scan. This is done to screen for osteoporosis. You may have this done starting at age 79. Follow these instructions at home: Eating and drinking  Eat a diet that includes fresh fruits  and vegetables, whole grains, lean protein, and low-fat dairy products. Limit your intake of foods with high amounts of sugar, saturated fats, and salt.  Take vitamin and mineral supplements as recommended by your health care provider.  [place] not drink alcohol if your health care provider tells you not to drink.  If you drink alcohol: ? Limit how much you have to 0-1 drink a day. ? Be aware of how much alcohol is in your drink. In the [place] one drink equals one 12 oz bottle of beer (355 mL), one 5 oz glass of wine (148 mL), or one 1 oz glass of hard liquor (44 mL). Lifestyle  Take daily care of your teeth and gums.  Stay active. Exercise for at least 30 minutes on 5 or more days each week.  [place] not use any products that contain nicotine or tobacco, such as cigarettes, e-cigarettes, and chewing tobacco. If you need help quitting, ask your health care provider.  If you are sexually active, practice safe sex. Use a condom or other form of protection in order to prevent STIs (sexually transmitted infections).  Talk with your health care provider about taking a low-dose aspirin or statin. What's next?  Go to your health care provider once a year for a well check visit.  Ask your health care provider how often you should have your eyes and teeth checked.  Stay up to date on all vaccines. This information is not intended to replace advice given to you by your health care provider. Make sure you discuss any questions you have with your health care provider. Document Revised: 10/14/2018 Document Reviewed: 10/14/2018 Elsevier Patient Education  2020 [employer].

## 2020-01-09 NOTE — Progress Notes (Signed)
Subjective:     Ariel Williams is a 79 y.o. female and is here for a comprehensive physical exam. The patient reports problems - rash on arms and face that is very itchy. She has used otc cream which helps but not completely She also c/o numbness in L arm that comes and goes.  She wakes up with it and can shake it out   Social History   Socioeconomic History  . Marital status: Married    Spouse name: Not on file  . Number of children: 18  . Years of education: Not on file  . Highest education level: Not on file  Occupational History  . Not on file  Tobacco Use  . Smoking status: Never Smoker  . Smokeless tobacco: Never Used  Substance and Sexual Activity  . Alcohol use: No    Alcohol/week: 0.0 standard drinks  . Drug use: No  . Sexual activity: Not on file  Other Topics Concern  . Not on file  Social History Narrative   From [name] been 64 years lives with husband   Social Determinants of Health   Financial Resource Strain:   . Difficulty of Paying Living Expenses: Not on file  Food Insecurity:   . Worried About Charity fundraiser in the Last Year: Not on file  . Ran Out of Food in the Last Year: Not on file  Transportation Needs:   . Lack of Transportation (Medical): Not on file  . Lack of Transportation (Non-Medical): Not on file  Physical Activity:   . Days of Exercise per Week: Not on file  . Minutes of Exercise per Session: Not on file  Stress:   . Feeling of Stress : Not on file  Social Connections:   . Frequency of Communication with Friends and Family: Not on file  . Frequency of Social Gatherings with Friends and Family: Not on file  . Attends Religious Services: Not on file  . Active Member of Clubs or Organizations: Not on file  . Attends Archivist Meetings: Not on file  . Marital Status: Not on file  Intimate Partner Violence:   . Fear of Current or Ex-Partner: Not on file  . Emotionally Abused: Not on file  . Physically Abused: Not on file  .  Sexually Abused: Not on file   Health Maintenance  Topic Date Due  . TETANUS/TDAP  03/12/1960  . MAMMOGRAM  06/24/2017  . INFLUENZA VACCINE  Completed  . DEXA SCAN  Completed  . PNA vac Low Risk Adult  Completed    The following portions of the patient's history were reviewed and updated as appropriate:  She  has a past medical history of Arthritis and H. pylori infection. She does not have any pertinent problems on file. She  has a past surgical history that includes Tubal ligation. Her family history includes Asthma in her brother; Stroke in her brother. She  reports that she has never smoked. She has never used smokeless tobacco. She reports that she does not drink alcohol or use drugs. She has a current medication list which includes the following prescription(s): atorvastatin, lisinopril-hydrochlorothiazide, sertraline, and triamcinolone cream. Current Outpatient Medications on File Prior to Visit  Medication Sig Dispense Refill  . atorvastatin (LIPITOR) 20 MG tablet Take 1 tablet (20 mg total) by mouth daily. 30 tablet 2  . lisinopril-hydrochlorothiazide (PRINZIDE,ZESTORETIC) 20-25 MG tablet Take 1 tablet by mouth daily. 30 tablet 3  . sertraline (ZOLOFT) 25 MG tablet Take 1 tablet (  25 mg total) by mouth daily. 30 tablet 2  . triamcinolone cream (KENALOG) 0.1 % Apply 1 application topically 2 (two) times daily. 454 g 2   No current facility-administered medications on file prior to visit.   She is allergic to tramadol hcl..  Review of Systems  Review of Systems  Constitutional: Negative for activity change, appetite change and fatigue.  HENT: Negative for hearing loss, congestion, tinnitus and ear discharge.   Eyes: Negative for visual disturbance (see optho q1y -- vision corrected to 20/20 with glasses).  Respiratory: Negative for cough, chest tightness and shortness of breath.   Cardiovascular: Negative for chest pain, palpitations and leg swelling.  Gastrointestinal:  Negative for abdominal pain, diarrhea, constipation and abdominal distention.  Genitourinary: Negative for urgency, frequency, decreased urine volume and difficulty urinating.  Musculoskeletal: Negative for back pain, arthralgias and gait problem.  Skin: Negative for color change, pallor and rash.  Neurological: Negative for dizziness, light-headedness, numbness and headaches.  Hematological: Negative for adenopathy. Does not bruise/bleed easily.  Psychiatric/Behavioral: Negative for suicidal ideas, confusion, sleep disturbance, self-injury, dysphoric mood, decreased concentration and agitation.  Pt is able to read and write and can [place] all ADLs No risk for falling No abuse/ violence in home    Objective:    BP (!) 146/90 (BP Location: Left Arm, Patient Position: Sitting, Cuff Size: Normal)   Pulse 86   Temp 97.7 F (36.5 C) (Temporal)   Resp 18   Ht 5' (1.524 m)   Wt 129 lb 3.2 oz (58.6 kg)   SpO2 95%   BMI 25.23 kg/m  General appearance: alert, cooperative, appears stated age and no distress Head: Normocephalic, without obvious abnormality, atraumatic Eyes: conjunctivae/corneas clear. PERRL, EOM's intact. Fundi benign. Ears: normal TM's and external ear canals both ears Neck: no adenopathy, no carotid bruit, no JVD, supple, symmetrical, trachea midline and thyroid not enlarged, symmetric, no tenderness/mass/nodules Back: symmetric, no curvature. ROM normal. No CVA tenderness. Lungs: clear to auscultation bilaterally Breasts: normal appearance, no masses or tenderness Heart: regular rate and rhythm, S1, S2 normal, no murmur, click, rub or gallop Abdomen: soft, non-tender; bowel sounds normal; no masses,  no organomegaly Pelvic: not indicated; post-menopausal, no abnormal Pap smears in past Extremities: extremities normal, atraumatic, no cyanosis or edema Pulses: 2+ and symmetric Skin: papular - face, arm(s) bilateral Lymph nodes: Cervical, supraclavicular, and axillary nodes  normal. Neurologic: Alert and oriented X 3, normal strength and tone. Normal symmetric reflexes. Normal coordination and gait    Assessment:    Healthy female exam.      Plan:    ghm utd Check labs  See After Visit Summary for Counseling  Recommendations    1. Essential hypertension Poorly controlled will alter medications, encouraged DASH diet, minimize caffeine and obtain adequate sleep. Report concerning symptoms and follow up as directed and as needed - lisinopril-hydrochlorothiazide (ZESTORETIC) 20-25 MG tablet; Take 1 tablet by mouth daily.  Dispense: 90 tablet; Refill: 3 - Lipid panel - Comprehensive metabolic panel - NONFORMULARY OR COMPOUNDED ITEM; bp cuff  #1  Dx htn  Dispense: 1 each; Refill: 0  2. Anxiety Pt states she is doing well and she does not want to take the zoloft  3. Hyperlipidemia, unspecified hyperlipidemia type Encouraged heart healthy diet, increase exercise, avoid trans fats, consider a krill oil cap daily - atorvastatin (LIPITOR) 20 MG tablet; Take 1 tablet (20 mg total) by mouth daily.  Dispense: 90 tablet; Refill: 3 - Lipid panel - Comprehensive metabolic panel  4. Rash pred taper  Derm if no better  - predniSONE (DELTASONE) 10 MG tablet; TAKE 3 TABLETS PO QD FOR 3 DAYS THEN TAKE 2 TABLETS PO QD FOR 3 DAYS THEN TAKE 1 TABLET PO QD FOR 3 DAYS THEN TAKE 1/2 TAB PO QD FOR 3 DAYS  Dispense: 20 tablet; Refill: 0  5. Estrogen deficiency  - DG Bone Density; Future  6. Encounter for screening mammogram for malignant neoplasm of breast   - MM DIGITAL SCREENING BILATERAL; Future  7. Preventative health care See above Pt needs tetanus--she will d/w pharmacy  8, numbness L arm-----  Comes and goes--- will call if worsens  ?CTS

## 2020-01-10 ENCOUNTER — Ambulatory Visit (HOSPITAL_BASED_OUTPATIENT_CLINIC_OR_DEPARTMENT_OTHER)
Admission: RE | Admit: 2020-01-10 | Discharge: 2020-01-10 | Disposition: A | Discharge status: Home / Self Care | Payer: Medicare Other | Source: Ambulatory Visit | Attending: Family Medicine | Admitting: Family Medicine

## 2020-01-10 DIAGNOSIS — Z1231 Encounter for screening mammogram for malignant neoplasm of breast: Secondary | ICD-10-CM | POA: Diagnosis not present

## 2020-01-10 DIAGNOSIS — E2839 Other primary ovarian failure: Secondary | ICD-10-CM | POA: Diagnosis present

## 2020-01-10 DIAGNOSIS — M8589 Other specified disorders of bone density and structure, multiple sites: Secondary | ICD-10-CM | POA: Diagnosis not present

## 2020-01-11 ENCOUNTER — Other Ambulatory Visit: Payer: Self-pay

## 2020-01-11 MED ORDER — ATORVASTATIN CALCIUM 40 MG PO TABS: 40.0000 mg | ORAL_TABLET | Freq: Every day | ORAL | 2 refills | Status: DC

## 2020-05-28 ENCOUNTER — Encounter: Payer: Self-pay | Admitting: Family Medicine

## 2020-05-28 ENCOUNTER — Ambulatory Visit (INDEPENDENT_AMBULATORY_CARE_PROVIDER_SITE_OTHER): Payer: Medicare Other | Admitting: Family Medicine

## 2020-05-28 ENCOUNTER — Other Ambulatory Visit: Payer: Self-pay

## 2020-05-28 VITALS — BP 118/80 | HR 86 | Temp 98.0°F | Resp 18 | Ht 60.0 in | Wt 132.6 lb

## 2020-05-28 DIAGNOSIS — R21 Rash and other nonspecific skin eruption: Secondary | ICD-10-CM

## 2020-05-28 DIAGNOSIS — R768 Other specified abnormal immunological findings in serum: Secondary | ICD-10-CM | POA: Diagnosis not present

## 2020-05-28 MED ORDER — MOMETASONE FUROATE 0.1 % EX CREA: 1.0000 "application " | TOPICAL_CREAM | Freq: Every day | CUTANEOUS | 0 refills | Status: DC

## 2020-05-28 MED ORDER — MOMETASONE FUROATE 0.1 % EX CREA: 1.0000 "application " | TOPICAL_CREAM | Freq: Every day | CUTANEOUS | 2 refills | Status: DC

## 2020-05-28 NOTE — Patient Instructions (Signed)
Rash, Adult A rash is a change in the color of your skin. A rash can also change the way your skin feels. There are many different conditions and factors that can cause a rash. Some rashes may disappear after a few days, but some may last for a few weeks. Common causes of rashes include:  Viral infections, such as: ? Colds. ? Measles. ? Hand, foot, and mouth disease.  Bacterial infections, such as: ? Scarlet fever. ? Impetigo.  Fungal infections, such as Candida.  Allergic reactions to food, medicines, or skin care products. Follow these instructions at home: The goal of treatment is to stop the itching and keep the rash from spreading. Pay attention to any changes in your symptoms. Follow these instructions to help with your condition: Medicine Take or apply over-the-counter and prescription medicines only as told by your health care provider. These may include:  Corticosteroid creams to treat red or swollen skin.  Anti-itch lotions.  Oral allergy medicines (antihistamines).  Oral corticosteroids for severe symptoms.  Skin care  Apply cool compresses to the affected areas.  [place] not scratch or rub your skin.  Avoid covering the rash. Make sure the rash is exposed to air as much as possible. Managing itching and discomfort  Avoid hot showers or baths, which can make itching worse. A cold shower may help.  Try taking a bath with: ? Epsom salts. Follow manufacturer instructions on the packaging. You can get these at your local pharmacy or grocery store. ? Baking soda. Pour a small amount into the bath as told by your health care provider. ? Colloidal oatmeal. Follow manufacturer instructions on the packaging. You can get this at your local pharmacy or grocery store.  Try applying baking soda paste to your skin. Stir water into baking soda until it reaches a paste-like consistency.  Try applying calamine lotion. This is an over-the-counter lotion that helps to relieve  itchiness.  Keep cool and out of the sun. Sweating and being hot can make itching worse. General instructions   Rest as needed.  Drink enough fluid to keep your urine pale yellow.  Wear loose-fitting clothing.  Avoid scented soaps, detergents, and perfumes. Use gentle soaps, detergents, perfumes, and other cosmetic products.  Avoid any substance that causes your rash. Keep a journal to help track what causes your rash. Write down: ? What you eat. ? What cosmetic products you use. ? What you drink. ? What you wear. This includes jewelry.  Keep all follow-up visits as told by your health care provider. This is important. Contact a health care provider if:  You sweat at night.  You lose weight.  You urinate more than normal.  You urinate less than normal, or you notice that your urine is a darker color than usual.  You feel weak.  You vomit.  Your skin or the whites of your eyes look yellow (jaundice).  Your skin: ? Tingles. ? Is numb.  Your rash: ? Does not go away after several days. ? Gets worse.  You are: ? Unusually thirsty. ? More tired than normal.  You have: ? New symptoms. ? Pain in your abdomen. ? A fever. ? Diarrhea. Get help right away if you:  Have a fever and your symptoms suddenly get worse.  Develop confusion.  Have a severe headache or a stiff neck.  Have severe joint pains or stiffness.  Have a seizure.  Develop a rash that covers all or most of your body. The rash may   or may not be painful.  Develop blisters that: ? Are on top of the rash. ? Grow larger or grow together. ? Are painful. ? Are inside your nose or mouth.  Develop a rash that: ? Looks like purple pinprick-sized spots all over your body. ? Has a "bull's eye" or looks like a target. ? Is not related to sun exposure, is red and painful, and causes your skin to peel. Summary  A rash is a change in the color of your skin. Some rashes disappear after a few days,  but some may last for a few weeks.  The goal of treatment is to stop the itching and keep the rash from spreading.  Take or apply over-the-counter and prescription medicines only as told by your health care provider.  Contact a health care provider if you have new or worsening symptoms.  Keep all follow-up visits as told by your health care provider. This is important. This information is not intended to replace advice given to you by your health care provider. Make sure you discuss any questions you have with your health care provider. Document Revised: 02/11/2019 Document Reviewed: 05/24/2018 Elsevier Patient Education  2020 Elsevier Inc.  

## 2020-05-28 NOTE — Progress Notes (Signed)
Patient ID: NATALIAH HATLESTAD, female    DOB: [birth year]  Age: 79 y.o. MRN: [id number]    Subjective:  Subjective  HPI Zamora T Highfill presents for rash on her face -- cheeks and nose that comes and goes for the last 2-3 months--- it occurs about 1x a month    No new lotions, detergents etc  Review of Systems  Constitutional: Negative for appetite change, diaphoresis, fatigue and unexpected weight change.  Eyes: Negative for pain, redness and visual disturbance.  Respiratory: Negative for cough, chest tightness, shortness of breath and wheezing.   Cardiovascular: Negative for chest pain, palpitations and leg swelling.  Endocrine: Negative for cold intolerance, heat intolerance, polydipsia, polyphagia and polyuria.  Genitourinary: Negative for difficulty urinating, dysuria and frequency.  Skin: Positive for rash.  Neurological: Negative for dizziness, light-headedness, numbness and headaches.    History Past Medical History:  Diagnosis Date  . Arthritis   . H. pylori infection     She has a past surgical history that includes Tubal ligation.   Her family history includes Asthma in her brother; Stroke in her brother.She reports that she has never smoked. She has never used smokeless tobacco. She reports that she does not drink alcohol and does not use drugs.  Current Outpatient Medications on File Prior to Visit  Medication Sig Dispense Refill  . atorvastatin (LIPITOR) 40 MG tablet Take 1 tablet (40 mg total) by mouth daily. 30 tablet 2  . lisinopril-hydrochlorothiazide (ZESTORETIC) 20-25 MG tablet Take 1 tablet by mouth daily. 90 tablet 3  . NONFORMULARY OR COMPOUNDED ITEM bp cuff  #1  Dx htn 1 each 0  . sertraline (ZOLOFT) 25 MG tablet Take 1 tablet (25 mg total) by mouth daily. 30 tablet 2  . triamcinolone cream (KENALOG) 0.1 % Apply 1 application topically 2 (two) times daily. 454 g 2  . predniSONE (DELTASONE) 10 MG tablet TAKE 3 TABLETS PO QD FOR 3 DAYS THEN TAKE 2 TABLETS PO QD FOR 3 DAYS  THEN TAKE 1 TABLET PO QD FOR 3 DAYS THEN TAKE 1/2 TAB PO QD FOR 3 DAYS (Patient not taking: Reported on 05/28/2020) 20 tablet 0   No current facility-administered medications on file prior to visit.     Objective:  Objective  Physical Exam Vitals and nursing note reviewed.  Constitutional:      Appearance: She is well-developed.  HENT:     Head: Normocephalic and atraumatic.   Eyes:     Conjunctiva/sclera: Conjunctivae normal.  Neck:     Thyroid: No thyromegaly.     Vascular: No carotid bruit or JVD.  Cardiovascular:     Rate and Rhythm: Normal rate and regular rhythm.     Heart sounds: Normal heart sounds. No murmur heard.   Pulmonary:     Effort: Pulmonary effort is normal. No respiratory distress.     Breath sounds: Normal breath sounds. No wheezing or rales.  Chest:     Chest wall: No tenderness.  Musculoskeletal:     Cervical back: Normal range of motion and neck supple.  Skin:    Findings: Erythema and rash present.  Neurological:     Mental Status: She is alert and oriented to person, place, and time.    BP 118/80   Pulse 86   Temp 98 F (36.7 C) (Oral)   Resp 18   Ht 5' (1.524 m)   Wt 132 lb 9.6 oz (60.1 kg)   SpO2 97%   BMI 25.90 kg/m  Wt  Readings from Last 3 Encounters:  05/28/20 132 lb 9.6 oz (60.1 kg)  01/09/20 129 lb 3.2 oz (58.6 kg)  09/27/18 124 lb 3.2 oz (56.3 kg)     Lab Results  Component Value Date   WBC 4.6 08/16/2018   HGB 13.0 08/16/2018   HCT 39.0 08/16/2018   PLT 242.0 08/16/2018   GLUCOSE 92 01/09/2020   CHOL 202 (H) 01/09/2020   TRIG 235.0 (H) 01/09/2020   HDL 40.80 01/09/2020   LDLDIRECT 126.0 01/09/2020   LDLCALC 119 (H) 08/16/2018   ALT 12 01/09/2020   AST 19 01/09/2020   NA 139 01/09/2020   K 4.4 01/09/2020   CL 102 01/09/2020   CREATININE 0.72 01/09/2020   BUN 13 01/09/2020   CO2 29 01/09/2020   TSH 2.36 02/18/2016    DG Bone Density  Result Date: 01/10/2020 EXAM: DUAL X-RAY ABSORPTIOMETRY (DXA) FOR BONE  MINERAL DENSITY IMPRESSION: [place]  Your patient Terrianne Defelice completed a BMD test on 01/10/2020 using the [place] (analysis version: 16.SP2) manufactured by [employer]. The following summarizes the results of our evaluation. [facility] PATIENT: Name: Abisola, Carrero Patient ID: [id number] Birth Date: [birth year] Height: 59.5 in. Gender: Female Measured: 01/10/2020 Weight: 128.0 lbs. Indications: Advanced Age, Estrogen Deficiency, History of Osteoporosis, Hysterectomy, Low Calcium Intake, Oophorectomy ( Bilateral), Post Menopausal, Vietnamese Fractures: Treatments: Multivitamin ASSESSMENT: The BMD measured at Femur Neck Right is 0.790 g/cm2 with a T-score of -1.8. This patient is considered osteopenic according to [facility]) criteria. Compared with the prior study on, 06/24/2016 the BMD of the total mean shows a statistically significant increase. The scan quality is good. Site Region Measured Date Measured Age WHO YA BMD Classification T-score AP Spine L1-L4 (L3) 01[age]10/[age] 78.8 Osteopenia -1.4 1.003 g/cm2 AP Spine L1-L4 (L3) 06/24/2016 75.2 Osteopenia -1.7 0.970 g/cm2 DualFemur Neck Right 01/10/2020 78.8 Osteopenia -1.8 0.790 g/cm2 DualFemur Neck Right 06/24/2016 75.2 Osteopenia -1.5 0.823 g/cm2 DualFemur Total Mean 01/10/2020 78.8 Normal -1.0 0.876 g/cm2 DualFemur Total Mean 06/24/2016 75.2 Osteopenia -1.3 0.848 g/cm2 World Health Organization [facility]) criteria for post-menopausal, Caucasian Women: Normal       T-score at or above -1 SD Osteopenia   T-score between -1 and -2.5 SD Osteoporosis T-score at or below -2.5 SD RECOMMENDATION:1. All patients should optimize calcium and vitamin D intake. 2. Consider FDA-approved medical therapies in postmenopausal women and men aged 77 years and older, based on the following: a. A hip or vertebral(clinical or morphometric) fracture. b. T-Score < -2.5 at the femoral neck or spine after appropriate evaluation to exclude secondary causes c. Low  bone mass (T-score between -1.0 and -2.5 at the femoral neck or spine) and a 10 year probability of a hip fracture >3% or a 10 year probability of major osteoporosis-related fracture > 20% based on the [place]-adapted WHO algorithm d. Clinical judgement and/or patient preferences may indicate treatment for people with 10-year fracture probabilities above or below these levels FOLLOW-UP: Patients with diagnosis of osteoporosis or at high risk for fracture should have regular bone mineral density tests. For patients eligible for Medicare, routine testing is allowed once every 2 years. The testing frequency can be increased to one year for patients who have rapidly progressing disease, those who are receiving or discontinuing medical therapy to restore bone mass, or have additional risk factors. I have reviewed this report, anf agree with the above findings. [facility] Radiology Patient: Rachel Bo Referring Physician: Rosalita Chessman CHASE Birth Date: Oct 29, 1941  [age] Patient ID: [id number] Height: 59.5 in. Weight: 128.0 lbs. Measured: 01/10/2020 8:30:48 AM (16 SP 4) Gender: Female Ethnicity: Asian Analyzed: 01/10/2020 8:35:04 AM (16 SP 4) FRAX* 10-year Probability of Fracture Based on femoral neck BMD: DualFemur (Right) Major Osteoporotic Fracture: 8.7% Hip Fracture:                2.3% Population:                  [place] (Asian) Risk Factors:                None *FRAX is a Materials engineer of the [employer] for Metabolic Bone Disease, a [place] (WHO) [facility]. ASSESSMENT: The probability of a major osteoporotic fracture is 8.7% within the next ten years. The probability of a hip fracture is 2.3% within the next ten years. Electronically Signed   By: Lowella Grip III M.D.   On: 01/10/2020 09:13   MM 3D SCREEN BREAST BILATERAL  Result Date: 01/10/2020 CLINICAL DATA:  Screening. EXAM: DIGITAL SCREENING BILATERAL MAMMOGRAM WITH TOMO AND CAD  COMPARISON:  Previous exam(s). ACR Breast Density Category b: There are scattered areas of fibroglandular density. FINDINGS: There are no findings suspicious for malignancy. Images were processed with CAD. IMPRESSION: No mammographic evidence of malignancy. A result letter of this screening mammogram will be mailed directly to the patient. RECOMMENDATION: Screening mammogram in one year. (Code:SM-B-01Y) BI-RADS CATEGORY  1: Negative. Electronically Signed   By: Abelardo Diesel M.D.   On: 01/10/2020 13:25     Assessment & Plan:  Plan  I am having Natasa T. Hinzman maintain her sertraline, triamcinolone cream, lisinopril-hydrochlorothiazide, predniSONE, NONFORMULARY OR COMPOUNDED ITEM, atorvastatin, and mometasone.  Meds ordered this encounter  Medications  . DISCONTD: mometasone (ELOCON) 0.1 % cream    Sig: Apply 1 application topically daily.    Dispense:  45 g    Refill:  0  . mometasone (ELOCON) 0.1 % cream    Sig: Apply 1 application topically daily.    Dispense:  45 g    Refill:  2    Problem List Items Addressed This Visit      Unprioritized   Rash - Primary   Relevant Medications   mometasone (ELOCON) 0.1 % cream   Other Relevant Orders   CBC with Differential/Platelet   Comprehensive metabolic panel   Antinuclear Antib (ANA)   Sedimentation rate    refer to derm if no better in 1-2 weeks   Follow-up: Return if symptoms worsen or fail to improve.  [name], [place]

## 2020-05-29 LAB — CBC WITH DIFFERENTIAL/PLATELET
Basophils Absolute: 0 10*3/uL (ref 0.0–0.1)
Basophils Relative: 0.9 % (ref 0.0–3.0)
Eosinophils Absolute: 0.2 10*3/uL (ref 0.0–0.7)
Eosinophils Relative: 2.7 % (ref 0.0–5.0)
HCT: 37.8 % (ref 36.0–46.0)
Hemoglobin: 12.8 g/dL (ref 12.0–15.0)
Lymphocytes Relative: 35.9 % (ref 12.0–46.0)
Lymphs Abs: 2 10*3/uL (ref 0.7–4.0)
MCHC: 33.8 g/dL (ref 30.0–36.0)
MCV: 92.4 fl (ref 78.0–100.0)
Monocytes Absolute: 0.5 10*3/uL (ref 0.1–1.0)
Monocytes Relative: 8.8 % (ref 3.0–12.0)
Neutro Abs: 2.9 10*3/uL (ref 1.4–7.7)
Neutrophils Relative %: 51.7 % (ref 43.0–77.0)
Platelets: 236 10*3/uL (ref 150.0–400.0)
RBC: 4.09 Mil/uL (ref 3.87–5.11)
RDW: 12.9 % (ref 11.5–15.5)
WBC: 5.7 10*3/uL (ref 4.0–10.5)

## 2020-05-29 LAB — COMPREHENSIVE METABOLIC PANEL
ALT: 16 U/L (ref 0–35)
AST: 23 U/L (ref 0–37)
Albumin: 4.6 g/dL (ref 3.5–5.2)
Alkaline Phosphatase: 54 U/L (ref 39–117)
BUN: 17 mg/dL (ref 6–23)
CO2: 30 mEq/L (ref 19–32)
Calcium: 9.7 mg/dL (ref 8.4–10.5)
Chloride: 104 mEq/L (ref 96–112)
Creatinine, Ser: 0.89 mg/dL (ref 0.40–1.20)
GFR: 61.15 mL/min (ref >60.00)
Glucose, Bld: 114 mg/dL — ABNORMAL HIGH (ref 70–99)
Potassium: 4.1 mEq/L (ref 3.5–5.1)
Sodium: 139 mEq/L (ref 135–145)
Total Bilirubin: 0.7 mg/dL (ref 0.2–1.2)
Total Protein: 7.5 g/dL (ref 6.0–8.3)

## 2020-05-29 LAB — SEDIMENTATION RATE: Sed Rate: 4 mm/hr (ref 0–30)

## 2020-05-31 LAB — ANTI-NUCLEAR AB-TITER (ANA TITER): ANA Titer 1: 1:40 {titer} — ABNORMAL HIGH

## 2020-05-31 LAB — ANA: Anti Nuclear Antibody (ANA): POSITIVE — AB

## 2020-06-02 NOTE — Addendum Note (Signed)
Addended by: Wynonia Musty A on: 06/02/2020 12:05 PM   Modules accepted: Orders

## 2020-07-11 DIAGNOSIS — H2513 Age-related nuclear cataract, bilateral: Secondary | ICD-10-CM | POA: Diagnosis not present

## 2020-07-11 DIAGNOSIS — H40033 Anatomical narrow angle, bilateral: Secondary | ICD-10-CM | POA: Diagnosis not present

## 2020-07-13 ENCOUNTER — Other Ambulatory Visit: Payer: Self-pay | Admitting: Family Medicine

## 2020-07-13 DIAGNOSIS — R21 Rash and other nonspecific skin eruption: Secondary | ICD-10-CM

## 2020-11-04 NOTE — Progress Notes (Signed)
 Office Visit Note  Patient: Ariel Williams             Date of Birth: 07/09/1941           MRN: [id number]             PCP: Lowne Chase, Yvonne R, Cueva Referring: Lowne Chase, Yvonne R, * Visit Date: 11/08/2020 Occupation: @GUAROCC@  Interpreter: Yang H'mok  Subjective:  Rash and positive ANA.   History of Present Illness: Ariel Williams is a 79 y.o. female seen in consultation per request of Dr. Lowne.  According to patient she had developed a rash on her face in July for which she was seen by her PCP.  She was given some topical agents and the rash resolved.  She had no recurrence of the rash.  At that time the labs obtained showed positive ANA.  For that reason she was referred to me.  She denies any history of joint pain or joint swelling.  She denies any history of fatigue, oral ulcers, nasal ulcers, malar rash, photosensitivity, Raynaud's phenomenon, lymphadenopathy.  There is no family history of autoimmune disease.  She is gravida 10, para 8, miscarriages 2.  Activities of Daily Living:  Patient reports morning stiffness for 5 minutes.   Patient Reports nocturnal pain.  Difficulty dressing/grooming: Denies Difficulty climbing stairs: Reports Difficulty getting out of chair: Denies Difficulty using hands for taps, buttons, cutlery, and/or writing: Denies  Review of Systems  Constitutional: Positive for fatigue. Negative for night sweats, weight gain and weight loss.  HENT: Negative for mouth sores, trouble swallowing, trouble swallowing, mouth dryness and nose dryness.   Eyes: Positive for dryness. Negative for pain, redness, itching and visual disturbance.  Respiratory: Negative for cough, shortness of breath and difficulty breathing.   Cardiovascular: Negative for chest pain, palpitations, hypertension, irregular heartbeat and swelling in legs/feet.  Gastrointestinal: Positive for constipation. Negative for blood in stool and diarrhea.  Endocrine: Negative for increased urination.   Genitourinary: Negative for difficulty urinating and vaginal dryness.  Musculoskeletal: Positive for arthralgias, joint pain and morning stiffness. Negative for joint swelling, myalgias, muscle weakness, muscle tenderness and myalgias.  Skin: Positive for sensitivity to sunlight. Negative for color change, rash, hair loss, redness, skin tightness and ulcers.  Allergic/Immunologic: Negative for susceptible to infections.  Neurological: Positive for numbness and memory loss. Negative for dizziness, headaches, night sweats and weakness.  Hematological: Negative for bruising/bleeding tendency and swollen glands.  Psychiatric/Behavioral: Positive for depressed mood and sleep disturbance. Negative for confusion. The patient is not nervous/anxious.     PMFS History:  Patient Active Problem List   Diagnosis Date Noted  . Numbness and tingling in left arm 01/09/2020  . Rash 09/27/2018  . Hyperlipidemia LDL goal <100 09/06/2018  . Essential hypertension 08/16/2018  . Hives 08/16/2018  . Preventative health care 08/11/2017  . Fatigue 09/11/2015  . SINUSITIS - ACUTE-NOS 12/10/2010  . CONTACT DERMATITIS&OTH ECZEMA DUE OTH SPEC AGENT 10/07/2010  . NECK SPRAIN AND STRAIN 10/07/2010  . RESTLESS LEG SYNDROME 11/19/2009  . 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 GERD 11/23/2008  . DISUSE OSTEOPOROSIS 11/23/2008  . UNSPECIFIED OTITIS MEDIA 11/09/2008  . DYSPHAGIA UNSPECIFIED 11/09/2008  . SHINGLES, HX OF 01/20/2008  . UNS ADVRS EFF UNS RX MEDICINAL&BIOLOGICAL SBSTNC 01/17/2008  . POSTMENOPAUSAL STATUS 07/26/2007  . Chest pain, unspecified 07/26/2007    Past Medical History:  Diagnosis Date  . Arthritis   . H. pylori   infection     Family History  Problem Relation Age of Onset  . Asthma Brother    Past Surgical History:  Procedure Laterality Date  . TUBAL LIGATION     Social History   Social History Narrative   From [name] been 73  years lives with husband   Immunization History  Administered Date(s) Administered  . Fluad Quad(high Dose 65+) 07/20/2019  . Influenza Split 08/05/2012  . Influenza Whole 11/19/2009, 07/17/2010, 07/19/2011  . Influenza, High Dose Seasonal PF 08/11/2017, 08/16/2018  . Influenza,inj,Quad PF,6+ Mos 11/18/2013, 11/22/2015  . PFIZER SARS-COV-2 Vaccination 12/09/2019, 01/03/2020  . Pneumococcal Conjugate-13 08/11/2017  . Pneumococcal Polysaccharide-23 07/26/2007     Objective: Vital Signs: BP (!) 173/80 (BP Location: Right Arm, Patient Position: Sitting, Cuff Size: Normal)   Pulse 73   Resp 13   Ht 5' (1.524 m)   Wt 114 lb 3.2 oz (51.8 kg)   BMI 22.30 kg/m    Physical Exam Vitals and nursing note reviewed.  Constitutional:      Appearance: She is well-developed and well-nourished.  HENT:     Head: Normocephalic and atraumatic.  Eyes:     Extraocular Movements: EOM normal.     Conjunctiva/sclera: Conjunctivae normal.  Cardiovascular:     Rate and Rhythm: Normal rate and regular rhythm.     Pulses: Intact distal pulses.     Heart sounds: Normal heart sounds.  Pulmonary:     Effort: Pulmonary effort is normal.     Breath sounds: Normal breath sounds.  Abdominal:     General: Bowel sounds are normal.     Palpations: Abdomen is soft.  Musculoskeletal:     Cervical back: Normal range of motion.  Lymphadenopathy:     Cervical: No cervical adenopathy.  Skin:    General: Skin is warm and dry.     Capillary Refill: Capillary refill takes less than 2 seconds.  Neurological:     Mental Status: She is alert and oriented to person, place, and time.  Psychiatric:        Mood and Affect: Mood and affect normal.        Behavior: Behavior normal.      Musculoskeletal Exam: C-spine thoracic and lumbar spine with good range of motion.  Shoulder joints, elbow joints, wrist joints, MCPs PIPs and DIPs with good range of motion with no synovitis.  Hip joints, knee joints, ankles, MTPs and  PIPs with good range of motion with no synovitis.  CDAI Exam: CDAI Score: -- Patient Global: --; Provider Global: -- Swollen: --; Tender: -- Joint Exam 11/08/2020   No joint exam has been documented for this visit   There is currently no information documented on the homunculus. Go to the Rheumatology activity and complete the homunculus joint exam.  Investigation: No additional findings.  Imaging: No results found.  Recent Labs: Lab Results  Component Value Date   WBC 5.7 05/28/2020   HGB 12.8 05/28/2020   PLT 236.0 05/28/2020   NA 139 05/28/2020   K 4.1 05/28/2020   CL 104 05/28/2020   CO2 30 05/28/2020   GLUCOSE 114 (H) 05/28/2020   BUN 17 05/28/2020   CREATININE 0.89 05/28/2020   BILITOT 0.7 05/28/2020   ALKPHOS 54 05/28/2020   AST 23 05/28/2020   ALT 16 05/28/2020   PROT 7.5 05/28/2020   ALBUMIN 4.6 05/28/2020   CALCIUM 9.7 05/28/2020   GFRAA >60 01/25/2016    Speciality Comments: No specialty comments available.  Procedures:  No  procedures performed Allergies: Tramadol hcl   Assessment / Plan:     Visit Diagnoses: Positive ANA (antinuclear antibody) - 05/28/20: ANA 1:40NS, ESR 4 -patient has low titer positive ANA which is nonsignificant.  She has no clinical features of autoimmune disease.  There is no family history of autoimmune disease.  I will obtain the following labs to complete the work-up.  Plan: ANA, Anti-scleroderma antibody, RNP Antibody, Anti-Smith antibody, Sjogrens syndrome-A extractable nuclear antibody, Sjogrens syndrome-B extractable nuclear antibody, Anti-DNA antibody, double-stranded, C3 and C4  Rash-she developed a rash on her face in July.  It was treated with topical agents by Dr. Lowne.  Patient states the rash resolved after using the topical agents with no recurrence.  Essential hypertension-blood pressure is elevated today.  Have advised her to monitor blood pressure closely and follow-up with her PCP.  History of gastroesophageal  reflux (GERD)  History of shingles  History of hyperlipidemia  Other fatigue  RLS (restless legs syndrome)  Language barrier-an interpreter was present during the conversation.  Orders: Orders Placed This Encounter  Procedures  . ANA  . Anti-scleroderma antibody  . RNP Antibody  . Anti-Smith antibody  . Sjogrens syndrome-A extractable nuclear antibody  . Sjogrens syndrome-B extractable nuclear antibody  . Anti-DNA antibody, double-stranded  . C3 and C4   No orders of the defined types were placed in this encounter.     Follow-Up Instructions: Return for +ANA.   Shaili Deveshwar, MD  Note - This record has been created using Dragon software.  Chart creation errors have been sought, but may not always  have been located. Such creation errors [place] not reflect on  the standard of medical care.  

## 2020-11-08 ENCOUNTER — Other Ambulatory Visit: Payer: Self-pay

## 2020-11-08 ENCOUNTER — Encounter: Payer: Self-pay | Admitting: Rheumatology

## 2020-11-08 ENCOUNTER — Ambulatory Visit: Payer: Medicare Other | Admitting: Rheumatology

## 2020-11-08 ENCOUNTER — Telehealth: Payer: Self-pay

## 2020-11-08 VITALS — BP 173/80 | HR 73 | Resp 13 | Ht 60.0 in | Wt 114.2 lb

## 2020-11-08 DIAGNOSIS — R21 Rash and other nonspecific skin eruption: Secondary | ICD-10-CM

## 2020-11-08 DIAGNOSIS — Z8719 Personal history of other diseases of the digestive system: Secondary | ICD-10-CM

## 2020-11-08 DIAGNOSIS — I10 Essential (primary) hypertension: Secondary | ICD-10-CM

## 2020-11-08 DIAGNOSIS — Z8639 Personal history of other endocrine, nutritional and metabolic disease: Secondary | ICD-10-CM

## 2020-11-08 DIAGNOSIS — R768 Other specified abnormal immunological findings in serum: Secondary | ICD-10-CM | POA: Diagnosis not present

## 2020-11-08 DIAGNOSIS — Z789 Other specified health status: Secondary | ICD-10-CM | POA: Diagnosis not present

## 2020-11-08 DIAGNOSIS — R5383 Other fatigue: Secondary | ICD-10-CM | POA: Diagnosis not present

## 2020-11-08 DIAGNOSIS — Z603 Acculturation difficulty: Secondary | ICD-10-CM

## 2020-11-08 DIAGNOSIS — Z8619 Personal history of other infectious and parasitic diseases: Secondary | ICD-10-CM | POA: Diagnosis not present

## 2020-11-08 DIAGNOSIS — G2581 Restless legs syndrome: Secondary | ICD-10-CM

## 2020-11-08 NOTE — Telephone Encounter (Signed)
Returned call to Ariel Williams and advised her that per Dr. Corliss Skains patient should monitor her blood pressure and follow up with PCP. Advised we will not be sending in prescription for her elevated blood pressure. She asked about the patient's lab work. Advised it will take a few days for it to come back and will be discussed at the follow up visit on 11/29/2020. Ariel Williams to attend appointment, okay per Dr. Corliss Skains so she may also get answers to her questions.

## 2020-11-08 NOTE — Telephone Encounter (Signed)
Victorino Dike, patient's daughter-in-law called requesting a return call regarding her mom's appointment this morning.  Victorino Dike states her mom was a little confused about her high blood pressure and if Dr. Corliss Skains would be sending a prescription to the pharmacy.

## 2020-11-11 LAB — ANTI-SCLERODERMA ANTIBODY: Scleroderma (Scl-70) (ENA) Antibody, IgG: <1 AI

## 2020-11-11 LAB — C3 AND C4
C3 Complement: 151 mg/dL (ref 83–193)
C4 Complement: 38 mg/dL (ref 15–57)

## 2020-11-11 LAB — ANA: Anti Nuclear Antibody (ANA): NEGATIVE

## 2020-11-11 LAB — ANTI-DNA ANTIBODY, DOUBLE-STRANDED: ds DNA Ab: <1 IU/mL

## 2020-11-11 LAB — RNP ANTIBODY: Ribonucleic Protein(ENA) Antibody, IgG: <1 AI

## 2020-11-11 LAB — ANTI-SMITH ANTIBODY: ENA SM Ab Ser-aCnc: <1 AI

## 2020-11-11 LAB — SJOGRENS SYNDROME-A EXTRACTABLE NUCLEAR ANTIBODY: SSA (Ro) (ENA) Antibody, IgG: <1 AI

## 2020-11-11 LAB — SJOGRENS SYNDROME-B EXTRACTABLE NUCLEAR ANTIBODY: SSB (La) (ENA) Antibody, IgG: <1 AI

## 2020-11-25 NOTE — Progress Notes (Deleted)
Office Visit Note  Patient: Ariel Williams             Date of Birth: October 20, 1941           MRN: [id number]             PCP: [name], [place] Referring: [name], * Visit Date: 12/04/2020 Occupation: @GUAROCC @  Subjective:  No chief complaint on file.   History of Present Illness: Ariel Williams is a 80 y.o. female ***   Activities of Daily Living:  Patient reports morning stiffness for *** {minute/hour:19697}.   Patient {ACTIONS;DENIES/REPORTS:21021675::"Denies"} nocturnal pain.  Difficulty dressing/grooming: {ACTIONS;DENIES/REPORTS:21021675::"Denies"} Difficulty climbing stairs: {ACTIONS;DENIES/REPORTS:21021675::"Denies"} Difficulty getting out of chair: {ACTIONS;DENIES/REPORTS:21021675::"Denies"} Difficulty using hands for taps, buttons, cutlery, and/or writing: {ACTIONS;DENIES/REPORTS:21021675::"Denies"}  No Rheumatology ROS completed.   PMFS History:  Patient Active Problem List   Diagnosis Date Noted  . Numbness and tingling in left arm 01/09/2020  . Rash 09/27/2018  . Hyperlipidemia LDL goal <100 09/06/2018  . Essential hypertension 08/16/2018  . Hives 08/16/2018  . Preventative health care 08/11/2017  . Fatigue 09/11/2015  . SINUSITIS - ACUTE-NOS 12/10/2010  . CONTACT DERMATITIS&OTH ECZEMA DUE OTH [place] AGENT 10/07/2010  . NECK SPRAIN AND STRAIN 10/07/2010  . RESTLESS LEG SYNDROME 11/19/2009  . 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 GERD 11/23/2008  . DISUSE OSTEOPOROSIS 11/23/2008  . UNSPECIFIED OTITIS MEDIA 11/09/2008  . DYSPHAGIA UNSPECIFIED 11/09/2008  . SHINGLES, HX OF 01/20/2008  . UNS ADVRS EFF UNS RX MEDICINAL&BIOLOGICAL SBSTNC 01/17/2008  . POSTMENOPAUSAL STATUS 07/26/2007  . Chest pain, unspecified 07/26/2007    Past Medical History:  Diagnosis Date  . Arthritis   . H. pylori infection     Family History  Problem Relation Age of Onset  . Asthma Brother    Past Surgical  History:  Procedure Laterality Date  . TUBAL LIGATION     Social History   Social History Narrative   From [name] been 54 years lives with husband   Immunization History  Administered Date(s) Administered  . Fluad Quad(high Dose 65+) 07/20/2019  . Influenza Split 08/05/2012  . Influenza Whole 11/19/2009, 07/17/2010, 07/19/2011  . Influenza, High Dose Seasonal PF 08/11/2017, 08/16/2018  . Influenza,inj,Quad PF,6+ Mos 11/18/2013, 11/22/2015  . PFIZER(Purple Top)SARS-COV-2 Vaccination 12/09/2019, 01/03/2020  . Pneumococcal Conjugate-13 08/11/2017  . Pneumococcal Polysaccharide-23 07/26/2007     Objective: Vital Signs: There were no vitals taken for this visit.   Physical Exam   Musculoskeletal Exam: ***  CDAI Exam: CDAI Score: -- Patient Global: --; Provider Global: -- Swollen: --; Tender: -- Joint Exam 12/04/2020   No joint exam has been documented for this visit   There is currently no information documented on the homunculus. Go to the Rheumatology activity and complete the homunculus joint exam.  Investigation: No additional findings.  Imaging: No results found.  Recent Labs: Lab Results  Component Value Date   WBC 5.7 05/28/2020   HGB 12.8 05/28/2020   PLT 236.0 05/28/2020   NA 139 05/28/2020   K 4.1 05/28/2020   CL 104 05/28/2020   CO2 30 05/28/2020   GLUCOSE 114 (H) 05/28/2020   BUN 17 05/28/2020   CREATININE 0.89 05/28/2020   BILITOT 0.7 05/28/2020   ALKPHOS 54 05/28/2020   AST 23 05/28/2020   ALT 16 05/28/2020   PROT 7.5 05/28/2020   ALBUMIN 4.6 05/28/2020   CALCIUM 9.7 05/28/2020   GFRAA >60 01/25/2016   November 08, 2020 ANA negative, ENA negative, C3-C4 normal  Speciality Comments: No specialty comments available.  Procedures:  No procedures performed Allergies: Tramadol hcl   Assessment / Plan:     Visit Diagnoses: No diagnosis found.  Orders: No orders of the defined types were placed in this encounter.  No orders of the  defined types were placed in this encounter.   Face-to-face time spent with patient was *** minutes. Greater than 50% of time was spent in counseling and coordination of care.  Follow-Up Instructions: No follow-ups on file.   Bo Merino, MD  Note - This record has been created using Editor, commissioning.  Chart creation errors have been sought, but may not always  have been located. Such creation errors [place] not reflect on  the standard of medical care.

## 2020-11-29 ENCOUNTER — Ambulatory Visit: Payer: Medicare Other | Admitting: Rheumatology

## 2020-12-04 ENCOUNTER — Ambulatory Visit: Payer: Medicare Other | Admitting: Rheumatology

## 2020-12-04 DIAGNOSIS — G2581 Restless legs syndrome: Secondary | ICD-10-CM

## 2020-12-04 DIAGNOSIS — Z8639 Personal history of other endocrine, nutritional and metabolic disease: Secondary | ICD-10-CM

## 2020-12-04 DIAGNOSIS — Z8719 Personal history of other diseases of the digestive system: Secondary | ICD-10-CM

## 2020-12-04 DIAGNOSIS — Z8619 Personal history of other infectious and parasitic diseases: Secondary | ICD-10-CM

## 2020-12-04 DIAGNOSIS — Z789 Other specified health status: Secondary | ICD-10-CM

## 2020-12-04 DIAGNOSIS — R21 Rash and other nonspecific skin eruption: Secondary | ICD-10-CM

## 2020-12-04 DIAGNOSIS — R768 Other specified abnormal immunological findings in serum: Secondary | ICD-10-CM

## 2020-12-04 DIAGNOSIS — I10 Essential (primary) hypertension: Secondary | ICD-10-CM

## 2020-12-30 NOTE — Progress Notes (Signed)
Office Visit Note  Patient: Ariel Williams             Date of Birth: 09-23-1941           MRN: [id number]             PCP: [name], [place] Referring: [name], * Visit Date: 01/04/2021 Occupation: @GUAROCC @  Interpreter: Raynaldo Opitz  Subjective:  History of positive ANA.   History of Present Illness: Ariel Williams is a 80 y.o. female who was initially evaluated for rash and positive ANA.  Patient states the rash resolved after her last visit and had no recurrence of rash.  She denies any history of oral ulcers, nasal ulcers, malar rash, photosensitivity, Raynaud's phenomenon, inflammatory arthritis or lymphadenopathy.  She states she has occasional discomfort in her knee joints which comes and goes.  It does not bother her on a regular basis.  Activities of Daily Living:  Patient reports morning stiffness for 30  minutes.   Patient Reports nocturnal pain.  Difficulty dressing/grooming: Denies Difficulty climbing stairs: Denies Difficulty getting out of chair: Denies Difficulty using hands for taps, buttons, cutlery, and/or writing: Denies  Review of Systems  Constitutional: Positive for fatigue. Negative for night sweats, weight gain and weight loss.  HENT: Positive for mouth dryness. Negative for mouth sores, trouble swallowing, trouble swallowing and nose dryness.   Eyes: Positive for dryness. Negative for pain, redness, itching and visual disturbance.  Respiratory: Negative for cough, shortness of breath and difficulty breathing.   Cardiovascular: Negative for chest pain, palpitations, hypertension, irregular heartbeat and swelling in legs/feet.  Gastrointestinal: Negative for blood in stool, constipation and diarrhea.  Endocrine: Negative for increased urination.  Genitourinary: Negative for difficulty urinating and vaginal dryness.  Musculoskeletal: Positive for arthralgias, joint pain and morning stiffness. Negative for joint swelling, myalgias, muscle  weakness, muscle tenderness and myalgias.  Skin: Negative for color change, rash, hair loss, redness, skin tightness, ulcers and sensitivity to sunlight.  Allergic/Immunologic: Negative for susceptible to infections.  Neurological: Positive for parasthesias. Negative for dizziness, numbness, headaches, memory loss, night sweats and weakness.  Hematological: Negative for bruising/bleeding tendency and swollen glands.  Psychiatric/Behavioral: Positive for sleep disturbance. Negative for depressed mood and confusion. The patient is not nervous/anxious.     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 pylori infection     Family History  Problem Relation Age of Onset   Asthma Brother    Past Surgical History:  Procedure Laterality Date   TUBAL LIGATION     Social History   Social History Narrative   From  [name] been 65 years lives with husband   Immunization History  Administered Date(s) Administered   Fluad Quad(high Dose 65+) 07/20/2019   Influenza Split 08/05/2012   Influenza Whole 11/19/2009,  07/17/2010, 07/19/2011   Influenza, High Dose Seasonal PF 08/11/2017, 08/16/2018   Influenza,inj,Quad PF,6+ Mos 11/18/2013, 11/22/2015   PFIZER(Purple Top)SARS-COV-2 Vaccination 12/09/2019, 01/03/2020   Pneumococcal Conjugate-13 08/11/2017   Pneumococcal Polysaccharide-23 07/26/2007     Objective: Vital Signs: BP (!) 178/95 (BP Location: Left Arm, Patient Position: Sitting, Cuff Size: Normal)    Pulse 76    Resp 13    Ht 5' (1.524 m)    Wt 132 lb (59.9 kg)    BMI 25.78 kg/m    Physical Exam Vitals and nursing note reviewed.  Constitutional:      Appearance: She is well-developed and well-nourished.  HENT:     Head: Normocephalic and atraumatic.  Eyes:     Extraocular Movements: EOM normal.     Conjunctiva/sclera: Conjunctivae normal.  Cardiovascular:     Rate and Rhythm: Normal rate and regular rhythm.     Pulses: Intact distal pulses.     Heart sounds: Normal heart sounds.  Pulmonary:     Effort: Pulmonary effort is normal.     Breath sounds: Normal breath sounds.  Abdominal:     General: Bowel sounds are normal.     Palpations: Abdomen is soft.  Musculoskeletal:     Cervical back: Normal range of motion.  Lymphadenopathy:     Cervical: No cervical adenopathy.  Skin:    General: Skin is warm and dry.     Capillary Refill: Capillary refill takes less than 2 seconds.  Neurological:     Mental Status: She is alert and oriented to person, place, and time.  Psychiatric:        Mood and Affect: Mood and affect normal.        Behavior: Behavior normal.      Musculoskeletal Exam: C-spine thoracic and lumbar spine were in good range of motion.  Shoulder joints, elbow joints, wrist joints, MCPs PIPs and DIPs with good range of motion with no synovitis.  Hip joints, knee joints, ankles, MTPs and PIPs with good range of motion with no synovitis.  CDAI Exam: CDAI Score: -- Patient Global: --; Provider Global: -- Swollen: --; Tender: -- Joint Exam 01/04/2021   No joint  exam has been documented for this visit   There is currently no information documented on the homunculus. Go to the Rheumatology activity and complete the homunculus joint exam.  Investigation: No additional findings.  Imaging: No results found.  Recent Labs: Lab Results  Component Value Date   WBC 5.7 05/28/2020   HGB 12.8 05/28/2020   PLT 236.0 05/28/2020   NA 139 05/28/2020   K 4.1 05/28/2020   CL 104 05/28/2020   CO2 30 05/28/2020   GLUCOSE 114 (H) 05/28/2020   BUN 17 05/28/2020   CREATININE 0.89 05/28/2020   BILITOT 0.7 05/28/2020   ALKPHOS 54 05/28/2020   AST 23 05/28/2020   ALT 16 05/28/2020   PROT 7.5 05/28/2020   ALBUMIN 4.6 05/28/2020   CALCIUM 9.7 05/28/2020   GFRAA >60 01/25/2016   April 08, 2021 ANA negative, ENA negative, C3-C4 normal  Speciality Comments: No specialty comments available.  Procedures:  No procedures performed Allergies: Tramadol hcl   Assessment / Plan:     Visit Diagnoses: Positive ANA (antinuclear antibody) - Repeat ANA negative, ENA negative, complements normal.  Lab findings were  discussed with the patient through the interpreter.  Patient has no history of oral ulcers, nasal ulcers, malar rash, photosensitivity, Raynaud's phenomenon, inflammatory arthritis or lymphadenopathy.  She gives history of infrequent discomfort in her knee joints.  No warmth swelling effusion was noted.  She was advised to come back if she develops any new symptoms.  Knee joint discomfort-patient states that she has infrequent discomfort in her knee joints and it is not bothersome.  No warmth swelling or effusion was noted.  Rash - Rash resolved after treating with topical agents.  Essential hypertension-blood pressure is elevated today.  I advised her to monitor blood pressure closely and follow-up with her PCP.  History of hyperlipidemia  History of gastroesophageal reflux (GERD)  RLS (restless legs syndrome)  Language barrier  History of  shingles  Orders: No orders of the defined types were placed in this encounter.  No orders of the defined types were placed in this encounter.     Follow-Up Instructions: Return if symptoms worsen or fail to improve.   Bo Merino, MD  Note - This record has been created using Editor, commissioning.  Chart creation errors have been sought, but may not always  have been located. Such creation errors [place] not reflect on  the standard of medical care.

## 2021-01-04 ENCOUNTER — Ambulatory Visit: Payer: Medicare Other | Admitting: Family Medicine

## 2021-01-04 ENCOUNTER — Encounter: Payer: Self-pay | Admitting: Family Medicine

## 2021-01-04 ENCOUNTER — Encounter: Payer: Self-pay | Admitting: Rheumatology

## 2021-01-04 ENCOUNTER — Ambulatory Visit: Payer: Medicare Other | Admitting: Rheumatology

## 2021-01-04 ENCOUNTER — Other Ambulatory Visit: Payer: Self-pay

## 2021-01-04 VITALS — BP 178/95 | HR 76 | Resp 13 | Ht 60.0 in | Wt 132.0 lb

## 2021-01-04 VITALS — BP 142/80 | HR 84 | Temp 98.7°F | Resp 18 | Ht 60.0 in | Wt 133.2 lb

## 2021-01-04 DIAGNOSIS — I10 Essential (primary) hypertension: Secondary | ICD-10-CM

## 2021-01-04 DIAGNOSIS — Z8719 Personal history of other diseases of the digestive system: Secondary | ICD-10-CM | POA: Diagnosis not present

## 2021-01-04 DIAGNOSIS — Z789 Other specified health status: Secondary | ICD-10-CM | POA: Diagnosis not present

## 2021-01-04 DIAGNOSIS — G2581 Restless legs syndrome: Secondary | ICD-10-CM

## 2021-01-04 DIAGNOSIS — R768 Other specified abnormal immunological findings in serum: Secondary | ICD-10-CM

## 2021-01-04 DIAGNOSIS — Z8619 Personal history of other infectious and parasitic diseases: Secondary | ICD-10-CM | POA: Diagnosis not present

## 2021-01-04 DIAGNOSIS — Z8639 Personal history of other endocrine, nutritional and metabolic disease: Secondary | ICD-10-CM | POA: Diagnosis not present

## 2021-01-04 DIAGNOSIS — R21 Rash and other nonspecific skin eruption: Secondary | ICD-10-CM | POA: Diagnosis not present

## 2021-01-04 MED ORDER — LISINOPRIL-HYDROCHLOROTHIAZIDE 10-12.5 MG PO TABS: 1.0000 | ORAL_TABLET | Freq: Every day | ORAL | 1 refills | Status: DC

## 2021-01-04 NOTE — Assessment & Plan Note (Signed)
Poorly controlled will alter medications, encouraged DASH diet, minimize caffeine and obtain adequate sleep. Report concerning symptoms and follow up as directed and as needed 

## 2021-01-04 NOTE — Progress Notes (Signed)
Patient ID: Ariel Williams, female    DOB: Mar 04, 1941  Age: 80 y.o. MRN: [id number]    Subjective:  Subjective  HPI Ariel Williams presents for office visit. She was seen by a doctor today and was recommended to come here secondary to her high blood pressure reading. Her BP was 142/80 during office visit today. She denies taking any bp medication at this time because it was never refilled. However, she remembers visiting [name] a few months ago and once returning she stopped taking bp medications. She was taking 25 mg of Lisinopril daily to manage her hypertenision. She denies any chest pain, back pain, abdominal pain, headache, N/V/D, fever, cough, chills, SOB, dysuria, vaginal pain.   Review of Systems  Constitutional: Negative for activity change, appetite change, chills, fatigue and unexpected weight change.  HENT: Negative for ear pain, sinus pressure and sinus pain.   Respiratory: Negative for cough, chest tightness and shortness of breath.   Cardiovascular: Negative for chest pain and palpitations.  Gastrointestinal: Negative for abdominal pain, diarrhea, nausea and vomiting.  Genitourinary: Negative for dysuria, flank pain, hematuria and vaginal pain.  Musculoskeletal: Negative for arthralgias, back pain and myalgias.  Skin: Negative for rash.  Neurological: Negative for dizziness and headaches.  Psychiatric/Behavioral: Negative for behavioral problems and dysphoric mood. The patient is not nervous/anxious.     History Past Medical History:  Diagnosis Date  . Arthritis   . H. pylori infection     She has a past surgical history that includes Tubal ligation.   Her family history includes Asthma in her brother.She reports that she has never smoked. She has never used smokeless tobacco. She reports that she does not drink alcohol and does not use drugs.  Current Outpatient Medications on File Prior to Visit  Medication Sig Dispense Refill  . Multiple Vitamins-Minerals (WOMENS  MULTIVITAMIN PO) Take 1 tablet by mouth daily.    . NONFORMULARY OR COMPOUNDED ITEM bp cuff  #1  Dx htn 1 each 0  . mometasone (ELOCON) 0.1 % cream APPLY TOPICALLY TO THE AFFECTED AREA DAILY (Patient not taking: No sig reported) 45 g 2  . sertraline (ZOLOFT) 25 MG tablet Take 1 tablet (25 mg total) by mouth daily. (Patient not taking: No sig reported) 30 tablet 2  . triamcinolone cream (KENALOG) 0.1 % Apply 1 application topically 2 (two) times daily. (Patient not taking: No sig reported) 454 g 2   No current facility-administered medications on file prior to visit.     Objective:  Objective  Physical Exam Constitutional:      Appearance: She is well-developed and well-nourished.  HENT:     Head: Normocephalic and atraumatic.  Eyes:     Extraocular Movements: EOM normal.     Conjunctiva/sclera: Conjunctivae normal.  Neck:     Thyroid: No thyromegaly.     Vascular: No carotid bruit or JVD.  Cardiovascular:     Rate and Rhythm: Normal rate and regular rhythm.     Heart sounds: Normal heart sounds. No murmur heard.   Pulmonary:     Effort: Pulmonary effort is normal. No respiratory distress.     Breath sounds: Normal breath sounds. No wheezing or rales.  Chest:     Chest wall: No tenderness.  Musculoskeletal:        General: No edema.     Cervical back: Normal range of motion and neck supple.  Neurological:     Mental Status: She is alert and oriented to person, place, and  time.  Psychiatric:        Mood and Affect: Mood and affect normal.    BP (!) 142/80 (BP Location: Right Arm, Patient Position: Sitting, Cuff Size: Normal)   Pulse 84   Temp 98.7 F (37.1 C) (Oral)   Resp 18   Ht 5' (1.524 m)   Wt 133 lb 3.2 oz (60.4 kg)   SpO2 96%   BMI 26.01 kg/m  Wt Readings from Last 3 Encounters:  01/04/21 133 lb 3.2 oz (60.4 kg)  01/04/21 132 lb (59.9 kg)  11/08/20 114 lb 3.2 oz (51.8 kg)     Lab Results  Component Value Date   WBC 5.7 05/28/2020   HGB 12.8 05/28/2020    HCT 37.8 05/28/2020   PLT 236.0 05/28/2020   GLUCOSE 114 (H) 05/28/2020   CHOL 202 (H) 01/09/2020   TRIG 235.0 (H) 01/09/2020   HDL 40.80 01/09/2020   LDLDIRECT 126.0 01/09/2020   LDLCALC 119 (H) 08/16/2018   ALT 16 05/28/2020   AST 23 05/28/2020   NA 139 05/28/2020   K 4.1 05/28/2020   CL 104 05/28/2020   CREATININE 0.89 05/28/2020   BUN 17 05/28/2020   CO2 30 05/28/2020   TSH 2.36 02/18/2016    DG Bone Density  Result Date: 01/10/2020 EXAM: DUAL X-RAY ABSORPTIOMETRY (DXA) FOR BONE MINERAL DENSITY IMPRESSION: Ariel Williams Your patient Ariel Williams completed a BMD test on 01/10/2020 using the [place] (analysis version: 16.SP2) manufactured by [employer]. The following summarizes the results of our evaluation. [facility] PATIENT: Name: Ariel, Williams Patient ID: [id number] Birth Date: [birth year] Height: 59.5 in. Gender: Female Measured: 01/10/2020 Weight: 128.0 lbs. Indications: Advanced Age, Estrogen Deficiency, History of Osteoporosis, Hysterectomy, Low Calcium Intake, Oophorectomy ( Bilateral), Post Menopausal, Vietnamese Fractures: Treatments: Multivitamin ASSESSMENT: The BMD measured at Femur Neck Right is 0.790 g/cm2 with a T-score of -1.8. This patient is considered osteopenic according to [facility]) criteria. Compared with the prior study on, 06/24/2016 the BMD of the total mean shows a statistically significant increase. The scan quality is good. Site Region Measured Date Measured Age WHO YA BMD Classification T-score AP Spine L1-L4 (L3) 01[age]10/2020 78.8 Osteopenia -1.4 1.003 g/cm2 AP Spine L1-L4 (L3) 06/24/2016 75.2 Osteopenia -1.7 0.970 g/cm2 DualFemur Neck Right 01/10/2020 78.8 Osteopenia -1.8 0.790 g/cm2 DualFemur Neck Right 06/24/2016 75.2 Osteopenia -1.5 0.823 g/cm2 DualFemur Total Mean 01/10/2020 78.8 Normal -1.0 0.876 g/cm2 DualFemur Total Mean 06/24/2016 75.2 Osteopenia -1.3 0.848 g/cm2 World Health Organization [facility]) criteria for post-menopausal,  Caucasian Women: Normal       T-score at or above -1 SD Osteopenia   T-score between -1 and -2.5 SD Osteoporosis T-score at or below -2.5 SD RECOMMENDATION:1. All patients should optimize calcium and vitamin D intake. 2. Consider FDA-approved medical therapies in postmenopausal women and men aged 64 years and older, based on the following: a. A hip or vertebral(clinical or morphometric) fracture. b. T-Score < -2.5 at the femoral neck or spine after appropriate evaluation to exclude secondary causes c. Low bone mass (T-score between -1.0 and -2.5 at the femoral neck or spine) and a 10 year probability of a hip fracture >3% or a 10 year probability of major osteoporosis-related fracture > 20% based on the [place]-adapted WHO algorithm d. Clinical judgement and/or patient preferences may indicate treatment for people with 10-year fracture probabilities above or below these levels FOLLOW-UP: Patients with diagnosis of osteoporosis or at high risk for fracture should have regular bone mineral  density tests. For patients eligible for Medicare, routine testing is allowed once every 2 years. The testing frequency can be increased to one year for patients who have rapidly progressing disease, those who are receiving or discontinuing medical therapy to restore bone mass, or have additional risk factors. I have reviewed this report, anf agree with the above findings. [facility] Radiology Patient: Rachel Bo Referring Physician: Rosalita Chessman CHASE Birth Date: Aug 30, 1941 [age] Patient ID: [id number] Height: 59.5 in. Weight: 128.0 lbs. Measured: 01/10/2020 8:30:48 AM (16 SP 4) Gender: Female Ethnicity: Asian Analyzed: 01/10/2020 8:35:04 AM (16 SP 4) FRAX* 10-year Probability of Fracture Based on femoral neck BMD: DualFemur (Right) Major Osteoporotic Fracture: 8.7% Hip Fracture:                2.3% Population:                  [place] (Asian) Risk Factors:                None *FRAX is a Materials engineer of the [employer] of  [employer] for Metabolic Bone Disease, a [place] (WHO) [facility]. ASSESSMENT: The probability of a major osteoporotic fracture is 8.7% within the next ten years. The probability of a hip fracture is 2.3% within the next ten years. Electronically Signed   By: Lowella Grip III M.D.   On: 01/10/2020 09:13   MM 3D SCREEN BREAST BILATERAL  Result Date: 01/10/2020 CLINICAL DATA:  Screening. EXAM: DIGITAL SCREENING BILATERAL MAMMOGRAM WITH TOMO AND CAD COMPARISON:  Previous exam(s). ACR Breast Density Category b: There are scattered areas of fibroglandular density. FINDINGS: There are no findings suspicious for malignancy. Images were processed with CAD. IMPRESSION: No mammographic evidence of malignancy. A result letter of this screening mammogram will be mailed directly to the patient. RECOMMENDATION: Screening mammogram in one year. (Code:SM-B-01Y) BI-RADS CATEGORY  1: Negative. Electronically Signed   By: Abelardo Diesel M.D.   On: 01/10/2020 13:25     Assessment & Plan:  Plan    Meds ordered this encounter  Medications  . lisinopril-hydrochlorothiazide (ZESTORETIC) 10-12.5 MG tablet    Sig: Take 1 tablet by mouth daily.    Dispense:  90 tablet    Refill:  1    Problem List Items Addressed This Visit      Unprioritized   Essential hypertension    Poorly controlled will alter medications, encouraged DASH diet, minimize caffeine and obtain adequate sleep. Report concerning symptoms and follow up as directed and as needed      Relevant Medications   lisinopril-hydrochlorothiazide (ZESTORETIC) 10-12.5 MG tablet    Other Visit Diagnoses    Primary hypertension    -  Primary   Relevant Medications   lisinopril-hydrochlorothiazide (ZESTORETIC) 10-12.5 MG tablet   Other Relevant Orders   Lipid panel   Comprehensive metabolic panel      Follow-up: Return in about 2 weeks (around 01/18/2021), or if symptoms worsen or fail to improve, for  hypertension.  I,Alexis Marshell Levan ,acting as a Education administrator for [employer], [place].,have documented all relevant documentation on the behalf of Ariel Williams, [place],as directed by  Ariel Williams, [place] while in the presence of [place], [place], have reviewed all documentation for this visit. The documentation on 01/04/21 for the exam, diagnosis, procedures, and orders are all accurate and complete.

## 2021-01-04 NOTE — Patient Instructions (Signed)

## 2021-01-05 LAB — COMPREHENSIVE METABOLIC PANEL
AG Ratio: 1.7 (calc) (ref 1.0–2.5)
ALT: 14 U/L (ref 6–29)
AST: 18 U/L (ref 10–35)
Albumin: 4.9 g/dL (ref 3.6–5.1)
Alkaline phosphatase (APISO): 58 U/L (ref 37–153)
BUN: 14 mg/dL (ref 7–25)
CO2: 27 mmol/L (ref 20–32)
Calcium: 10.3 mg/dL (ref 8.6–10.4)
Chloride: 102 mmol/L (ref 98–110)
Creat: 0.75 mg/dL (ref 0.60–0.93)
Globulin: 2.9 g/dL (calc) (ref 1.9–3.7)
Glucose, Bld: 95 mg/dL (ref 65–99)
Potassium: 3.8 mmol/L (ref 3.5–5.3)
Sodium: 141 mmol/L (ref 135–146)
Total Bilirubin: 0.7 mg/dL (ref 0.2–1.2)
Total Protein: 7.8 g/dL (ref 6.1–8.1)

## 2021-01-05 LAB — LIPID PANEL
Cholesterol: 234 mg/dL — ABNORMAL HIGH (ref <200)
HDL: 43 mg/dL — ABNORMAL LOW (ref >=50)
LDL Cholesterol (Calc): 142 mg/dL (calc) — ABNORMAL HIGH
Non-HDL Cholesterol (Calc): 191 mg/dL (calc) — ABNORMAL HIGH (ref <130)
Total CHOL/HDL Ratio: 5.4 (calc) — ABNORMAL HIGH (ref <5.0)
Triglycerides: 319 mg/dL — ABNORMAL HIGH (ref <150)

## 2021-01-06 ENCOUNTER — Other Ambulatory Visit: Payer: Self-pay | Admitting: Family Medicine

## 2021-01-06 DIAGNOSIS — E785 Hyperlipidemia, unspecified: Secondary | ICD-10-CM

## 2021-01-18 ENCOUNTER — Ambulatory Visit (INDEPENDENT_AMBULATORY_CARE_PROVIDER_SITE_OTHER): Payer: Medicare Other | Admitting: Family Medicine

## 2021-01-18 ENCOUNTER — Encounter: Payer: Self-pay | Admitting: Family Medicine

## 2021-01-18 ENCOUNTER — Other Ambulatory Visit: Payer: Self-pay

## 2021-01-18 VITALS — BP 138/78 | HR 76 | Resp 18 | Ht 60.0 in | Wt 131.8 lb

## 2021-01-18 DIAGNOSIS — I10 Essential (primary) hypertension: Secondary | ICD-10-CM

## 2021-01-18 DIAGNOSIS — E785 Hyperlipidemia, unspecified: Secondary | ICD-10-CM

## 2021-01-18 MED ORDER — ROSUVASTATIN CALCIUM 10 MG PO TABS: 10.0000 mg | ORAL_TABLET | Freq: Every day | ORAL | 3 refills | Status: DC

## 2021-01-18 NOTE — Assessment & Plan Note (Signed)
Well controlled, no changes to meds. Encouraged heart healthy diet such as the DASH diet and exercise as tolerated.  °

## 2021-01-18 NOTE — Assessment & Plan Note (Signed)
Encouraged heart healthy diet, increase exercise, avoid trans fats, consider a krill oil cap daily Start crestor and recheck 3 months Lab Results  Component Value Date   CHOL 234 (H) 01/04/2021   HDL 43 (L) 01/04/2021   LDLCALC 142 (H) 01/04/2021   LDLDIRECT 126.0 01/09/2020   TRIG 319 (H) 01/04/2021   CHOLHDL 5.4 (H) 01/04/2021

## 2021-01-18 NOTE — Progress Notes (Signed)
Patient ID: JASKIRAN PATA, female    DOB: Nov 18, 1940  Age: 80 y.o. MRN: [id number]    Subjective:  Subjective  HPI Ariel Williams presents for an office visit today. She reports feeling well.  She states that she has been taking 10-12.5 mg Zestoretic PO daily.  She only request for a refill on her Crestor medication secondary her hyperlipidemia.       Translator device used to help with translation  BP Readings from Last 3 Encounters:  01/18/21 138/78  01/04/21 (!) 142/80  01/04/21 (!) 178/95      She denies any SOB, HA, fever, N/V/D, cough, chest pain, abdominal pain, dysuria, chills, or vaginal discharge and pain at this time.   Review of Systems  Constitutional: Negative for chills, fatigue and fever.  HENT: Negative for ear pain, rhinorrhea, sinus pressure, sinus pain and sore throat.   Eyes: Negative for pain.  Respiratory: Negative for cough and shortness of breath.   Cardiovascular: Negative for chest pain.  Gastrointestinal: Negative for abdominal pain, diarrhea, nausea and vomiting.  Genitourinary: Negative for dysuria, frequency, hematuria, urgency, vaginal discharge and vaginal pain.  Musculoskeletal: Negative for back pain and myalgias.  Neurological: Negative for dizziness and headaches.    History Past Medical History:  Diagnosis Date  . Arthritis   . H. pylori infection     She has a past surgical history that includes Tubal ligation.   Her family history includes Asthma in her brother.She reports that she has never smoked. She has never used smokeless tobacco. She reports that she does not drink alcohol and does not use drugs.  Current Outpatient Medications on File Prior to Visit  Medication Sig Dispense Refill  . lisinopril-hydrochlorothiazide (ZESTORETIC) 10-12.5 MG tablet Take 1 tablet by mouth daily. 90 tablet 1  . Multiple Vitamins-Minerals (WOMENS MULTIVITAMIN PO) Take 1 tablet by mouth daily.    . NONFORMULARY OR COMPOUNDED ITEM bp cuff  #1  Dx htn 1 each 0    No current facility-administered medications on file prior to visit.     Objective:  Objective  Physical Exam Vitals and nursing note reviewed.  Constitutional:      General: She is not in acute distress.    Appearance: Normal appearance. She is well-developed. She is not ill-appearing.  HENT:     Head: Normocephalic and atraumatic.     Right Ear: External ear normal.     Left Ear: External ear normal.     Nose: Nose normal.  Eyes:     Extraocular Movements: Extraocular movements intact.     Conjunctiva/sclera: Conjunctivae normal.     Pupils: Pupils are equal, round, and reactive to light.  Cardiovascular:     Rate and Rhythm: Normal rate and regular rhythm.     Pulses: Normal pulses.     Heart sounds: Normal heart sounds. No murmur heard. No gallop.   Pulmonary:     Effort: Pulmonary effort is normal. No respiratory distress.     Breath sounds: Normal breath sounds. No wheezing, rhonchi or rales.  Abdominal:     General: Bowel sounds are normal. There is no distension.     Palpations: Abdomen is soft. There is no mass.     Tenderness: There is no abdominal tenderness. There is no guarding or rebound.  Musculoskeletal:     Cervical back: Normal range of motion and neck supple.  Skin:    General: Skin is warm and dry.  Neurological:     Mental Status:  She is alert and oriented to person, place, and time.  Psychiatric:        Behavior: Behavior normal.    BP 138/78 (BP Location: Left Arm, Patient Position: Sitting, Cuff Size: Normal)   Pulse 76   Resp 18   Ht 5' (1.524 m)   Wt 131 lb 12.8 oz (59.8 kg)   SpO2 97%   BMI 25.74 kg/m  Wt Readings from Last 3 Encounters:  01/18/21 131 lb 12.8 oz (59.8 kg)  01/04/21 133 lb 3.2 oz (60.4 kg)  01/04/21 132 lb (59.9 kg)     Lab Results  Component Value Date   WBC 5.7 05/28/2020   HGB 12.8 05/28/2020   HCT 37.8 05/28/2020   PLT 236.0 05/28/2020   GLUCOSE 95 01/04/2021   CHOL 234 (H) 01/04/2021   TRIG 319 (H)  01/04/2021   HDL 43 (L) 01/04/2021   LDLDIRECT 126.0 01/09/2020   LDLCALC 142 (H) 01/04/2021   ALT 14 01/04/2021   AST 18 01/04/2021   NA 141 01/04/2021   K 3.8 01/04/2021   CL 102 01/04/2021   CREATININE 0.75 01/04/2021   BUN 14 01/04/2021   CO2 27 01/04/2021   TSH 2.36 02/18/2016    DG Bone Density  Result Date: 01/10/2020 EXAM: DUAL X-RAY ABSORPTIOMETRY (DXA) FOR BONE MINERAL DENSITY IMPRESSION: Alferd Apa [place] CHASE Your patient Emanii Huron completed a BMD test on 01/10/2020 using the [place] (analysis version: 16.SP2) manufactured by [employer]. The following summarizes the results of our evaluation. [facility] PATIENT: Name: Quanisha, Drewry Patient ID: [id number] Birth Date: [birth year] Height: 59.5 in. Gender: Female Measured: 01/10/2020 Weight: 128.0 lbs. Indications: Advanced Age, Estrogen Deficiency, History of Osteoporosis, Hysterectomy, Low Calcium Intake, Oophorectomy ( Bilateral), Post Menopausal, Vietnamese Fractures: Treatments: Multivitamin ASSESSMENT: The BMD measured at Femur Neck Right is 0.790 g/cm2 with a T-score of -1.8. This patient is considered osteopenic according to [facility]) criteria. Compared with the prior study on, 06/24/2016 the BMD of the total mean shows a statistically significant increase. The scan quality is good. Site Region Measured Date Measured Age WHO YA BMD Classification T-score AP Spine L1-L4 (L3) 01/10/2020 78.8 Osteopenia -1.4 1.003 g/cm2 AP Spine L1-L4 (L3) 06/24/2016 75.2 Osteopenia -1.7 0.970 g/cm2 DualFemur Neck Right 01/10/2020 78.8 Osteopenia -1.8 0.790 g/cm2 DualFemur Neck Right 06/24/2016 75.2 Osteopenia -1.5 0.823 g/cm2 DualFemur Total Mean 01/10/2020 78.8 Normal -1.0 0.876 g/cm2 DualFemur Total Mean 06/24/2016 75.2 Osteopenia -1.3 0.848 g/cm2 World Health Organization [facility]) criteria for post-menopausal, Caucasian Women: Normal       T-score at or above -1 SD Osteopenia   T-score between -1 and -2.5 SD Osteoporosis T-score  at or below -2.5 SD RECOMMENDATION:1. All patients should optimize calcium and vitamin D intake. 2. Consider FDA-approved medical therapies in postmenopausal women and men aged 47 years and older, based on the following: a. A hip or vertebral(clinical or morphometric) fracture. b. T-Score < -2.5 at the femoral neck or spine after appropriate evaluation to exclude secondary causes c. Low bone mass (T-score between -1.0 and -2.5 at the femoral neck or spine) and a 10 year probability of a hip fracture >3% or a 10 year probability of major osteoporosis-related fracture > 20% based on the [place]-adapted WHO algorithm d. Clinical judgement and/or patient preferences may indicate treatment for people with 10-year fracture probabilities above or below these levels FOLLOW-UP: Patients with diagnosis of osteoporosis or at high risk for fracture should have regular bone mineral density tests. For patients  eligible for Medicare, routine testing is allowed once every 2 years. The testing frequency can be increased to one year for patients who have rapidly progressing disease, those who are receiving or discontinuing medical therapy to restore bone mass, or have additional risk factors. I have reviewed this report, anf agree with the above findings. [facility] Radiology Patient: Rachel Bo Referring Physician: Rosalita Chessman CHASE Birth Date: Aug 14, 1941 [age] Patient ID: [id number] Height: 59.5 in. Weight: 128.0 lbs. Measured: 01/10/2020 8:30:48 AM (16 SP 4) Gender: Female Ethnicity: Asian Analyzed: 01/10/2020 8:35:04 AM (16 SP 4) FRAX* 10-year Probability of Fracture Based on femoral neck BMD: DualFemur (Right) Major Osteoporotic Fracture: 8.7% Hip Fracture:                2.3% Population:                  [place] (Asian) Risk Factors:                None *FRAX is a Materials engineer of the [employer] for Metabolic Bone Disease, a [place] (WHO) [facility].  ASSESSMENT: The probability of a major osteoporotic fracture is 8.7% within the next ten years. The probability of a hip fracture is 2.3% within the next ten years. Electronically Signed   By: Lowella Grip III M.D.   On: 01/10/2020 09:13   MM 3D SCREEN BREAST BILATERAL  Result Date: 01/10/2020 CLINICAL DATA:  Screening. EXAM: DIGITAL SCREENING BILATERAL MAMMOGRAM WITH TOMO AND CAD COMPARISON:  Previous exam(s). ACR Breast Density Category b: There are scattered areas of fibroglandular density. FINDINGS: There are no findings suspicious for malignancy. Images were processed with CAD. IMPRESSION: No mammographic evidence of malignancy. A result letter of this screening mammogram will be mailed directly to the patient. RECOMMENDATION: Screening mammogram in one year. (Code:SM-B-01Y) BI-RADS CATEGORY  1: Negative. Electronically Signed   By: Abelardo Diesel M.D.   On: 01/10/2020 13:25     Assessment & Plan:  Plan    Meds ordered this encounter  Medications  . rosuvastatin (CRESTOR) 10 MG tablet    Sig: Take 1 tablet (10 mg total) by mouth daily.    Dispense:  90 tablet    Refill:  3    Problem List Items Addressed This Visit      Unprioritized   Essential hypertension    Well controlled, no changes to meds. Encouraged heart healthy diet such as the DASH diet and exercise as tolerated.        Relevant Medications   rosuvastatin (CRESTOR) 10 MG tablet   Hyperlipidemia LDL goal <100    Encouraged heart healthy diet, increase exercise, avoid trans fats, consider a krill oil cap daily Start crestor and recheck 3 months Lab Results  Component Value Date   CHOL 234 (H) 01/04/2021   HDL 43 (L) 01/04/2021   LDLCALC 142 (H) 01/04/2021   LDLDIRECT 126.0 01/09/2020   TRIG 319 (H) 01/04/2021   CHOLHDL 5.4 (H) 01/04/2021         Relevant Medications   rosuvastatin (CRESTOR) 10 MG tablet    Other Visit Diagnoses    Hyperlipidemia, unspecified hyperlipidemia type    -  Primary    Relevant Medications   rosuvastatin (CRESTOR) 10 MG tablet      Follow-up: Return in about 3 months (around 04/20/2021), or if symptoms worsen or fail to improve, for hypertension.   I,Alexis Bryant,acting as a  scribe for [name], [place].,have documented all relevant documentation on the behalf of [name], [place],as directed by  [name], [place] while in the presence of [place], [place], have reviewed all documentation for this visit. The documentation on 01/18/21 for the exam, diagnosis, procedures, and orders are all accurate and complete. [name], [place]

## 2021-01-18 NOTE — Patient Instructions (Signed)

## 2021-02-05 DIAGNOSIS — H40033 Anatomical narrow angle, bilateral: Secondary | ICD-10-CM | POA: Diagnosis not present

## 2021-02-05 DIAGNOSIS — H2513 Age-related nuclear cataract, bilateral: Secondary | ICD-10-CM | POA: Diagnosis not present

## 2021-03-25 ENCOUNTER — Other Ambulatory Visit: Payer: Self-pay

## 2021-03-25 ENCOUNTER — Ambulatory Visit (INDEPENDENT_AMBULATORY_CARE_PROVIDER_SITE_OTHER): Payer: Medicare Other | Admitting: *Deleted

## 2021-03-25 DIAGNOSIS — Z23 Encounter for immunization: Secondary | ICD-10-CM | POA: Diagnosis not present

## 2021-03-25 NOTE — Progress Notes (Signed)
   Covid-19 Vaccination Clinic  Name:  NATORIA ARCHIBALD    MRN: [id number] DOB: February 16, 1941  03/25/2021  Ms. Boehle was observed post Covid-19 immunization for 15 minutes without incident. She was provided with Vaccine Information Sheet and instruction to access the V-Safe system.   Ms. Arbuthnot was instructed to call 911 with any severe reactions post vaccine: Marland Kitchen Difficulty breathing  . Swelling of face and throat  . A fast heartbeat  . A bad rash all over body  . Dizziness and weakness     Landis Gandy, RN

## 2021-03-29 ENCOUNTER — Other Ambulatory Visit: Payer: Self-pay | Admitting: Family Medicine

## 2021-03-29 DIAGNOSIS — E785 Hyperlipidemia, unspecified: Secondary | ICD-10-CM

## 2021-03-29 DIAGNOSIS — I10 Essential (primary) hypertension: Secondary | ICD-10-CM

## 2021-04-22 ENCOUNTER — Other Ambulatory Visit: Payer: Self-pay

## 2021-04-22 ENCOUNTER — Ambulatory Visit (INDEPENDENT_AMBULATORY_CARE_PROVIDER_SITE_OTHER): Payer: Medicare Other | Admitting: Family Medicine

## 2021-04-22 ENCOUNTER — Encounter: Payer: Self-pay | Admitting: Family Medicine

## 2021-04-22 VITALS — BP 120/78 | HR 79 | Temp 97.8°F | Resp 18 | Ht 60.0 in | Wt 134.6 lb

## 2021-04-22 DIAGNOSIS — L853 Xerosis cutis: Secondary | ICD-10-CM

## 2021-04-22 DIAGNOSIS — I10 Essential (primary) hypertension: Secondary | ICD-10-CM | POA: Diagnosis not present

## 2021-04-22 DIAGNOSIS — E785 Hyperlipidemia, unspecified: Secondary | ICD-10-CM

## 2021-04-22 LAB — COMPREHENSIVE METABOLIC PANEL
ALT: 22 U/L (ref 0–35)
AST: 25 U/L (ref 0–37)
Albumin: 4.9 g/dL (ref 3.5–5.2)
Alkaline Phosphatase: 63 U/L (ref 39–117)
BUN: 27 mg/dL — ABNORMAL HIGH (ref 6–23)
CO2: 29 mEq/L (ref 19–32)
Calcium: 10 mg/dL (ref 8.4–10.5)
Chloride: 102 mEq/L (ref 96–112)
Creatinine, Ser: 0.86 mg/dL (ref 0.40–1.20)
GFR: 63.94 mL/min (ref >60.00)
Glucose, Bld: 100 mg/dL — ABNORMAL HIGH (ref 70–99)
Potassium: 4.6 mEq/L (ref 3.5–5.1)
Sodium: 139 mEq/L (ref 135–145)
Total Bilirubin: 1.1 mg/dL (ref 0.2–1.2)
Total Protein: 7.8 g/dL (ref 6.0–8.3)

## 2021-04-22 LAB — LIPID PANEL
Cholesterol: 141 mg/dL (ref 0–200)
HDL: 47.6 mg/dL (ref >39.00)
LDL Cholesterol: 67 mg/dL (ref 0–99)
NonHDL: 93.46
Total CHOL/HDL Ratio: 3
Triglycerides: 132 mg/dL (ref 0.0–149.0)
VLDL: 26.4 mg/dL (ref 0.0–40.0)

## 2021-04-22 MED ORDER — LISINOPRIL-HYDROCHLOROTHIAZIDE 10-12.5 MG PO TABS: 1.0000 | ORAL_TABLET | Freq: Every day | ORAL | 1 refills | Status: DC

## 2021-04-22 MED ORDER — AMMONIUM LACTATE 12 % EX CREA: TOPICAL_CREAM | CUTANEOUS | 0 refills | Status: AC | PRN

## 2021-04-22 NOTE — Assessment & Plan Note (Signed)
Well controlled, no changes to meds. Encouraged heart healthy diet such as the DASH diet and exercise as tolerated.  °

## 2021-04-22 NOTE — Assessment & Plan Note (Signed)
Encourage heart healthy diet such as MIND or DASH diet, increase exercise, avoid trans fats, simple carbohydrates and processed foods, consider a krill or fish or flaxseed oil cap daily.  °

## 2021-04-22 NOTE — Progress Notes (Signed)
Patient ID: Ariel Williams, female    DOB: [birth year]  Age: 80 y.o. MRN: [id number]    Subjective:  Subjective  HPI Ariel Williams presents for an office visit today with an Technical brewer. She complains of intermittent itchy skin. She notes that her entire body itchiness (arms, abdomen, and legs). She notes that she only use moisturizer on her hand. She reports that her skin is usually dry. She states that her symptoms relieved when lotions is applied.  She endorses taking 10-12.5 mg of ZESTORETIC PO Daily for her dx of HTN.  BP Readings from Last 3 Encounters:  04/22/21 120/78  01/18/21 138/78  01/04/21 (!) 142/80  She denies any chest pain, SOB, fever, abdominal pain, cough, chills, sore throat, dysuria, urinary incontinence, back pain, HA, or N/V/D at this time.   Review of Systems  Constitutional:  Negative for chills, fatigue and fever.  HENT:  Negative for ear pain, rhinorrhea, sinus pressure, sinus pain, sore throat and tinnitus.   Eyes:  Negative for pain.  Respiratory:  Negative for cough, shortness of breath and wheezing.   Cardiovascular:  Negative for chest pain.  Gastrointestinal:  Negative for abdominal pain, anal bleeding, constipation, diarrhea, nausea and vomiting.  Genitourinary:  Negative for flank pain.  Musculoskeletal:  Negative for back pain and neck pain.  Skin:  Negative for rash.       (+) itchy skin    Neurological:  Negative for seizures, weakness, light-headedness, numbness and headaches.   History Past Medical History:  Diagnosis Date   Arthritis    H. pylori infection     She has a past surgical history that includes Tubal ligation.   Her family history includes Asthma in her brother.She reports that she has never smoked. She has never used smokeless tobacco. She reports that she does not drink alcohol and does not use drugs.  Current Outpatient Medications on File Prior to Visit  Medication Sig Dispense Refill   Multiple Vitamins-Minerals (WOMENS  MULTIVITAMIN PO) Take 1 tablet by mouth daily.     NONFORMULARY OR COMPOUNDED ITEM bp cuff  #1  Dx htn 1 each 0   rosuvastatin (CRESTOR) 10 MG tablet Take 1 tablet (10 mg total) by mouth daily. 90 tablet 3   No current facility-administered medications on file prior to visit.     Objective:  Objective  Physical Exam Vitals and nursing note reviewed.  Constitutional:      General: She is not in acute distress.    Appearance: Normal appearance. She is well-developed. She is not ill-appearing.  HENT:     Head: Normocephalic and atraumatic.     Right Ear: External ear normal.     Left Ear: External ear normal.     Nose: Nose normal.  Eyes:     General:        Right eye: No discharge.        Left eye: No discharge.     Extraocular Movements: Extraocular movements intact.     Pupils: Pupils are equal, round, and reactive to light.  Cardiovascular:     Rate and Rhythm: Normal rate and regular rhythm.     Pulses: Normal pulses.     Heart sounds: Normal heart sounds. No murmur heard.   No friction rub. No gallop.  Pulmonary:     Effort: Pulmonary effort is normal. No respiratory distress.     Breath sounds: Normal breath sounds. No stridor. No wheezing, rhonchi or rales.  Chest:  Chest wall: No tenderness.  Abdominal:     General: Bowel sounds are normal. There is no distension.     Palpations: Abdomen is soft. There is no mass.     Tenderness: There is no abdominal tenderness. There is no guarding or rebound.     Hernia: No hernia is present.  Musculoskeletal:        General: Normal range of motion.     Cervical back: Normal range of motion and neck supple.     Right lower leg: No edema.     Left lower leg: No edema.  Skin:    General: Skin is warm and dry.     Comments: There are dry patches of skin present in the thoracic spine.   Neurological:     Mental Status: She is alert and oriented to person, place, and time.  Psychiatric:        Behavior: Behavior normal.         Thought Content: Thought content normal.   BP 120/78 (BP Location: Left Arm, Patient Position: Sitting, Cuff Size: Normal)   Pulse 79   Temp 97.8 F (36.6 C) (Oral)   Resp 18   Ht 5' (1.524 m)   Wt 134 lb 9.6 oz (61.1 kg)   SpO2 98%   BMI 26.29 kg/m  Wt Readings from Last 3 Encounters:  04/22/21 134 lb 9.6 oz (61.1 kg)  01/18/21 131 lb 12.8 oz (59.8 kg)  01/04/21 133 lb 3.2 oz (60.4 kg)     Lab Results  Component Value Date   WBC 5.7 05/28/2020   HGB 12.8 05/28/2020   HCT 37.8 05/28/2020   PLT 236.0 05/28/2020   GLUCOSE 95 01/04/2021   CHOL 234 (H) 01/04/2021   TRIG 319 (H) 01/04/2021   HDL 43 (L) 01/04/2021   LDLDIRECT 126.0 01/09/2020   LDLCALC 142 (H) 01/04/2021   ALT 14 01/04/2021   AST 18 01/04/2021   NA 141 01/04/2021   K 3.8 01/04/2021   CL 102 01/04/2021   CREATININE 0.75 01/04/2021   BUN 14 01/04/2021   CO2 27 01/04/2021   TSH 2.36 02/18/2016    DG Bone Density  Result Date: 01/10/2020 EXAM: DUAL X-RAY ABSORPTIOMETRY (DXA) FOR BONE MINERAL DENSITY IMPRESSION: Ariel Williams Ariel Williams Your patient Ariel Williams completed a BMD test on 01/10/2020 using the [place] (analysis version: 16.SP2) manufactured by [employer]. The following summarizes the results of our evaluation. [facility] PATIENT: Name: Ariel, Williams Patient ID: [id number] Birth Date: 16-Oct-1941 Height: 59.5 in. Gender: Female Measured: 01/10/2020 Weight: 128.0 lbs. Indications: Advanced Age, Estrogen Deficiency, History of Osteoporosis, Hysterectomy, Low Calcium Intake, Oophorectomy ( Bilateral), Post Menopausal, Vietnamese Fractures: Treatments: Multivitamin ASSESSMENT: The BMD measured at Femur Neck Right is 0.790 g/cm2 with a T-score of -1.8. This patient is considered osteopenic according to [facility]) criteria. Compared with the prior study on, 06/24/2016 the BMD of the total mean shows a statistically significant increase. The scan quality is good. Site Region Measured Date  Measured Age WHO YA BMD Classification T-score AP Spine L1-L4 (L3) 01[age]10/2020 78.8 Osteopenia -1.4 1.003 g/cm2 AP Spine L1-L4 (L3) 06/24/2016 75.2 Osteopenia -1.7 0.970 g/cm2 DualFemur Neck Right 01/10/2020 78.8 Osteopenia -1.8 0.790 g/cm2 DualFemur Neck Right 06/24/2016 75.2 Osteopenia -1.5 0.823 g/cm2 DualFemur Total Mean 01/10/2020 78.8 Normal -1.0 0.876 g/cm2 DualFemur Total Mean 06/24/2016 75.2 Osteopenia -1.3 0.848 g/cm2 World Health Organization [facility]) criteria for post-menopausal, Caucasian Women: Normal       T-score  at or above -1 SD Osteopenia   T-score between -1 and -2.5 SD Osteoporosis T-score at or below -2.5 SD RECOMMENDATION:1. All patients should optimize calcium and vitamin D intake. 2. Consider FDA-approved medical therapies in postmenopausal women and men aged 88 years and older, based on the following: a. A hip or vertebral(clinical or morphometric) fracture. b. T-Score < -2.5 at the femoral neck or spine after appropriate evaluation to exclude secondary causes c. Low bone mass (T-score between -1.0 and -2.5 at the femoral neck or spine) and a 10 year probability of a hip fracture >3% or a 10 year probability of major osteoporosis-related fracture > 20% based on the [place]-adapted WHO algorithm d. Clinical judgement and/or patient preferences may indicate treatment for people with 10-year fracture probabilities above or below these levels FOLLOW-UP: Patients with diagnosis of osteoporosis or at high risk for fracture should have regular bone mineral density tests. For patients eligible for Medicare, routine testing is allowed once every 2 years. The testing frequency can be increased to one year for patients who have rapidly progressing disease, those who are receiving or discontinuing medical therapy to restore bone mass, or have additional risk factors. I have reviewed this report, anf agree with the above findings. [facility] Radiology Patient: Rachel Bo Referring Physician: Rosalita Chessman CHASE  Birth Date: [birth year] [age] Patient ID: [id number] Height: 59.5 in. Weight: 128.0 lbs. Measured: 01/10/2020 8:30:48 AM (16 SP 4) Gender: Female Ethnicity: Asian Analyzed: 01/10/2020 8:35:04 AM (16 SP 4) FRAX* 10-year Probability of Fracture Based on femoral neck BMD: DualFemur (Right) Major Osteoporotic Fracture: 8.7% Hip Fracture:                2.3% Population:                  [place] (Asian) Risk Factors:                None *FRAX is a Materials engineer of the [employer] for Metabolic Bone Disease, a [place] (WHO) [facility]. ASSESSMENT: The probability of a major osteoporotic fracture is 8.7% within the next ten years. The probability of a hip fracture is 2.3% within the next ten years. Electronically Signed   By: Lowella Grip III M.D.   On: 01/10/2020 09:13   MM 3D SCREEN BREAST BILATERAL  Result Date: 01/10/2020 CLINICAL DATA:  Screening. EXAM: DIGITAL SCREENING BILATERAL MAMMOGRAM WITH TOMO AND CAD COMPARISON:  Previous exam(s). ACR Breast Density Category b: There are scattered areas of fibroglandular density. FINDINGS: There are no findings suspicious for malignancy. Images were processed with CAD. IMPRESSION: No mammographic evidence of malignancy. A result letter of this screening mammogram will be mailed directly to the patient. RECOMMENDATION: Screening mammogram in one year. (Code:SM-B-01Y) BI-RADS CATEGORY  1: Negative. Electronically Signed   By: Abelardo Diesel M.D.   On: 01/10/2020 13:25     Assessment & Plan:  Plan   Meds ordered this encounter  Medications   lisinopril-hydrochlorothiazide (ZESTORETIC) 10-12.5 MG tablet    Sig: Take 1 tablet by mouth daily.    Dispense:  90 tablet    Refill:  1   ammonium lactate (LAC-HYDRIN) 12 % cream    Sig: Apply topically as needed for dry skin.    Dispense:  385 g    Refill:  0    Problem List Items Addressed This Visit       Unprioritized   Essential  hypertension    Well controlled, no changes to meds. Encouraged heart healthy diet such as the DASH diet and exercise as tolerated.        Relevant Medications   lisinopril-hydrochlorothiazide (ZESTORETIC) 10-12.5 MG tablet   Hyperlipidemia LDL goal <100    Encourage heart healthy diet such as MIND or DASH diet, increase exercise, avoid trans fats, simple carbohydrates and processed foods, consider a krill or fish or flaxseed oil cap daily.        Relevant Medications   lisinopril-hydrochlorothiazide (ZESTORETIC) 10-12.5 MG tablet   Other Visit Diagnoses     Hyperlipidemia, unspecified hyperlipidemia type    -  Primary   Relevant Medications   lisinopril-hydrochlorothiazide (ZESTORETIC) 10-12.5 MG tablet   Other Relevant Orders   Lipid panel   Comprehensive metabolic panel   Primary hypertension       Relevant Medications   lisinopril-hydrochlorothiazide (ZESTORETIC) 10-12.5 MG tablet   Other Relevant Orders   Lipid panel   Comprehensive metabolic panel   Dry skin       Relevant Medications   ammonium lactate (LAC-HYDRIN) 12 % cream       Follow-up: Return in about 6 months (around 10/22/2021), or if symptoms worsen or fail to improve, for annual exam, fasting.   I,Gordon Zheng,acting as a Education administrator for [employer], [place].,have documented all relevant documentation on the behalf of [name], [place],as directed by  [name], [place] while in the presence of [place], [place], have reviewed all documentation for this visit. The documentation on 04/22/21 for the exam, diagnosis, procedures, and orders are all accurate and complete.

## 2021-04-22 NOTE — Patient Instructions (Signed)
[email or web address]">  Ch??ng trnh ?n u?ng DASH DASH Eating Plan DASH l vi?t t?t c?a Dietary Approaches to Stop Hypertension, ngh?a l Ph??ng php ti?p c?n ch? ?? ?n u?ng ?? lm gi?m huy?t p. Ch??ng trnh ?n u?ng DASH l ch??ng trnh ?n u?ng lnh m?nh ? ???c ch?ng minh c tc d?ng: Gi?m huy?t p cao (t?ng huy?t p). Gi?m nguy c? b? ti?u ???ng tup 2, b?nh tim v ??t qu?. Gip gi?m cn. C nh?ng l?i khuyn no ?? tun th? k? ho?ch ny? ??c nhn th?c ph?m Ki?m tra nhn th?c ph?m ?? bi?t l??ng mu?i (natri) trn m?i kh?u ph?n. Ch?n nh?ng th?c ph?m c d??i 5 ph?n tr?m L??ng natri hng ngy. Ni chung, nh?ng th?c ph?m c d??i 300 mg natri m?i kh?u ph?n ph h?p ?? ??a vo ch??ng trnh ?n u?ng ny. ?? tm cc lo?i ng? c?c nguyn h?t, hy ki?m t? "nguyn h?t", l t? ??u tin trong danh sch thnh ph?n. Mua s?m Mua cc s?n ph?m dn nhn "natri th?p" ho?c "khng thm mu?i." Mua th?c ph?m t??i. Trnh nh?ng th?c ph?m ?ng h?p v cc mn ?n ch? bi?n s?n ho?c ?ng l?nh. N?u n??ng Trnh thm mu?i khi n?u ?n. S? d?ng gia v? khng co? mu?i ho?c th?o d??c thay v mu?i ?n ho?c mu?i bi?n. Ki?m tra v?i chuyn gia ch?m [place] s?c kh?e ho??c d???c sy? c?a quy? vi? tr??c khi s? d?ng cc s?n ph?m thay th? mu?i. Khng chin/rn ?? ?n. N?u ?? ?n b?ng nh?ng ph??ng php c l?i cho s?c kh?e nh? b? l, lu?c, n??ng v quay v hun nng. N?u ?n b?ng lo?i d?u t?t cho tim, ch?ng h?n nh? d?u  liu, d?u c?i, d?u b?, ??u nnh ho?c h??ng d??ng. Ln k? ho?ch cho b?a ?n  ?n ch? ?? ?n cn ??i bao g?m: T? 4 kh?u ph?n tri cy v 4 kh?u ph?n rau c? tr? ln m?i ngy. C? g?ng dnh m?t n?a ??a cho tri cy v rau c?. 6-8 kh?u ph?n ng? c?c nguyn cm m?i ngy. D??i 6 ao-x? (170 g) th?t n?c, th?t gia c?m ho?c c m?i ngy. M?t kh?u ph?n 3 ao-x? (85 g) th?t t??ng ???ng kch th??c c?a m?t b? bi. M?t qu? tr?ng t??ng ???ng 1 ao-x? (28 g). 2-3 kh?u ph?n s?a t bo m?i ngy. M?t kh?u ph?n l 1 c?c (237  mL). 1 kh?u ph?n qu? h?ch, cc lo?i h?t ho?c ??u dng 5 l?n m?i tu?n. 2-3 kh?u ph?n m? t?t cho tim. Ch?t bo lnh m?nh c tn l axit bo omega-3 ???c tm th?y trong cc th?c ph?m nh? qu? c ch, h?t lanh, s?a t?ng c??ng v tr?ng. Nh?ng ch?t bo ny c?ng ???c tm th?y trong c n??c l?nh, ch?ng h?n nh? c mi, c h?i v c thu. Gi?i h?n l??ng: Th?c ph?m ?ng h?p ho?c ?ng gi s?n. Th?c ph?m giu ch?t bo chuy?n ha, ch?ng h?n nh? m?t s? th?c ph?m chin/rn. Th?c ph?m giu ch?t bo bo ha, ch?ng h?n nh? th?t m?. Cc mn trng mi?ng v ?? ng?t khc, ?? u?ng c ???ng v nh?ng th?c ph?m khc ch?a ???ng ph? gia. S?n ph?m t? s?a nguyn ch?t bo. Khng thm mu?i vo th?c ph?m tr??c khi ?n. Khng ?n qu 4 lng ?? tr?ng m?i tu?n. C? g?ng ?n t?i thi?u 2 b?a chay m?i tu?n. ?n nhi?u th?c ?n n?u t?i nh h?n v ?n i?t th??c ?n ?? nh hng, th??c ?n t? ch?n v th?c ?n nhanh h?n.  L?i s?ng Khi ?  n ? nh hng, hy yu c?u th??c ?n c?a qu v? ???c n?u nha?t ho?c khng c mu?i, n?u c th?. N?u qu v? u?ng r??u: Gi?i h?n l??ng r??u qu v? u?ng ? m?c: 0-1 ly/ngy ??i v?i ph? n? khng mang thai. 0-2 ly/ngy ??i v?i nam gi?i. Bi?t r m?t ly c bao nhiu r??u. ? M?, m?t ly t??ng ???ng v?i m?t chai bia 12 ao x? (355 mL), m?t ly r??u vang 5 ao x? (148 mL), ho?c m?t ly r??u m?nh 1 ao x? (44 mL). Thng tin chung Trnh ?n trn 2.300 mg mu?i m?i ngy. N?u b? t?ng huy?t p, qu v? c th? c?n gi?m l??ng dng natri xu?ng ??n m?c 1.500 mg m?i ngy. H?p tc v?i chuyn gia ch?m [place] s?c kh?e c?a qu v? ?? duy tr tr?ng l??ng c? th? c l?i cho s?c kh?e ho?c ?? gi?m cn. Hy h?i xem tr?ng l??ng no l l t??ng cho qu v?. Dnh t nh?t 30 pht t?p th? d?c c th? khi?n tim qu v? ??p nhanh h?n (t?p th? d?c nh?p ?i?u) h?u h?t cc ngy trong tu?n. Cc ho?t ??ng c th? bao g?m ?i b?, b?i, ho?c ??p xe. H?p tc v?i chuyn gia ch?m [place] s?c kh?e ho?c bc s? chuyn khoa dinh d??ng c?a qu v? ?? ?i?u ch?nh ch??ng trnh ?n u?ng ph h?p v?i  nhu c?u calo c?a c nhn qu v?. Ti nn ?n nh?ng th?c ph?m no? Tri cy T?t c? tri cy t??i, kh, ho?c ?ng l?nh. Tri cy ?ng h?p d??i d?ng n??c pt? nhin (khng c b? sung ???ng). Marlou Starks c? Marlou Starks c? t??i ho?c ?ng l?nh (s?ng, h?p, bo? lo? ho?c n??ng). N???c p c chua v n??c rau p i?t natri ho??c gi?m natri. N??c x?t c chua va? h?n h?p c chuanho i?t natri ho??c gi?m natri. Rau ?o?ng h?p i?t natri ho??c gia?m natri. Ng? c?c Bnh m nguyn h?t ho?c nguyn cm. M ?ng nguyn h?t ho?c nguyn cm. Ga?o l??t. B?t y?n m?ch. H?t dim m?ch (quinoa). T?m la m (bulgur). Ng? c?c nguyn h?t v ng? c?c t natri. Bnh m pita. Bnh quy gin t bo, t natri. Bnh ngnguyn cm. Th?t v cc protein khc Ga? ho??c ga? ty bo? da. G ho?c g ty xay. Th?t heo l?c m?. C v h?i s?n. Lng tr?ng tr?ng. Cc lo?i ??u, ??u H Lan ho??c ??u l?ng kh. Qu? h?ch, b? h?t v cc lo?i h?t khng mu?i. ??u ?ng h?p khng ??p mu?i. Mi?ng th?t b n?c ? l?c m?. Th?t n?c n?u s?n t natri ho?c th?t ??p mu?i, ch?ng h?n nh? xc xchho?c bnh m th?t. S?a S?a t bo (1%) ho?c khng bo (tch bo). Ph mt khng bo, t bo, ho?c gi?m bo. Ph mt s?a g?n kem ho?c ph mt ricotta t natri, khng bo. S?a chua tbo ho?c khng bo. Ph mt t bo, t natri. M? v d?u B? th?c v?t m?m khng c ch?t bo chuy?n ha. D?u th?c v?t. N??c x?t mayonnaise v mn tr?n sa lt t bo, gi?m bo ho?c lo?i nh? (gi?m natri). D?u c?i, d?u rum, d?u  liu, d?u b?, d?u ??u nnh v d?uh??ng d??ng. Qu? b?. Cassell Smiles v? Th?o d??c. Gia v?. H?n h?p gia v? khng c mu?i. Cc th?c ph?m khc B?ng ng va? ba?nh quy khng mu?i. ?? ng?t khng bo. Nh?ng th?c ph?m li?t k ? trn c th? khng ph?i l m?t danh m?c ??y ?? cc lo?i th?c ph?m v ?? u?ng  m qu v? c th? ?n. Lin h? v?i chuyn gia dinh d??ng ?? c thm thng tin. Ti nn trnh nh?ng th?c ph?m no? Tri cy Tri cy ?ng h?p ngm xi-r loa?ng ho??c ???c. Tri cy kh. Tri cy ngmtrong kem ho?c n??c x?t  b?. Marlou Starks c? Marlou Starks c? tr?n kem ho??c xa?o. Cc lo?i rau tr?n n??c x?t pho mt. Rau ?o?ng h?p thng th??ng (khng ph?i lo?i t natri ho??c gia?m natri). N??c x?t c chua ?ng h?p va? h?n h?p c chua nho thng th??ng (khng ph?i lo?i i?t natri ho??c gi?m natri). N???c p rau v c chua thng th??ng (khng ph?i lo?i i?tnatri ho??c gi?m natri). [place]. Ng? c?c Cc lo?i bnh n??ng c ch?t bo, ch?ng h?n bnh s?ng b, bnh n??ng x?p, ho?cm?t s? lo?i bnh m. M ?ng kh ho?c cc ti b?t g?o. Th?t v cc protein khc Cc la?t thi?t m??. X??ng s??n. Th?t kh. Th?t l?n mu?i xng khi. Xc xch bologna, [place], v cc lo?i th?t n?u s?n ho?c ??p mu?i khc, ch?ng h?n nh? xc xch ho?c bnh m th?t. Ch?t bo ? ph?n l?ng c?a heo (th?t m?). Mn xc xch l?n ?? rn. Qu? h?ch v cc lo?i h?t ??p mu?i. ??u ?ng h?p c b? sung mu?i. C ?ng h?p ho?c hun khi. Nguyn qu? tr?ng ho?c lng ?? tr?ng.G ho?c g ty cn da. S?a S??a nguyn kem ho??c 2%, kem v n?a n? n?a kia. Ph mt nguyn kem ho?c ph mt kem nguyn ch?t bo. S??a chua nguyn ch?t bo ho?c s?a chua co? ????ng. Pho mt nguyn ch?t bo. B?t kem khng s?a. L??p phu? kem ?a? ?a?nh bng. Phomt ch? bi?n s?n v pho mt ph?t. M? v d?u B?. B? th?c v?t d?ng th?i. M? l?n. M? tr?u (shortening). B? s??a tru. M??thi?t xng kho?i. D?u nhi?t ??i, ch?ng h?n nh? d?u d?a, d?u h?t c?, ho?c d?u c?. Gia v? Mu?i hnh, mu?i t?i, mu?i nm, mu?i ?n v mu?i bi?n. N???c x?t [place]. N???c x?t tartar. N??c x?t th?t quay. N???c x?t [place]. N??c t??ng, bao g?m loa?i gi?m natri. N??c x?t th?t n??ng. N???c thi?t ?ng h?p v ?ng gi. N??c m?m. D?u ho. N??c x?t cocktail. C?i ng?a mua t?i c?a hng. N??c x?t c chua. M t?t. H??ng li?u th?t v ch?t la?m m?m thi?t. Tho?i b?t canh. T??ng ?t. N??c x?t marinat ch? bi?n s?n ho?c ?ng gi. Gia v? taco ch? bi?n s?nho?c ?ng gi. ?? gia v?. N???c tr?n sa la?t thng th???ng. Cc th?c ph?m khc B?ng ng v bnh quy  m??n. Nh?ng th?c ph?m li?t k ? trn c th? khng ph?i l m?t danh m?c ??y ?? cc lo?i th?c ph?m v ?? u?ng qu v? nn trnh. Lin h? v?i chuyn gia dinh d??ng ?? c thm thng tin. N?i tm ki?m thm thng tin National Heart, Lung, and Blood Institute (Vi?n Tim, Ph?i v Mu Qu?c gia): [email or web address]://[email or web address]-eaton.[email or web address]/ American Heart Association (Hi?p h?i Tim m?ch [place]?): www.heart.org Academy of Nutrition and Dietetics ([place] d??ng v Ch? ?? ?n): www.eatright.[place]? Th?n Qu?c Gia): www.kidney.org Tm t?t Ch??ng trnh ?n u?ng DASH l ch??ng trnh ?n u?ng lnh m?nh ? ???c ch?ng minh c tc d?ng lm gi?m huy?t p cao (t?ng huy?t p). N c?ng c th? lm gi?m nguy c? b? ti?u d??ng tup 2, b?nh tim v ??t qu?. Khi ?ang th?c hi?n theo ch??ng trnh ?n u?ng DASH, m?c ?ch l ?n nhi?u tri cy v rau c? t??i,  ng? c?c nguyn h?t, protein th?t n?c, s?a t bo v ch?t bo t?t cho tim h?n. V?i ch??ng trnh ?n u?ng DASH, qu v? c?n ph?i gi?i h?n l??ng mu?i (natri) ? m?c 2.300 mg m?i ngy. N?u b? t?ng huy?t p, qu v? c th? c?n gi?m l??ng dng natri xu?ng ??n m?c 1.500 mg m?i ngy. H?p tc v?i chuyn gia ch?m [place] s?c kh?e ho?c bc s? chuyn khoa dinh d??ng c?a qu v? ?? ?i?u ch?nh ch??ng trnh ?n u?ng ph h?p v?i nhu c?u calo c?a c nhn qu v?. Thng tin ny khng nh?m m?c ?ch thay th? cho l?i khuyn m chuyn gia ch?m [place] s?c kh?e ni v?i qu v?. Hy b?o ??m qu v? ph?i th?o lu?n b?t k? v?n ?? gm qu v? c v?i chuyn gia ch?m [place] s?c kh?e c?a qu v?. Document Revised: 11/23/2019 Document Reviewed: 11/23/2019 Elsevier Patient Education  2022 [employer].

## 2021-04-24 ENCOUNTER — Encounter: Payer: Self-pay | Admitting: *Deleted

## 2021-06-11 ENCOUNTER — Encounter: Payer: Self-pay | Admitting: Family Medicine

## 2021-06-11 ENCOUNTER — Ambulatory Visit (HOSPITAL_BASED_OUTPATIENT_CLINIC_OR_DEPARTMENT_OTHER)
Admission: RE | Admit: 2021-06-11 | Discharge: 2021-06-11 | Disposition: A | Discharge status: Home / Self Care | Payer: Medicare Other | Source: Ambulatory Visit | Attending: Family Medicine | Admitting: Family Medicine

## 2021-06-11 ENCOUNTER — Other Ambulatory Visit: Payer: Self-pay

## 2021-06-11 ENCOUNTER — Ambulatory Visit (INDEPENDENT_AMBULATORY_CARE_PROVIDER_SITE_OTHER): Payer: Medicare Other | Admitting: Family Medicine

## 2021-06-11 VITALS — BP 148/100 | HR 89 | Temp 98.3°F | Resp 18 | Ht 60.0 in | Wt 135.8 lb

## 2021-06-11 DIAGNOSIS — M25512 Pain in left shoulder: Secondary | ICD-10-CM | POA: Diagnosis not present

## 2021-06-11 DIAGNOSIS — E785 Hyperlipidemia, unspecified: Secondary | ICD-10-CM | POA: Diagnosis not present

## 2021-06-11 LAB — COMPREHENSIVE METABOLIC PANEL
ALT: 17 U/L (ref 0–35)
AST: 23 U/L (ref 0–37)
Albumin: 4.7 g/dL (ref 3.5–5.2)
Alkaline Phosphatase: 57 U/L (ref 39–117)
BUN: 13 mg/dL (ref 6–23)
CO2: 28 mEq/L (ref 19–32)
Calcium: 9.4 mg/dL (ref 8.4–10.5)
Chloride: 103 mEq/L (ref 96–112)
Creatinine, Ser: 0.75 mg/dL (ref 0.40–1.20)
GFR: 75.28 mL/min (ref >60.00)
Glucose, Bld: 93 mg/dL (ref 70–99)
Potassium: 3.9 mEq/L (ref 3.5–5.1)
Sodium: 140 mEq/L (ref 135–145)
Total Bilirubin: 1 mg/dL (ref 0.2–1.2)
Total Protein: 8 g/dL (ref 6.0–8.3)

## 2021-06-11 LAB — LIPID PANEL
Cholesterol: 217 mg/dL — ABNORMAL HIGH (ref 0–200)
HDL: 46.4 mg/dL (ref >39.00)
NonHDL: 170.63
Total CHOL/HDL Ratio: 5
Triglycerides: 280 mg/dL — ABNORMAL HIGH (ref 0.0–149.0)
VLDL: 56 mg/dL — ABNORMAL HIGH (ref 0.0–40.0)

## 2021-06-11 LAB — LDL CHOLESTEROL, DIRECT: Direct LDL: 131 mg/dL

## 2021-06-11 MED ORDER — METHOCARBAMOL 500 MG PO TABS: 500.0000 mg | ORAL_TABLET | Freq: Four times a day (QID) | ORAL | 0 refills | Status: AC

## 2021-06-11 MED ORDER — MELOXICAM 7.5 MG PO TABS: 7.5000 mg | ORAL_TABLET | Freq: Every day | ORAL | 0 refills | Status: AC

## 2021-06-11 NOTE — Progress Notes (Signed)
Subjective:   By signing my name below, I, Ariel Williams, attest that this documentation has been prepared under the direction and in the presence of Dr. Roma Schanz, [place]. 06/11/2021    Patient ID: Ariel Williams, female    DOB: Jan 12, 1941, 80 y.o.   MRN: [id number]  Chief Complaint  Patient presents with   Shoulder Pain    Left shoulder, 3-4 days, no fall or injury. No chest pain.Pt states hurts to raise arm.     HPI Patient is in today for a office visit. She is present today with a translator during this visit. The translators code is [phone] She complains of left shoulder pain for the past 5 days. Her pain worsens while lifting her left arm. The pain radiates down her arm. She also notes occasional numbness. She uses salonpas pads to manage her pain and finds mild relief. She also uses tylenol to manage her pain and helps her sleep with the pain but finds the pain returns in morning. She denies having any chest pain or recent falls at this time.    Past Medical History:  Diagnosis Date   Arthritis    H. pylori infection     Past Surgical History:  Procedure Laterality Date   TUBAL LIGATION      Family History  Problem Relation Age of Onset   Asthma Brother     Social History   Socioeconomic History   Marital status: Married    Spouse name: Not on file   Number of children: 9   Years of education: Not on file   Highest education level: Not on file  Occupational History   Not on file  Tobacco Use   Smoking status: Never   Smokeless tobacco: Never  Vaping Use   Vaping Use: Never used  Substance and Sexual Activity   Alcohol use: No    Alcohol/week: 0.0 standard drinks   Drug use: No   Sexual activity: Not on file  Other Topics Concern   Not on file  Social History Narrative   From [name] been 67 years lives with husband   Social Determinants of Radio broadcast assistant Strain: Not on file  Food Insecurity: Not on file  Transportation Needs: Not  on file  Physical Activity: Not on file  Stress: Not on file  Social Connections: Not on file  Intimate Partner Violence: Not on file    Outpatient Medications Prior to Visit  Medication Sig Dispense Refill   ammonium lactate (LAC-HYDRIN) 12 % cream Apply topically as needed for dry skin. 385 g 0   lisinopril-hydrochlorothiazide (ZESTORETIC) 10-12.5 MG tablet Take 1 tablet by mouth daily. 90 tablet 1   Multiple Vitamins-Minerals (WOMENS MULTIVITAMIN PO) Take 1 tablet by mouth daily.     NONFORMULARY OR COMPOUNDED ITEM bp cuff  #1  Dx htn 1 each 0   rosuvastatin (CRESTOR) 10 MG tablet Take 1 tablet (10 mg total) by mouth daily. 90 tablet 3   No facility-administered medications prior to visit.    Allergies  Allergen Reactions   Tramadol Hcl     REACTION: blurred vision    Review of Systems  Constitutional:  Negative for fever and malaise/fatigue.  HENT:  Negative for congestion.   Eyes:  Negative for blurred vision.  Respiratory:  Negative for cough and shortness of breath.   Cardiovascular:  Negative for chest pain, palpitations and leg swelling.  Gastrointestinal:  Negative for vomiting.  Musculoskeletal:  Positive for joint pain (  Left shoulder) and myalgias (Left arm). Negative for back pain and falls.  Skin:  Negative for rash.  Neurological:  Positive for tingling (occasional numbness in left arm). Negative for loss of consciousness and headaches.      Objective:    Physical Exam Constitutional:      General: She is not in acute distress.    Appearance: Normal appearance. She is not ill-appearing.  HENT:     Head: Normocephalic and atraumatic.     Right Ear: External ear normal.     Left Ear: External ear normal.  Eyes:     Extraocular Movements: Extraocular movements intact.     Pupils: Pupils are equal, round, and reactive to light.  Cardiovascular:     Rate and Rhythm: Normal rate and regular rhythm.     Heart sounds: Normal heart sounds. No murmur heard.    No gallop.  Pulmonary:     Effort: Pulmonary effort is normal. No respiratory distress.     Breath sounds: Normal breath sounds. No wheezing, rhonchi or rales.  Chest:     Chest wall: No tenderness.  Musculoskeletal:        General: Tenderness present.     Comments: Knot felt on left trapezius muscle-- with pain on palpation  Pain with elevating L arm   Skin:    General: Skin is warm and dry.  Neurological:     Mental Status: She is alert and oriented to person, place, and time.  Psychiatric:        Behavior: Behavior normal.        Judgment: Judgment normal.    BP (!) 148/100 (BP Location: Right Arm, Patient Position: Sitting, Cuff Size: Normal)   Pulse 89   Temp 98.3 F (36.8 C) (Oral)   Resp 18   Ht 5' (1.524 m)   Wt 135 lb 12.8 oz (61.6 kg)   SpO2 96%   BMI 26.52 kg/m  Wt Readings from Last 3 Encounters:  06/11/21 135 lb 12.8 oz (61.6 kg)  04/22/21 134 lb 9.6 oz (61.1 kg)  01/18/21 131 lb 12.8 oz (59.8 kg)    Diabetic Foot Exam - Simple   No data filed    Lab Results  Component Value Date   WBC 5.7 05/28/2020   HGB 12.8 05/28/2020   HCT 37.8 05/28/2020   PLT 236.0 05/28/2020   GLUCOSE 100 (H) 04/22/2021   CHOL 141 04/22/2021   TRIG 132.0 04/22/2021   HDL 47.60 04/22/2021   LDLDIRECT 126.0 01/09/2020   LDLCALC 67 04/22/2021   ALT 22 04/22/2021   AST 25 04/22/2021   NA 139 04/22/2021   K 4.6 04/22/2021   CL 102 04/22/2021   CREATININE 0.86 04/22/2021   BUN 27 (H) 04/22/2021   CO2 29 04/22/2021   TSH 2.36 02/18/2016    Lab Results  Component Value Date   TSH 2.36 02/18/2016   Lab Results  Component Value Date   WBC 5.7 05/28/2020   HGB 12.8 05/28/2020   HCT 37.8 05/28/2020   MCV 92.4 05/28/2020   PLT 236.0 05/28/2020   Lab Results  Component Value Date   NA 139 04/22/2021   K 4.6 04/22/2021   CO2 29 04/22/2021   GLUCOSE 100 (H) 04/22/2021   BUN 27 (H) 04/22/2021   CREATININE 0.86 04/22/2021   BILITOT 1.1 04/22/2021   ALKPHOS 63  04/22/2021   AST 25 04/22/2021   ALT 22 04/22/2021   PROT 7.8 04/22/2021   ALBUMIN 4.9 04/22/2021  CALCIUM 10.0 04/22/2021   ANIONGAP 10 01/25/2016   GFR 63.94 04/22/2021   Lab Results  Component Value Date   CHOL 141 04/22/2021   Lab Results  Component Value Date   HDL 47.60 04/22/2021   Lab Results  Component Value Date   LDLCALC 67 04/22/2021   Lab Results  Component Value Date   TRIG 132.0 04/22/2021   Lab Results  Component Value Date   CHOLHDL 3 04/22/2021   No results found for: HGBA1C     Assessment & Plan:   Problem List Items Addressed This Visit       Unprioritized   Acute pain of left shoulder - Primary    Xray  Muscle relaxer and antiinflamatory  Ortho/ sport med        Relevant Medications   meloxicam (MOBIC) 7.5 MG tablet   methocarbamol (ROBAXIN) 500 MG tablet   Other Relevant Orders   DG Shoulder Left   Other Visit Diagnoses     Hyperlipidemia, unspecified hyperlipidemia type            Meds ordered this encounter  Medications   meloxicam (MOBIC) 7.5 MG tablet    Sig: Take 1 tablet (7.5 mg total) by mouth daily.    Dispense:  30 tablet    Refill:  0   methocarbamol (ROBAXIN) 500 MG tablet    Sig: Take 1 tablet (500 mg total) by mouth 4 (four) times daily.    Dispense:  45 tablet    Refill:  0    I, Dr. Roma Schanz, [place], personally preformed the services described in this documentation.  All medical record entries made by the scribe were at my direction and in my presence.  I have reviewed the chart and discharge instructions (if applicable) and agree that the record reflects my personal performance and is accurate and complete. 06/11/2021   I,Ariel Williams,acting as a scribe for [name], [place].,have documented all relevant documentation on the behalf of [name], [place],as directed by  [name], [place] while in the presence of [name], [place].   [name], [place]

## 2021-06-11 NOTE — Patient Instructions (Signed)
Shoulder Pain °Many things can cause shoulder pain, including: °An injury to the shoulder. °Overuse of the shoulder. °Arthritis. °The source of the pain can be: °Inflammation. °An injury to the shoulder joint. °An injury to a tendon, ligament, or bone. °Follow these instructions at home: °Pay attention to changes in your symptoms. Let your health care provider know about them. Follow these instructions to relieve your pain. °If you have a sling: °Wear the sling as told by your health care provider. Remove it only as told by your health care provider. °Loosen the sling if your fingers tingle, become numb, or turn cold and blue. °Keep the sling clean. °If the sling is not waterproof: °[place] not let it get wet. Remove it to shower or bathe. °Move your arm as little as possible, but keep your hand moving to prevent swelling. °Managing pain, stiffness, and swelling ° °If directed, put ice on the painful area: °Put ice in a plastic bag. °Place a towel between your skin and the bag. °Leave the ice on for 20 minutes, 2-3 times per day. Stop applying ice if it does not help with the pain. °Squeeze a soft ball or a foam pad as much as possible. This helps to keep the shoulder from swelling. It also helps to strengthen the arm. °General instructions °Take over-the-counter and prescription medicines only as told by your health care provider. °Keep all follow-up visits as told by your health care provider. This is important. °Contact a health care provider if: °Your pain gets worse. °Your pain is not relieved with medicines. °New pain develops in your arm, hand, or fingers. °Get help right away if: °Your arm, hand, or fingers: °Tingle. °Become numb. °Become swollen. °Become painful. °Turn white or blue. °Summary °Shoulder pain can be caused by an injury, overuse, or arthritis. °Pay attention to changes in your symptoms. Let your health care provider know about them. °This condition may be treated with a sling, ice, and pain  medicines. °Contact your health care provider if the pain gets worse or new pain develops. Get help right away if your arm, hand, or fingers tingle or become numb, swollen, or painful. °Keep all follow-up visits as told by your health care provider. This is important. °This information is not intended to replace advice given to you by your health care provider. Make sure you discuss any questions you have with your health care provider. °Document Revised: 05/04/2018 Document Reviewed: 05/04/2018 °Elsevier Patient Education © 2022 Elsevier Inc. ° °

## 2021-06-11 NOTE — Assessment & Plan Note (Signed)
Xray  Muscle relaxer and antiinflamatory  Ortho/ sport med

## 2021-06-13 ENCOUNTER — Telehealth: Payer: Self-pay | Admitting: Family Medicine

## 2021-06-13 NOTE — Telephone Encounter (Signed)
Patients's daughter called in regard of her mom's medicine. She said that the one that was prescribed is not working and her mom is still in pain. She is also inquiring about her mom's x-ray, she was informed that she will be called back with the result. Please advice.

## 2021-06-17 ENCOUNTER — Other Ambulatory Visit: Payer: Self-pay | Admitting: Family Medicine

## 2021-06-17 DIAGNOSIS — E785 Hyperlipidemia, unspecified: Secondary | ICD-10-CM

## 2021-06-17 NOTE — Telephone Encounter (Signed)
I did call last week regarding the x-ray and had to leave a VM. Please advise on medication

## 2021-06-17 NOTE — Telephone Encounter (Signed)
Spoke with patient's daughter. She states she will speak with her mom and call us back tomorrow.

## 2021-06-19 ENCOUNTER — Other Ambulatory Visit: Payer: Self-pay | Admitting: *Deleted

## 2021-06-19 DIAGNOSIS — E785 Hyperlipidemia, unspecified: Secondary | ICD-10-CM

## 2021-06-19 DIAGNOSIS — I10 Essential (primary) hypertension: Secondary | ICD-10-CM

## 2021-06-19 MED ORDER — ROSUVASTATIN CALCIUM 20 MG PO TABS: 20.0000 mg | ORAL_TABLET | Freq: Every day | ORAL | 0 refills | Status: DC

## 2021-06-19 NOTE — Addendum Note (Signed)
Addended by: Kem Boroughs D on: 06/19/2021 01:48 PM   Modules accepted: Orders

## 2021-06-21 ENCOUNTER — Telehealth: Payer: Self-pay | Admitting: Family Medicine

## 2021-06-21 DIAGNOSIS — M25512 Pain in left shoulder: Secondary | ICD-10-CM

## 2021-06-21 NOTE — Telephone Encounter (Signed)
Referral placed.

## 2021-06-21 NOTE — Telephone Encounter (Signed)
Daughter in law calling to request for the sports med referral. She states she discussed with her mother and she is still in a lot of pain/

## 2021-06-25 ENCOUNTER — Ambulatory Visit (INDEPENDENT_AMBULATORY_CARE_PROVIDER_SITE_OTHER): Payer: Medicare Other

## 2021-06-25 ENCOUNTER — Ambulatory Visit: Payer: Self-pay

## 2021-06-25 ENCOUNTER — Encounter: Payer: Self-pay | Admitting: Family Medicine

## 2021-06-25 ENCOUNTER — Ambulatory Visit: Payer: Medicare Other | Admitting: Family Medicine

## 2021-06-25 ENCOUNTER — Other Ambulatory Visit: Payer: Self-pay

## 2021-06-25 VITALS — BP 130/76 | HR 89 | Ht 60.0 in | Wt 135.2 lb

## 2021-06-25 DIAGNOSIS — M25512 Pain in left shoulder: Secondary | ICD-10-CM

## 2021-06-25 DIAGNOSIS — M542 Cervicalgia: Secondary | ICD-10-CM

## 2021-06-25 MED ORDER — GABAPENTIN 100 MG PO CAPS: 100.0000 mg | ORAL_CAPSULE | Freq: Three times a day (TID) | ORAL | 3 refills | Status: DC | PRN

## 2021-06-25 MED ORDER — PREDNISONE 50 MG PO TABS: 50.0000 mg | ORAL_TABLET | Freq: Every day | ORAL | 0 refills | Status: DC

## 2021-06-25 NOTE — Patient Instructions (Signed)
Thank you for coming in today.   Please get an Xray today before you leave  I've referred you to Physical Therapy.  Let us know if you don't hear from them in one week.   Return in 6 weeks. 30 mins. Schedule with [place] interpreter.

## 2021-06-25 NOTE — Progress Notes (Signed)
Subjective:   I, Wendy Poet, LAT, ATC, am serving as scribe for Dr. Lynne Leader.  I'm seeing this patient as a consultation for: Dr. Roma Schanz. Note will be routed back to referring provider/PCP.  CC: L shoulder pain  HPI: Pt is an 80 y/o female c/o L shoulder pain ongoing since early Aug w/ no known MOI. PCP prescribed pt meloxicam and methocarbamol on 06/11/21. Pt locates pain to her L upper trap.  Neck pain: Yes Radiates: yes across both upper traps UE numbness/tingling: yes from her shoulder all the way down to her hand, involving all her fingers UE weakness: yes Aggravates: trying to hold something or lift something heavy; L shoulder overhead AROM Treatments tried: salonpas pads, Tylenol, Meloxicam and Methocarbamol   Dx imaging: 06/11/21 L shoulder XR  Past medical history, Surgical history, Family history, Social history, Allergies, and medications have been entered into the medical record, reviewed.   Review of Systems: No new headache, visual changes, nausea, vomiting, diarrhea, constipation, dizziness, abdominal pain, skin rash, fevers, chills, night sweats, weight loss, swollen lymph nodes, body aches, joint swelling, muscle aches, chest pain, shortness of breath, mood changes, visual or auditory hallucinations.   Objective:    Vitals:   06/25/21 1237  BP: 130/76  Pulse: 89  SpO2: 95%   General: Well Developed, well nourished, and in no acute distress.  Neuro/Psych: Alert and oriented x3, extra-ocular muscles intact, able to move all 4 extremities, sensation grossly intact. Skin: Warm and dry, no rashes noted.  Respiratory: Not using accessory muscles, speaking in full sentences, trachea midline.  Cardiovascular: Pulses palpable, no extremity edema. Abdomen: Does not appear distended. MSK: C-spine normal-appearing Nontender midline. Tender palpation left trapezius. Normal cervical motion.  Some reproduction in pain left arm with cervical motion. Upper  extremity strength is intact. Reflexes and sensation are intact throughout bilateral upper extremities.  Left shoulder normal-appearing Normal motion some pain with abduction. Tender palpation left trapezius. Strength is intact. Positive Hawkins and Neer's test. Mildly positive empty can test. Negative Yergason's and speeds test.    Lab and Radiology Results  Diagnostic Limited MSK Ultrasound of: Left shoulder Biceps tendon intact normal-appearing Subscapularis tendon is intact and normal-appearing Supraspinatus tendon is intact. Moderate hypoechoic fluid tracks superficial to supraspinatus tendon indicating subacromial bursitis. Infraspinatus tendon is intact. AC joint with mild degeneration and effusion Impression: Subacromial bursitis and AC DJD.  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  No acute fractures visible. Await formal radiology review  EXAM: LEFT SHOULDER - 2+ VIEW   COMPARISON:  None.   FINDINGS: There is no evidence of fracture or dislocation. There is no evidence of arthropathy or other focal bone abnormality. Soft tissues are unremarkable.   IMPRESSION: No acute abnormality noted.     Electronically Signed   By: Inez Catalina M.D.   On: 06/11/2021 22:32   I, Lynne Leader, personally (independently) visualized and performed the interpretation of the images attached in this note.    Impression and Recommendations:    Assessment and Plan: 80 y.o. female with multifactorial left shoulder and neck pain.  Patient certainly has some left trapezius dysfunction and periscapular dysfunction as well as left shoulder pain thought to be due to subacromial bursitis.  It is also possible that she has some cervical radiculopathy anywhere from C6-C8 nerve roots.  Discussed treatment plan and options.  Plan for physical therapy as primary treatment option.  Additionally will use low-dose gabapentin and  short  course of prednisone. Recheck in 6 weeks.  [place] interpreter used for today's visit.Marland Kitchen  PDMP not reviewed this encounter. Orders Placed This Encounter  Procedures   [place] LIMITED JOINT SPACE STRUCTURES UP LEFT(NO LINKED CHARGES)    Order Specific Question:   Reason for Exam (SYMPTOM  OR DIAGNOSIS REQUIRED)    Answer:   L shoulder pain    Order Specific Question:   Preferred imaging location?    Answer:   [place]   DG Cervical Spine 2 or 3 views    Standing Status:   Future    Number of Occurrences:   1    Standing Expiration Date:   07/26/2021    Order Specific Question:   Reason for Exam (SYMPTOM  OR DIAGNOSIS REQUIRED)    Answer:   neck pain    Order Specific Question:   Preferred imaging location?    Answer:   [facility]   Ambulatory referral to Physical Therapy    Referral Priority:   Routine    Referral Type:   Physical Medicine    Referral Reason:   Specialty Services Required    Requested Specialty:   Physical Therapy    Number of Visits Requested:   1   Meds ordered this encounter  Medications   predniSONE (DELTASONE) 50 MG tablet    Sig: Take 1 tablet (50 mg total) by mouth daily.    Dispense:  5 tablet    Refill:  0   gabapentin (NEURONTIN) 100 MG capsule    Sig: Take 1 capsule (100 mg total) by mouth 3 (three) times daily as needed (nerve pain).    Dispense:  60 capsule    Refill:  3    Discussed warning signs or symptoms. Please see discharge instructions. Patient expresses understanding.   The above documentation has been reviewed and is accurate and complete Lynne Leader, M.D.

## 2021-06-26 NOTE — Progress Notes (Signed)
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

## 2021-08-02 ENCOUNTER — Ambulatory Visit (INDEPENDENT_AMBULATORY_CARE_PROVIDER_SITE_OTHER): Payer: Medicare Other

## 2021-08-02 ENCOUNTER — Encounter: Payer: Self-pay | Admitting: Family Medicine

## 2021-08-02 DIAGNOSIS — Z23 Encounter for immunization: Secondary | ICD-10-CM

## 2021-08-06 NOTE — Progress Notes (Deleted)
   I, Wendy Poet, LAT, ATC, am serving as scribe for Dr. Lynne Leader.  Decklyn T Massimino is a 80 y.o. female who presents to [place] at [facility] today for f/u of L shoulder pain and neck pain thought to be due to subacromial bursitis and periscapular dysfunction.  She was prescribed gabapentin and a short course of prednisone.  She was also referred to PT at [facility] but has not completed any visits.  Today, pt reports   Diagnostic testing: C-spine XR- 06/25/21; L shoulder XR- 06/11/21  Pertinent review of systems: ***  Relevant historical information: ***   Exam:  There were no vitals taken for this visit. General: Well Developed, well nourished, and in no acute distress.   MSK: ***    Lab and Radiology Results No results found for this or any previous visit (from the past 72 hour(s)). No results found.     Assessment and Plan: 80 y.o. female with ***   PDMP not reviewed this encounter. No orders of the defined types were placed in this encounter.  No orders of the defined types were placed in this encounter.    Discussed warning signs or symptoms. Please see discharge instructions. Patient expresses understanding.   ***

## 2021-08-07 ENCOUNTER — Ambulatory Visit: Payer: Medicare Other | Admitting: Family Medicine

## 2021-10-07 ENCOUNTER — Other Ambulatory Visit: Payer: Self-pay | Admitting: Family Medicine

## 2021-11-05 ENCOUNTER — Ambulatory Visit (INDEPENDENT_AMBULATORY_CARE_PROVIDER_SITE_OTHER): Payer: Medicare Other | Admitting: Family Medicine

## 2021-11-05 ENCOUNTER — Encounter: Payer: Self-pay | Admitting: Family Medicine

## 2021-11-05 VITALS — BP 160/100 | HR 84 | Temp 98.2°F | Resp 16 | Ht 60.0 in | Wt 140.2 lb

## 2021-11-05 DIAGNOSIS — I10 Essential (primary) hypertension: Secondary | ICD-10-CM | POA: Diagnosis not present

## 2021-11-05 DIAGNOSIS — D229 Melanocytic nevi, unspecified: Secondary | ICD-10-CM

## 2021-11-05 DIAGNOSIS — Z0000 Encounter for general adult medical examination without abnormal findings: Secondary | ICD-10-CM | POA: Diagnosis not present

## 2021-11-05 DIAGNOSIS — E785 Hyperlipidemia, unspecified: Secondary | ICD-10-CM | POA: Diagnosis not present

## 2021-11-05 DIAGNOSIS — E2839 Other primary ovarian failure: Secondary | ICD-10-CM | POA: Diagnosis not present

## 2021-11-05 LAB — COMPREHENSIVE METABOLIC PANEL
ALT: 16 U/L (ref 0–35)
AST: 19 U/L (ref 0–37)
Albumin: 4.5 g/dL (ref 3.5–5.2)
Alkaline Phosphatase: 60 U/L (ref 39–117)
BUN: 13 mg/dL (ref 6–23)
CO2: 27 mEq/L (ref 19–32)
Calcium: 9 mg/dL (ref 8.4–10.5)
Chloride: 104 mEq/L (ref 96–112)
Creatinine, Ser: 0.68 mg/dL (ref 0.40–1.20)
GFR: 82.12 mL/min (ref >60.00)
Glucose, Bld: 90 mg/dL (ref 70–99)
Potassium: 3.7 mEq/L (ref 3.5–5.1)
Sodium: 140 mEq/L (ref 135–145)
Total Bilirubin: 0.9 mg/dL (ref 0.2–1.2)
Total Protein: 7.5 g/dL (ref 6.0–8.3)

## 2021-11-05 LAB — CBC WITH DIFFERENTIAL/PLATELET
Basophils Absolute: 0 10*3/uL (ref 0.0–0.1)
Basophils Relative: 0.4 % (ref 0.0–3.0)
Eosinophils Absolute: 0.1 10*3/uL (ref 0.0–0.7)
Eosinophils Relative: 2.1 % (ref 0.0–5.0)
HCT: 39.1 % (ref 36.0–46.0)
Hemoglobin: 13.1 g/dL (ref 12.0–15.0)
Lymphocytes Relative: 33.5 % (ref 12.0–46.0)
Lymphs Abs: 1.8 10*3/uL (ref 0.7–4.0)
MCHC: 33.5 g/dL (ref 30.0–36.0)
MCV: 88.8 fl (ref 78.0–100.0)
Monocytes Absolute: 0.4 10*3/uL (ref 0.1–1.0)
Monocytes Relative: 7.4 % (ref 3.0–12.0)
Neutro Abs: 3 10*3/uL (ref 1.4–7.7)
Neutrophils Relative %: 56.6 % (ref 43.0–77.0)
Platelets: 222 10*3/uL (ref 150.0–400.0)
RBC: 4.4 Mil/uL (ref 3.87–5.11)
RDW: 12.7 % (ref 11.5–15.5)
WBC: 5.3 10*3/uL (ref 4.0–10.5)

## 2021-11-05 LAB — LIPID PANEL
Cholesterol: 198 mg/dL (ref 0–200)
HDL: 42 mg/dL (ref >39.00)
NonHDL: 156.38
Total CHOL/HDL Ratio: 5
Triglycerides: 217 mg/dL — ABNORMAL HIGH (ref 0.0–149.0)
VLDL: 43.4 mg/dL — ABNORMAL HIGH (ref 0.0–40.0)

## 2021-11-05 LAB — LDL CHOLESTEROL, DIRECT: Direct LDL: 139 mg/dL

## 2021-11-05 MED ORDER — ROSUVASTATIN CALCIUM 20 MG PO TABS: ORAL_TABLET | ORAL | 1 refills | Status: DC

## 2021-11-05 MED ORDER — LISINOPRIL-HYDROCHLOROTHIAZIDE 20-25 MG PO TABS: 1.0000 | ORAL_TABLET | Freq: Every day | ORAL | 3 refills | Status: DC

## 2021-11-05 NOTE — Patient Instructions (Signed)
Ch?m [place] phng ng?a t? 65 tu?i tr? ln, N? gi?i Preventive Care 79 Years and Older, Female Ch?m [place] phng ng?a ?? c?p ??n cc l?a ch?n l?i s?ng v cc l?n khm v?i chuyn gia ch?m [place] s?c kh?e c th? t?ng c??ng s?c kh?e v h?nh phc. Cc l?n khm ch?m [place] phng ng?a cn ???c g?i l khm s?c kh?e. Ti c th? d? ki?n nh?ng g ??i v?i l?n khm ch?m [place] phng ng?a c?a ti? T? v?n Chu[place] gia ch?m [place] s?c kh?e c th? h?i qu v? v?: B?nh s?, bao g?m: Cc v?n ?? b?nh l tr??c ?y. B?nh s? gia ?nh. Ti?n s? mang thai v kinh nguy?t. Ti?n s? b? ng. S?c kh?e hi?n t?i, bao g?m: Tr nh? v kh? n?ng hi?u (nh?n th?c). Tnh tr?ng s?c kh?e tinh th?n. Cu?c s?ng ? nh v tnh tr?ng h?nh phc trong m?i quan h?. Quan h? tnh d?c v s?c kh?e tnh d?c. L?i s?ng, bao g?m: S? d?ng r??u, nicotine ho?c thu?c l v ma ty. Ti?p c?n v?i v? kh. Cc thi quen ?n, t?p luy?n v ng?. Cng vi?c v mi tr??ng lm vi?c. S? d?ng kem ch?ng n?ng. Cc v?n ?? an ton nh? th?t dy an ton v ??i m? b?o hi?m xe ??p. Khm th?c th? Chuyn gia ch?m [place] s?c kh?e s? ki?m tra qu v? v?: Chi?u cao v cn n?ng. Nh?ng thng s? ny c th? ???c s? d?ng ?? tnh ton BMI (ch? s? kh?i c? th?) c?a qu v?. BMI l m?t s? ?o cho bi?t qu v? c cn n?ng t?t cho s?c kh?e hay khng. Vng eo. ?y l ?o vng eo c?a qu v?. Php ?o ny c?ng cho bi?t li?u qu v? c cn n?ng t?t cho s?c kh?e khng v c th? gip d? ?on nguy c? m?c m?t s? b?nh, ch?ng h?n nh? ti?u ???ng tup 2 v huy?t p cao. Nh?p tim v huy?t p. Thn nhi?t. Da xem c cc ??m b?t th??ng hay khng. Ti c?n ch?ng ng?a nh?ng g? V?c xin th??ng ???c tim ? cc ?? tu?i khc nhau, theo l?ch. Chuyn gia ch?m [place] s?c kh?e s? khuy?n ngh? cc lo?i v?c xin dnh cho qu v? d?a trn tu?i, b?nh s? v l?i s?ng ho?c cc y?u t? khc, ch?ng h?n nh? ?i l?i ho?c n?i qu v? lm vi?c. Ti c?n lm cc xt nghi?m no? Sng l?c Chuyn gia ch?m [place] s?c kh?e c th? khuy?n ngh? cc ki?m tra sng l?c cho m?t  s? tnh tr?ng nh?t ??nh. Vi?c ny c th? bao g?m: N?ng ?? lipid v cholesterol. Xt nghi?m vim gan C. Xt nghi?m vim gan B. Xt nghi?m HIV (vi rt gy suy gi?m mi?n d?ch ? ng??i). Xt nghi?m STI (b?nh nhi?m trng ly truy?n qua ???ng tnh d?c), n?u qu v? c nguy c?. Sng l?c ung th? ph?i. Sng l?c ung th? ??i tr?c trng. Sng l?c ti?u ???ng. Vi?c ny ???c th?c hi?n b?ng cch ki?m tra ???ng huy?t (glucose) sau khi qu v? khng ?n g trong m?t kho?ng th?i gian (nh?n ?i). Ch?p X quang tuy?n v. Hy trao ??i v?i chuyn gia ch?m [place] s?c kh?e v? vi?c qu v? nn ch?p x-quang tuy?n v ??nh k? bao lu m?t l?n. Sng l?c ung th? lin quan ??n BRCA. Xt nghi?m ny c th? ???c th?c hi?n n?u qu v? c ti?n s? gia ?nh b? ung th? v, ung th? bu?ng tr?ng, ung th? ?ng d?n tr?ng ho?c ung th? phc  m?c. Ch?p m?t ?? x??ng. Ki?m tra ny ???c th?c hi?n ?? sng l?c b?nh long x??ng. Trao ??i v?i chuyn gia ch?m [place] s?c kh?e cu?a qu v? v? cc k?t qu? xt nghi?m, cc l?a ch?n ?i?u tr? v n?u c?n, nhu c?u lm thm cc ki?m tra. Tun th? nh?ng h??ng d?n ny ? nh: ?n v u?ng  ?n ch? ?? ?n bao g?m tri cy v rau c? t??i, ng? c?c nguyn h?t, protein t? th?t n?c v cc s?n ph?m t? s?a t ch?t bo. H?n ch? l??ng th?c ph?m c l??ng ???ng cao, ch?t bo bo ha v mu?i. Dng th?c ph?m ch?c n?ng c vitamin v ch?t khong theo khuy?n ngh? c?a chuyn gia ch?m [place] s?c kh?e. Khng u?ng r??u n?u chuyn gia ch?m [place] s?c kh?e c?a qu v? [place] qu v? khng u?ng r??u. N?u qu v? u?ng r??u: Gi?i h?n l??ng r??u qu v? u?ng ? 0-1 ly m?i ngy. Bi?t m?t ly c bao nhiu r??u. ? M?, m?t ly t??ng ???ng v?i m?t chai bia 12 ao x? (355 mL), m?t ly r??u vang 5 ao x? (148 mL), ho?c m?t ly r??u m?nh 1 ao x? (44 mL). L?i s?ng ?nh r?ng m?i sng v t?i b?ng kem ?nh r?ng c flo. Dng ch? nha khoa lm s?ch k? r?ng m?i ngy m?t l?n. T?p th? d?c t nh?t 30 pht, t? 5 ngy tr? ln m?i tu?n. Khng s? d?ng b?t k? s?n ph?m no c nicotine ho?c thu?c  l. Nh?ng s?n ph?m ny bao g?m thu?c l d?ng ht, thu?c l d?ng nhai v d?ng c? ht thu?c, ch?ng h?n nh? thu?c l ?i?n t?. N?u qu v? c?n gip ?? ?? cai thu?c, hy h?i chuyn gia ch?m [place] s?c kh?e. Khng s? d?ng ma ty. N?u qu v? c quan h? tnh d?c, hy th?c hnh quan h? tnh d?c an ton. S? d?ng bao cao su ho?c hnh th?c b?o v? khc ?? phng ng?a STI. Ch? s? d?ng aspirin theo ch? d?n c?a chuyn gia ch?m [place] s?c kh?e cu?a quy? vi?. Hy ch?c ch?n r?ng qu v? hi?u r c?n dng bao nhiu v dng d?ng no. Lm vi?c v?i chuyn gia ch?m [place] s?c kh?e ?? tm hi?u xem vi?c dng aspirin hng ngy c an ton v c l?i cho qu v? hay khng. Hy h?i chuyn gia ch?m  s?c kh?e xem qu v? c c?n dng thu?c h? cholesterol (statin) hay khng. Tm cc cch lnh m?nh ?? qu?n l c?ng th?ng, ch?ng h?n nh?: Thi?n, yoga, ho?c nghe nh?c. Vi?t nh?t k. Ni chuy?n v?i m?t ng??i ?ng tin c?y. Dnh th?i gian cho b?n b v gia ?nh. Gi?m thi?u ti?p xc v?i b?c x? UV ?? gi?m nguy c? ung th? da. An ton Lun th?t dy an ton trong khi li xe ho?c ng?i trn ph??ng ti?n ?i l?i. Khng li xe: N?u qu v? ? u?ng r??u. Khng ?i xe cng v?i m?t ng??i v?a u?ng r??u. Khi qu v? m?t m?i ho?c r?i tr. Trong khi nh?n tin. N?u qu v? ? s? d?ng b?t k? ch?t lm thay ??i tm tr ho?c ma ty. ??i m? b?o hi?m v cc ?? b?o v? khc trong khi tham gia ho?t ??ng th? thao. N?u qu v? c v? kh trong nh, hy ??m b?o qu v? lm theo t?t c? cc quy trnh an ton v?i sng. C?n lm g ti?p theo? ??n khm v?i chuyn gia ch?m  s?c kh?e m?i n?m m?t l?n ?? ki?m  tra s?c kh?e th??ng nin. Hy h?i chuyn gia ch?m [place] s?c kh?e v? vi?c qu v? nn khm m?t v r?ng bao lu m?t l?n. Tim v?c xin ??y ??Tera Mater tin ny khng nh?m m?c ?ch thay th? cho l?i khuyn m chuyn gia ch?m [place] s?c kh?e ni v?i qu v?. Hy b?o ??m qu v? ph?i th?o lu?n b?t k? v?n ?? g m qu v? c v?i chuyn gia ch?m [place] s?c kh?e c?a qu v?. Document Revised: 05/23/2021 Document  Reviewed: 05/23/2021 Elsevier Patient Education  [place].

## 2021-11-05 NOTE — Progress Notes (Signed)
Subjective:     Ariel Williams is a 81 y.o. female and is here for a comprehensive physical exam. The patient reports no problems.  Social History   Socioeconomic History   Marital status: Married    Spouse name: Not on file   Number of children: 9   Years of education: Not on file   Highest education level: Not on file  Occupational History   Not on file  Tobacco Use   Smoking status: Never   Smokeless tobacco: Never  Vaping Use   Vaping Use: Never used  Substance and Sexual Activity   Alcohol use: No    Alcohol/week: 0.0 standard drinks   Drug use: No   Sexual activity: Not on file  Other Topics Concern   Not on file  Social History Narrative   From [name] been 17 years lives with husband   Social Determinants of Radio broadcast assistant Strain: Not on file  Food Insecurity: Not on file  Transportation Needs: Not on file  Physical Activity: Not on file  Stress: Not on file  Social Connections: Not on file  Intimate Partner Violence: Not on file   Health Maintenance  Topic Date Due   Zoster Vaccines- Shingrix (1 of 2) Never done   COVID-19 Vaccine (5 - Booster for Pfizer series) 05/20/2021   MAMMOGRAM  04/22/2022 (Originally 01/09/2021)   TETANUS/TDAP  04/22/2022 (Originally 03/12/1960)   Pneumonia Vaccine 28+ Years old  Completed   INFLUENZA VACCINE  Completed   DEXA SCAN  Completed   HPV VACCINES  Aged Out    The following portions of the patient's history were reviewed and updated as appropriate: She  has a past medical history of Arthritis and H. pylori infection. She does not have any pertinent problems on file. She  has a past surgical history that includes Tubal ligation. Her family history includes Asthma in her brother. She  reports that she has never smoked. She has never used smokeless tobacco. She reports that she does not drink alcohol and does not use drugs. She has a current medication list which includes the following  prescription(s): ammonium lactate, gabapentin, lisinopril-hydrochlorothiazide, meloxicam, methocarbamol, multiple vitamins-minerals, NONFORMULARY OR COMPOUNDED ITEM, and rosuvastatin. Current Outpatient Medications on File Prior to Visit  Medication Sig Dispense Refill   ammonium lactate (LAC-HYDRIN) 12 % cream Apply topically as needed for dry skin. 385 g 0   gabapentin (NEURONTIN) 100 MG capsule Take 1 capsule (100 mg total) by mouth 3 (three) times daily as needed (nerve pain). 60 capsule 3   meloxicam (MOBIC) 7.5 MG tablet Take 1 tablet (7.5 mg total) by mouth daily. 30 tablet 0   methocarbamol (ROBAXIN) 500 MG tablet Take 1 tablet (500 mg total) by mouth 4 (four) times daily. 45 tablet 0   Multiple Vitamins-Minerals (WOMENS MULTIVITAMIN PO) Take 1 tablet by mouth daily.     NONFORMULARY OR COMPOUNDED ITEM bp cuff  #1  Dx htn 1 each 0   No current facility-administered medications on file prior to visit.   She is allergic to tramadol hcl..  Review of Systems Review of Systems  Constitutional: Negative for activity change, appetite change and fatigue.  HENT: Negative for hearing loss, congestion, tinnitus and ear discharge.  dentist q58m Eyes: Negative for visual disturbance (see optho q1y -- vision corrected to 20/20 with glasses).  Respiratory: Negative for cough, chest tightness and shortness of breath.   Cardiovascular: Negative for  chest pain, palpitations and leg swelling.  Gastrointestinal: Negative for abdominal pain, diarrhea, constipation and abdominal distention.  Genitourinary: Negative for urgency, frequency, decreased urine volume and difficulty urinating.  Musculoskeletal: Negative for back pain, arthralgias and gait problem.  Skin: Negative for color change, pallor and rash.  Neurological: Negative for dizziness, light-headedness, numbness and headaches.  Hematological: Negative for adenopathy. Does not bruise/bleed easily.  Psychiatric/Behavioral: Negative for suicidal  ideas, confusion, sleep disturbance, self-injury, dysphoric mood, decreased concentration and agitation.      Objective:    BP (!) 160/100 (BP Location: Left Arm, Patient Position: Sitting, Cuff Size: Normal)    Pulse 84    Temp 98.2 F (36.8 C) (Oral)    Resp 16    Ht 5' (1.524 m)    Wt 140 lb 3.2 oz (63.6 kg)    SpO2 94%    BMI 27.38 kg/m  General appearance: alert, cooperative, appears stated age, and no distress Head: Normocephalic, without obvious abnormality, atraumatic Eyes: conjunctivae/corneas clear. PERRL, EOM's intact. Fundi benign. Ears: normal TM's and external ear canals both ears Nose: Nares normal. Septum midline. Mucosa normal. No drainage or sinus tenderness. Throat: lips, mucosa, and tongue normal; teeth and gums normal Neck: no adenopathy, no carotid bruit, no JVD, supple, symmetrical, trachea midline, and thyroid not enlarged, symmetric, no tenderness/mass/nodules Lungs: clear to auscultation bilaterally Heart: regular rate and rhythm, S1, S2 normal, no murmur, click, rub or gallop Abdomen: soft, non-tender; bowel sounds normal; no masses,  no organomegaly Extremities: extremities normal, atraumatic, no cyanosis or edema Skin: Skin color, texture, turgor normal. No rashes or lesions or macular - face and mole L cheek  Lymph nodes: Cervical, supraclavicular, and axillary nodes normal. Neurologic: Alert and oriented X 3, normal strength and tone. Normal symmetric reflexes. Normal coordination and gait    Assessment:    Healthy female exam.       Plan:    Ghm utd Check labs  See After Visit Summary for Counseling Recommendations    1. Primary hypertension Poorly controlled will alter medications, encouraged DASH diet, minimize caffeine and obtain adequate sleep. Report concerning symptoms and follow up as directed and as needed  - CBC with Differential/Platelet - Comprehensive metabolic panel - Lipid panel - lisinopril-hydrochlorothiazide (ZESTORETIC) 20-25  MG tablet; Take 1 tablet by mouth daily.  Dispense: 90 tablet; Refill: 3  2. Hyperlipidemia, unspecified hyperlipidemia type Tolerating statin, encouraged heart healthy diet, avoid trans fats, minimize simple carbs and saturated fats. Increase exercise as tolerated  - CBC with Differential/Platelet - Comprehensive metabolic panel - Lipid panel - rosuvastatin (CRESTOR) 20 MG tablet; TAKE 1 TABLET(20 MG) BY MOUTH DAILY  Dispense: 90 tablet; Refill: 1  3. Preventative health care Ghm utd Check labs  Tolerating statin, encouraged heart healthy diet, avoid trans fats, minimize simple carbs and saturated fats. Increase exercise as tolerated  4. Estrogen deficiency   - DG Bone Density; Future  5. Suspicious nevus   - Ambulatory referral to Dermatology  6. Essential hypertension Poorly controlled will alter medications, encouraged DASH diet, minimize caffeine and obtain adequate sleep. Report concerning symptoms and follow up as directed and as needed   7. Hyperlipidemia LDL goal <100 Tolerating statin, encouraged heart healthy diet, avoid trans fats, minimize simple carbs and saturated fats. Increase exercise as tolerated

## 2021-11-05 NOTE — Assessment & Plan Note (Signed)
Poorly controlled will alter medications, encouraged DASH diet, minimize caffeine and obtain adequate sleep. Report concerning symptoms and follow up as directed and as needed 

## 2021-11-05 NOTE — Assessment & Plan Note (Signed)
Encourage heart healthy diet such as MIND or DASH diet, increase exercise, avoid trans fats, simple carbohydrates and processed foods, consider a krill or fish or flaxseed oil cap daily.  °

## 2021-11-05 NOTE — Assessment & Plan Note (Signed)
ghm utd Check labs  See avs  Pt will think about covid booster She prefers not to [place] mammogram but is willing to get BMD

## 2021-11-07 ENCOUNTER — Other Ambulatory Visit: Payer: Self-pay

## 2021-11-07 ENCOUNTER — Ambulatory Visit (INDEPENDENT_AMBULATORY_CARE_PROVIDER_SITE_OTHER): Payer: Medicare Other

## 2021-11-07 DIAGNOSIS — Z23 Encounter for immunization: Secondary | ICD-10-CM | POA: Diagnosis not present

## 2021-11-12 ENCOUNTER — Other Ambulatory Visit: Payer: Self-pay | Admitting: Family Medicine

## 2021-11-12 DIAGNOSIS — E785 Hyperlipidemia, unspecified: Secondary | ICD-10-CM

## 2021-11-13 ENCOUNTER — Other Ambulatory Visit: Payer: Self-pay | Admitting: *Deleted

## 2021-11-13 MED ORDER — ROSUVASTATIN CALCIUM 40 MG PO TABS: 40.0000 mg | ORAL_TABLET | Freq: Every day | ORAL | 3 refills | Status: DC

## 2021-11-19 ENCOUNTER — Ambulatory Visit (INDEPENDENT_AMBULATORY_CARE_PROVIDER_SITE_OTHER): Payer: Medicare Other | Admitting: Family Medicine

## 2021-11-19 ENCOUNTER — Encounter: Payer: Self-pay | Admitting: Family Medicine

## 2021-11-19 DIAGNOSIS — I10 Essential (primary) hypertension: Secondary | ICD-10-CM

## 2021-11-19 NOTE — Assessment & Plan Note (Signed)
Well controlled, no changes to meds. Encouraged heart healthy diet such as the DASH diet and exercise as tolerated.  °

## 2021-11-19 NOTE — Patient Instructions (Signed)

## 2021-11-19 NOTE — Progress Notes (Addendum)
Subjective:   By signing my name below, I, [name], attest that this documentation has been prepared under the direction and in the presence of Dr. Roma Schanz, [place]. 11/19/2021    Patient ID: Ariel Williams, female    DOB: 1941-07-15, 81 y.o.   MRN: [id number]  Chief Complaint  Patient presents with   Hypertension   Follow-up    Hypertension Pertinent negatives include no blurred vision, chest pain, headaches, malaise/fatigue, palpitations or shortness of breath.  Patient is in today for a office visit. She is present with an interpretor to help assist with communication. [place] [phone] She complains of occasional pain in her arms legs while laying down sleeping at night. She is taking OTC tylenol to manage her pain and finds relief.  He blood pressure is doing well during this visit. She continues taking 20-25 mg Zestoretic daily PO and reports no new issues while taking it. She denies having any swelling in her ankles at this time.  BP Readings from Last 3 Encounters:  11/19/21 140/82  11/05/21 (!) 160/100  06/25/21 130/76   Pulse Readings from Last 3 Encounters:  11/19/21 79  11/05/21 84  06/25/21 89    Past Medical History:  Diagnosis Date   Arthritis    H. pylori infection     Past Surgical History:  Procedure Laterality Date   TUBAL LIGATION      Family History  Problem Relation Age of Onset   Asthma Brother     Social History   Socioeconomic History   Marital status: Married    Spouse name: Not on file   Number of children: 9   Years of education: Not on file   Highest education level: Not on file  Occupational History   Not on file  Tobacco Use   Smoking status: Never   Smokeless tobacco: Never  Vaping Use   Vaping Use: Never used  Substance and Sexual Activity   Alcohol use: No    Alcohol/week: 0.0 standard drinks   Drug use: No   Sexual activity: Not on file  Other Topics Concern   Not on file  Social History Narrative   From  [name] been 37 years lives with husband   Social Determinants of Radio broadcast assistant Strain: Not on file  Food Insecurity: Not on file  Transportation Needs: Not on file  Physical Activity: Not on file  Stress: Not on file  Social Connections: Not on file  Intimate Partner Violence: Not on file    Outpatient Medications Prior to Visit  Medication Sig Dispense Refill   ammonium lactate (LAC-HYDRIN) 12 % cream Apply topically as needed for dry skin. 385 g 0   gabapentin (NEURONTIN) 100 MG capsule Take 1 capsule (100 mg total) by mouth 3 (three) times daily as needed (nerve pain). 60 capsule 3   lisinopril-hydrochlorothiazide (ZESTORETIC) 20-25 MG tablet Take 1 tablet by mouth daily. 90 tablet 3   meloxicam (MOBIC) 7.5 MG tablet Take 1 tablet (7.5 mg total) by mouth daily. 30 tablet 0   methocarbamol (ROBAXIN) 500 MG tablet Take 1 tablet (500 mg total) by mouth 4 (four) times daily. 45 tablet 0   Multiple Vitamins-Minerals (WOMENS MULTIVITAMIN PO) Take 1 tablet by mouth daily.     NONFORMULARY OR COMPOUNDED ITEM bp cuff  #1  Dx htn 1 each 0   rosuvastatin (CRESTOR) 40 MG tablet Take 1 tablet (40 mg total) by mouth daily. 30 tablet 3   No facility-administered  medications prior to visit.    Allergies  Allergen Reactions   Tramadol Hcl     REACTION: blurred vision    Review of Systems  Constitutional:  Negative for fever and malaise/fatigue.  HENT:  Negative for congestion.   Eyes:  Negative for blurred vision.  Respiratory:  Negative for shortness of breath.   Cardiovascular:  Negative for chest pain, palpitations and leg swelling.  Gastrointestinal:  Negative for abdominal pain, blood in stool and nausea.  Genitourinary:  Negative for dysuria and frequency.  Musculoskeletal:  Negative for falls.       (+)occasional pain in arms and legs while laying down sleeping  Skin:  Negative for rash.  Neurological:  Negative for dizziness, loss of consciousness and headaches.   Endo/Heme/Allergies:  Negative for environmental allergies.  Psychiatric/Behavioral:  Negative for depression. The patient is not nervous/anxious.       Objective:    Physical Exam Vitals and nursing note reviewed.  Constitutional:      General: She is not in acute distress.    Appearance: Normal appearance. She is well-developed. She is not ill-appearing.  HENT:     Head: Normocephalic and atraumatic.     Right Ear: External ear normal.     Left Ear: External ear normal.  Eyes:     Extraocular Movements: Extraocular movements intact.     Conjunctiva/sclera: Conjunctivae normal.     Pupils: Pupils are equal, round, and reactive to light.  Neck:     Thyroid: No thyromegaly.     Vascular: No carotid bruit or JVD.  Cardiovascular:     Rate and Rhythm: Normal rate and regular rhythm.     Heart sounds: Normal heart sounds. No murmur heard.   No gallop.  Pulmonary:     Effort: Pulmonary effort is normal. No respiratory distress.     Breath sounds: Normal breath sounds. No wheezing or rales.  Chest:     Chest wall: No tenderness.  Musculoskeletal:     Cervical back: Normal range of motion and neck supple.  Skin:    General: Skin is warm and dry.  Neurological:     Mental Status: She is alert and oriented to person, place, and time.  Psychiatric:        Behavior: Behavior normal.        Judgment: Judgment normal.    BP 140/82 (BP Location: Right Arm, Patient Position: Sitting, Cuff Size: Normal)    Pulse 79    Temp 97.8 F (36.6 C) (Oral)    Resp 18    Ht 5' (1.524 m)    Wt 138 lb 3.2 oz (62.7 kg)    SpO2 96%    BMI 26.99 kg/m  Wt Readings from Last 3 Encounters:  11/19/21 138 lb 3.2 oz (62.7 kg)  11/05/21 140 lb 3.2 oz (63.6 kg)  06/25/21 135 lb 3.2 oz (61.3 kg)    Diabetic Foot Exam - Simple   No data filed    Lab Results  Component Value Date   WBC 5.3 11/05/2021   HGB 13.1 11/05/2021   HCT 39.1 11/05/2021   PLT 222.0 11/05/2021   GLUCOSE 90 11/05/2021    CHOL 198 11/05/2021   TRIG 217.0 (H) 11/05/2021   HDL 42.00 11/05/2021   LDLDIRECT 139.0 11/05/2021   LDLCALC 67 04/22/2021   ALT 16 11/05/2021   AST 19 11/05/2021   NA 140 11/05/2021   K 3.7 11/05/2021   CL 104 11/05/2021   CREATININE 0.68 11/05/2021  BUN 13 11/05/2021   CO2 27 11/05/2021   TSH 2.36 02/18/2016    Lab Results  Component Value Date   TSH 2.36 02/18/2016   Lab Results  Component Value Date   WBC 5.3 11/05/2021   HGB 13.1 11/05/2021   HCT 39.1 11/05/2021   MCV 88.8 11/05/2021   PLT 222.0 11/05/2021   Lab Results  Component Value Date   NA 140 11/05/2021   K 3.7 11/05/2021   CO2 27 11/05/2021   GLUCOSE 90 11/05/2021   BUN 13 11/05/2021   CREATININE 0.68 11/05/2021   BILITOT 0.9 11/05/2021   ALKPHOS 60 11/05/2021   AST 19 11/05/2021   ALT 16 11/05/2021   PROT 7.5 11/05/2021   ALBUMIN 4.5 11/05/2021   CALCIUM 9.0 11/05/2021   ANIONGAP 10 01/25/2016   GFR 82.12 11/05/2021   Lab Results  Component Value Date   CHOL 198 11/05/2021   Lab Results  Component Value Date   HDL 42.00 11/05/2021   Lab Results  Component Value Date   LDLCALC 67 04/22/2021   Lab Results  Component Value Date   TRIG 217.0 (H) 11/05/2021   Lab Results  Component Value Date   CHOLHDL 5 11/05/2021   No results found for: HGBA1C     Assessment & Plan:   Problem List Items Addressed This Visit       Unprioritized   Essential hypertension    Well controlled, no changes to meds. Encouraged heart healthy diet such as the DASH diet and exercise as tolerated.         No orders of the defined types were placed in this encounter.   I, Dr. Roma Schanz, [place], personally preformed the services described in this documentation.  All medical record entries made by the scribe were at my direction and in my presence.  I have reviewed the chart and discharge instructions (if applicable) and agree that the record reflects my personal performance and is accurate and  complete. 11/19/2021   I,[name],acting as a scribe for [name], [place].,have documented all relevant documentation on the behalf of [name], [place],as directed by  [name], [place] while in the presence of [name], [place].   [name], [place]

## 2021-12-17 ENCOUNTER — Telehealth (HOSPITAL_BASED_OUTPATIENT_CLINIC_OR_DEPARTMENT_OTHER): Payer: Self-pay

## 2022-01-14 ENCOUNTER — Ambulatory Visit (HOSPITAL_BASED_OUTPATIENT_CLINIC_OR_DEPARTMENT_OTHER)
Admission: RE | Admit: 2022-01-14 | Discharge: 2022-01-14 | Disposition: A | Discharge status: Home / Self Care | Payer: Medicare Other | Source: Ambulatory Visit | Attending: Family Medicine | Admitting: Family Medicine

## 2022-01-14 ENCOUNTER — Other Ambulatory Visit: Payer: Self-pay

## 2022-01-14 DIAGNOSIS — E2839 Other primary ovarian failure: Secondary | ICD-10-CM | POA: Insufficient documentation

## 2022-01-14 DIAGNOSIS — Z780 Asymptomatic menopausal state: Secondary | ICD-10-CM | POA: Diagnosis not present

## 2022-01-14 DIAGNOSIS — M8589 Other specified disorders of bone density and structure, multiple sites: Secondary | ICD-10-CM | POA: Diagnosis not present

## 2022-01-20 ENCOUNTER — Other Ambulatory Visit: Payer: Self-pay | Admitting: *Deleted

## 2022-01-20 MED ORDER — ALENDRONATE SODIUM 70 MG PO TABS: 70.0000 mg | ORAL_TABLET | ORAL | 3 refills | Status: DC

## 2022-02-12 ENCOUNTER — Other Ambulatory Visit (INDEPENDENT_AMBULATORY_CARE_PROVIDER_SITE_OTHER): Payer: Medicare Other

## 2022-02-12 DIAGNOSIS — E785 Hyperlipidemia, unspecified: Secondary | ICD-10-CM | POA: Diagnosis not present

## 2022-02-12 LAB — LIPID PANEL
Cholesterol: 137 mg/dL (ref 0–200)
HDL: 45.3 mg/dL (ref >39.00)
LDL Cholesterol: 62 mg/dL (ref 0–99)
NonHDL: 91.51
Total CHOL/HDL Ratio: 3
Triglycerides: 147 mg/dL (ref 0.0–149.0)
VLDL: 29.4 mg/dL (ref 0.0–40.0)

## 2022-02-12 LAB — COMPREHENSIVE METABOLIC PANEL WITH GFR
ALT: 21 U/L (ref 0–35)
AST: 25 U/L (ref 0–37)
Albumin: 4.8 g/dL (ref 3.5–5.2)
Alkaline Phosphatase: 62 U/L (ref 39–117)
BUN: 23 mg/dL (ref 6–23)
CO2: 27 meq/L (ref 19–32)
Calcium: 9.3 mg/dL (ref 8.4–10.5)
Chloride: 105 meq/L (ref 96–112)
Creatinine, Ser: 0.77 mg/dL (ref 0.40–1.20)
GFR: 72.6 mL/min
Glucose, Bld: 110 mg/dL — ABNORMAL HIGH (ref 70–99)
Potassium: 4.1 meq/L (ref 3.5–5.1)
Sodium: 141 meq/L (ref 135–145)
Total Bilirubin: 1 mg/dL (ref 0.2–1.2)
Total Protein: 7.5 g/dL (ref 6.0–8.3)

## 2022-03-25 DIAGNOSIS — L821 Other seborrheic keratosis: Secondary | ICD-10-CM | POA: Diagnosis not present

## 2022-04-01 DIAGNOSIS — H40033 Anatomical narrow angle, bilateral: Secondary | ICD-10-CM | POA: Diagnosis not present

## 2022-04-01 DIAGNOSIS — H2513 Age-related nuclear cataract, bilateral: Secondary | ICD-10-CM | POA: Diagnosis not present

## 2022-05-30 ENCOUNTER — Other Ambulatory Visit: Payer: Self-pay | Admitting: Family Medicine

## 2022-08-05 ENCOUNTER — Ambulatory Visit (INDEPENDENT_AMBULATORY_CARE_PROVIDER_SITE_OTHER): Payer: Medicare Other | Admitting: Family Medicine

## 2022-08-05 ENCOUNTER — Encounter: Payer: Self-pay | Admitting: Family Medicine

## 2022-08-05 VITALS — BP 112/80 | HR 76 | Temp 97.8°F | Resp 18 | Ht 60.0 in | Wt 144.4 lb

## 2022-08-05 DIAGNOSIS — R5383 Other fatigue: Secondary | ICD-10-CM

## 2022-08-05 DIAGNOSIS — I10 Essential (primary) hypertension: Secondary | ICD-10-CM | POA: Diagnosis not present

## 2022-08-05 DIAGNOSIS — E785 Hyperlipidemia, unspecified: Secondary | ICD-10-CM | POA: Diagnosis not present

## 2022-08-05 DIAGNOSIS — R002 Palpitations: Secondary | ICD-10-CM

## 2022-08-05 DIAGNOSIS — Z23 Encounter for immunization: Secondary | ICD-10-CM

## 2022-08-05 DIAGNOSIS — M810 Age-related osteoporosis without current pathological fracture: Secondary | ICD-10-CM

## 2022-08-05 LAB — COMPREHENSIVE METABOLIC PANEL
ALT: 14 U/L (ref 0–35)
AST: 20 U/L (ref 0–37)
Albumin: 4.6 g/dL (ref 3.5–5.2)
Alkaline Phosphatase: 64 U/L (ref 39–117)
BUN: 15 mg/dL (ref 6–23)
CO2: 25 mEq/L (ref 19–32)
Calcium: 9.1 mg/dL (ref 8.4–10.5)
Chloride: 106 mEq/L (ref 96–112)
Creatinine, Ser: 0.79 mg/dL (ref 0.40–1.20)
GFR: 70.16 mL/min (ref >60.00)
Glucose, Bld: 103 mg/dL — ABNORMAL HIGH (ref 70–99)
Potassium: 4.1 mEq/L (ref 3.5–5.1)
Sodium: 140 mEq/L (ref 135–145)
Total Bilirubin: 0.9 mg/dL (ref 0.2–1.2)
Total Protein: 7.5 g/dL (ref 6.0–8.3)

## 2022-08-05 LAB — POC URINALSYSI DIPSTICK (AUTOMATED)
Bilirubin, UA: NEGATIVE
Blood, UA: NEGATIVE
Glucose, UA: NEGATIVE
Ketones, UA: NEGATIVE
Leukocytes, UA: NEGATIVE
Nitrite, UA: NEGATIVE
Protein, UA: NEGATIVE
Spec Grav, UA: 1.015 (ref 1.010–1.025)
Urobilinogen, UA: 0.2 E.U./dL
pH, UA: 6 (ref 5.0–8.0)

## 2022-08-05 LAB — LIPID PANEL
Cholesterol: 129 mg/dL (ref 0–200)
HDL: 48 mg/dL (ref >39.00)
LDL Cholesterol: 49 mg/dL (ref 0–99)
NonHDL: 80.74
Total CHOL/HDL Ratio: 3
Triglycerides: 158 mg/dL — ABNORMAL HIGH (ref 0.0–149.0)
VLDL: 31.6 mg/dL (ref 0.0–40.0)

## 2022-08-05 LAB — CBC WITH DIFFERENTIAL/PLATELET
Basophils Absolute: 0 10*3/uL (ref 0.0–0.1)
Basophils Relative: 0.4 % (ref 0.0–3.0)
Eosinophils Absolute: 0.1 10*3/uL (ref 0.0–0.7)
Eosinophils Relative: 1.6 % (ref 0.0–5.0)
HCT: 38.9 % (ref 36.0–46.0)
Hemoglobin: 13.1 g/dL (ref 12.0–15.0)
Lymphocytes Relative: 34.1 % (ref 12.0–46.0)
Lymphs Abs: 2.2 10*3/uL (ref 0.7–4.0)
MCHC: 33.7 g/dL (ref 30.0–36.0)
MCV: 91.3 fl (ref 78.0–100.0)
Monocytes Absolute: 0.5 10*3/uL (ref 0.1–1.0)
Monocytes Relative: 7.8 % (ref 3.0–12.0)
Neutro Abs: 3.5 10*3/uL (ref 1.4–7.7)
Neutrophils Relative %: 56.1 % (ref 43.0–77.0)
Platelets: 193 10*3/uL (ref 150.0–400.0)
RBC: 4.26 Mil/uL (ref 3.87–5.11)
RDW: 13.1 % (ref 11.5–15.5)
WBC: 6.3 10*3/uL (ref 4.0–10.5)

## 2022-08-05 LAB — VITAMIN D 25 HYDROXY (VIT D DEFICIENCY, FRACTURES): VITD: 25.54 ng/mL — ABNORMAL LOW (ref 30.00–100.00)

## 2022-08-05 LAB — VITAMIN B12: Vitamin B-12: 297 pg/mL (ref 211–911)

## 2022-08-05 MED ORDER — ALENDRONATE SODIUM 70 MG PO TABS: 70.0000 mg | ORAL_TABLET | ORAL | 3 refills | Status: DC

## 2022-08-05 MED ORDER — ROSUVASTATIN CALCIUM 40 MG PO TABS: ORAL_TABLET | ORAL | 3 refills | Status: DC

## 2022-08-05 MED ORDER — LISINOPRIL-HYDROCHLOROTHIAZIDE 20-25 MG PO TABS: 1.0000 | ORAL_TABLET | Freq: Every day | ORAL | 3 refills | Status: DC

## 2022-08-05 NOTE — Patient Instructions (Signed)
Palpitations Palpitations are feelings that your heartbeat is irregular or is faster than normal. It may feel like your heart is fluttering or skipping a beat. Palpitations may be caused by many things, including smoking, caffeine, alcohol, stress, and certain medicines or drugs. Most causes of palpitations are not serious.  However, some palpitations can be a sign of a serious problem. Further tests and a thorough medical history will be done to find the cause of your palpitations. Your provider may order tests such as an ECG, labs, an echocardiogram, or an ambulatory continuous ECG monitor. Follow these instructions at home: Pay attention to any changes in your symptoms. Let your health care provider know about them. Take these actions to help manage your symptoms: Eating and drinking Follow instructions from your health care provider about eating or drinking restrictions. You may need to avoid foods and drinks that may cause palpitations. These may include: Caffeinated coffee, tea, soft drinks, and energy drinks. Chocolate. Alcohol. Diet pills. Lifestyle     Take steps to reduce your stress and anxiety. Things that can help you relax include: Yoga. Mind-body activities, such as deep breathing, meditation, or using words and images to create positive thoughts (guided imagery). Physical activity, such as swimming, jogging, or walking. Tell your health care provider if your palpitations increase with activity. If you have chest pain or shortness of breath with activity, Record not continue the activity until you are seen by your health care provider. Biofeedback. This is a method that helps you learn to use your mind to control things in your body, such as your heartbeat. Get plenty of rest and sleep. Keep a regular bed time. Luz not use drugs, including cocaine or ecstasy. [place] not use marijuana. [place] not use any products that contain nicotine or tobacco. These products include cigarettes, chewing  tobacco, and vaping devices, such as e-cigarettes. If you need help quitting, ask your health care provider. General instructions Take over-the-counter and prescription medicines only as told by your health care provider. Keep all follow-up visits. This is important. These may include visits for further testing if palpitations S[place] not go away or get worse. Contact a health care provider if: You continue to have a fast or irregular heartbeat for a long period of time. You notice that your palpitations occur more often. Get help right away if: You have chest pain or shortness of breath. You have a severe headache. You feel dizzy or you faint. These symptoms may represent a serious problem that is an emergency. [place] not wait to see if the symptoms will go away. Get medical help right away. Call your local emergency services (911 in the [place] not drive yourself to the hospital. Summary Palpitations are feelings that your heartbeat is irregular or is faster than normal. It may feel like your heart is fluttering or skipping a beat. Palpitations may be caused by many things, including smoking, caffeine, alcohol, stress, certain medicines, and drugs. Further tests and a thorough medical history may be done to find the cause of your palpitations. Get help right away if you faint or have chest pain, shortness of breath, severe headache, or dizziness. This information is not intended to replace advice given to you by your health care provider. Make sure you discuss any questions you have with your health care provider. Document Revised: 03/13/2021 Document Reviewed: 03/13/2021 Elsevier Patient Education  [place] tr?ng ng?c Palpitations ?nh tr?ng ng?c l c?m gic nh?p ??p c?a tim qu v? khng ??  u ho?c nhanh h?n bnh th??ng. Qu v? c th? c?m th?y nh? tim qu v? rung ln ho?c b? nh?p. ?nh tr?ng ng?c co? th? l [place] nhi?u y?u t?, bao g?m hu?t thu?c, caffeine, r???u, c?ng th??ng va?  m?t s? loa?i thu?c ho?c ma ty nh?t ??nh, gy ra. H?u h?t cc nguyn nhn gy ?nh tr?ng ng?c khng nghim tr?ng.  Tuy nhin, m?t s? tnh tr?ng ?nh tr?ng ng?c c th? l d?u hi?u c?a m?t v?n ?? nghim tr?ng. Cc xt nghi?m b? sung v h?i b?nh s? k? l??ng s? ???c th?c hi?n ?? tm ra nguyn nhn gy ?nh tr?ng ng?c. Chuyn gia ch?m [place] c?a qu v? c th? yu c?u cc ki?m tra nh? ECG, xt nghi?m, siu m tim ho?c theo di ECG lin t?c l?u ??ng. Tun th? nh?ng h??ng d?n ny ? nh: Ch  ??n b?t c? thay ??i no v? tri?u ch?ng c?a qu v?. Hy cho chuyn gia ch?m [place] s?c kh?e bi?t v? nh?ng thay ??i ?. Th??c hi?n nh?ng hnh ??ng ny ?? gip x? tr cc tri?u ch?ng c?a qu v?: ?n v u?ng Tun th? ch? d?n c?a chuyn gia ch?m [place] s?c kh?e v? cc h?n ch? ?n ho?c u?ng. Qu v? c th? c?n trnh cc th?c ?n v ?? u?ng c th? gy ?nh tr?ng ng?c. Nh?ng th? ny c th? bao g?m: C ph, tr, n??c gi?i kht v ?? u?ng giu n?ng l??ng c caffeine. S-c-la. R??u. Thu?c gi?m cn. L?i s?ng     Th?c hi?n cc b??c ?? gi?m c?ng th?ng v lo l?ng. Nh?ng th? c th? gip qu v? th? gin bao g?m: Yoga. Cc ho?t ??ng k?t h?p tm tr-c? th?, ch?ng h?n nh? th? su, thi?n ??nh, ho?c s? d?ng t? ng? v hnh ?nh ?? t?o suy ngh? tch c?c (t??ng t??ng theo d?n h??ng). Ho?t ??ng th? ch?t nh? b?i l?i, ch?y b? ho?c ?i b?. Cho chuyn gia ch?m [place] s?c kh?e bi?t n?u qu v? b? ?nh tr?ng ng?c t?ng ln khi ho?t ??ng. N?u qu v? b? ?au ng?c ho?c th? d?c khi ho?t ??ng, khng ti?p t?c ho?t ??ng ? cho ??n khi qu v? ? ???c chuyn gia ch?m [place] s?c kh?e khm. Pha?n h?i sinh ho?c. ?y la? m?t ph??ng pha?p giu?p quy? vi? ho?c ca?ch s?? du?ng tm tri? c?a mi?nh ?? ki?m soa?t ca?c y?u t? trong c? th?, ch??ng ha?n nh? nhi?p ??p c?a tim. Ngh? ng?i v ng? th?t nhi?u. Duy tr th?i gian ng? ??u ??n. Khng s? d?ng ma ty, bao g?m cocaine ho?c ecstasy (thu?c l?c). Khng s? d?ng marijuana (c?n sa). Khng s? d?ng b?t k? s?n ph?m no c nicotine ho?c  thu?c l. Nh?ng s?n ph?m ny bao g?m thu?c l d?ng ht, thu?c l d?ng nhai v d?ng c? ht thu?c, ch?ng h?n nh? thu?c l ?i?n t?. N?u qu v? c?n gip ?? ?? cai thu?c, hy h?i chuyn gia ch?m [place] s?c kh?e. H??ng d?n chung Ch? s? d?ng thu?c khng k ??n v thu?c k ??n theo ch? d?n c?a chuyn gia ch?m [place] s?c kh?e. Tun th? theo t?t c? cc l?n khm l?i. ?i?u ny c vai tr quan tr?ng. Nh?ng l?n khm ny c th? bao g?m khm ?? ki?m tra thm n?u ?nh tr?ng ng?c khng h?t ho?c tr?m tr?ng h?n. Hy lin l?c v?i chuyn gia ch?m [place] s?c kh?e n?u: Qu v? ti?p t?c c nh?p tim nhanh ho?c nh?p tim khng ??u trong m?t th?i gian di. Qu v?  nh?n th?y r?ng tnh tr?ng ?nh tr?ng ng?c c?a qu v? x?y ra th??ng xuyn h?n. Yu c?u tr? gip ngay l?p t?c n?u: Qu v? b? ?au ng?c ho?c kh th?. Qu v? b? ?au ??u d? d?i. Qu v? c?m th?y chng m?t ho?c ng?t x?u. Nh?ng tri?u ch?ng ny c th? l bi?u hi?n c?a m?t v?n ?? nghim tr?ng c?n c?p c?u. Khng ch? xem tri?u ch?ng c h?t khng. Hy ?i khm ngay l?p t?c. G?i cho d?ch v? c?p c?u t?i ??a ph??ng (911 ? [place] Khng t? li xe ??n b?nh vi?n. Tm t?t ?nh tr?ng ng?c l c?m gic nh?p ??p c?a tim qu v? khng ??u ho?c nhanh h?n bnh th??ng. Qu v? c th? c?m th?y nh? tim qu v? rung ln ho?c b? nh?p. ?nh tr?ng ng?c co? th? l [place] nhi?u y?u t?, bao g?m c? hu?t thu?c, caffeine, r???u, c?ng th??ng va? m?t s? loa?i thu?c ho?c ma ty nh?t ??nh, gy ra. Cc xt nghi?m b? sung v h?i b?nh s? k? l??ng c th? ???c th?c hi?n ?? tm ra nguyn nhn gy ra ?nh tr?ng ng?c. Tm s? tr? gip ngay l?p t?c n?u qu v? b? ng?t x?u ho?c ?au ng?c, kh th?, ?au ??u d? d?i, ho?c chng m?t. Thng tin ny khng nh?m m?c ?ch thay th? cho l?i khuyn m chuyn gia ch?m [place] s?c kh?e ni v?i qu v?. Hy b?o ??m qu v? ph?i th?o lu?n b?t k? v?n ?? g m qu v? c v?i chuyn gia ch?m [place] s?c kh?e c?a qu v?. Document Revised: 03/29/2021 Document Reviewed: 03/29/2021 Elsevier Patient Education  [place].

## 2022-08-05 NOTE — Assessment & Plan Note (Signed)
Ectopic beats on ekg Check labs  [place] heart  Refer to card

## 2022-08-05 NOTE — Progress Notes (Addendum)
Subjective:   By signing my name below, I, Ariel Williams, attest that this documentation has been prepared under the direction and in the presence of Ariel Williams, [place]  08/05/2022    Patient ID: Ariel Williams, female    DOB: 04-Nov-1940, 81 y.o.   MRN: [id number]  Chief Complaint  Patient presents with   Hypertension   Hyperlipidemia   Follow-up    Hypertension Associated symptoms include chest pain, malaise/fatigue and palpitations. Pertinent negatives include no shortness of breath.  Hyperlipidemia Associated symptoms include chest pain. Pertinent negatives include no shortness of breath.   Patient is in today for a office visit. She is present with an in person vietnamese to Nutritional therapist.   She complains of fatigue. She also complains of heart palpitations for the past 2-4 weeks. She is going to bed at the same time at 10 pm every day but she wakes up at 2 am from her heart palpitations and cannot fall back asleep. They typically occur at night and occasionally during the day time and can last up to 20 minutes. She denies sweatiness, swelling in her legs or SOB while having palpitations but she does experience occasional fatigue in her extremities. She no longer takes gabapentin and is interested in taking it again to manage her sleep and leg and arm pain. She notes her leg and arm pain have improved but she continues having mild pain in her extremities.   She is requesting a refill for 70 mg fosamax, 20-25 mg lisinopril-hydrochlorothiazide, 40 mg rosuvastatin. Her blood pressure is doing well during this visit. She continues taking 20-25 mg lisinopril-hydrochlorothiazide daily PO and reports no new issues while taking it.  BP Readings from Last 3 Encounters:  08/05/22 112/80  11/19/21 140/82  11/05/21 (!) 160/100   Pulse Readings from Last 3 Encounters:  08/05/22 76  11/19/21 79  11/05/21 84   She stop taking meloxicam a while ago. She is no longer taking her muscle  relaxer medication regularly.  She is interested in receiving the flu vaccine during this visit.    Past Medical History:  Diagnosis Date   Arthritis    H. pylori infection     Past Surgical History:  Procedure Laterality Date   TUBAL LIGATION      Family History  Problem Relation Age of Onset   Asthma Brother     Social History   Socioeconomic History   Marital status: Married    Spouse name: Not on file   Number of children: 9   Years of education: Not on file   Highest education level: Not on file  Occupational History   Not on file  Tobacco Use   Smoking status: Never   Smokeless tobacco: Never  Vaping Use   Vaping Use: Never used  Substance and Sexual Activity   Alcohol use: No    Alcohol/week: 0.0 standard drinks of alcohol   Drug use: No   Sexual activity: Not on file  Other Topics Concern   Not on file  Social History Narrative   From [name] been 46 years lives with husband   Social Determinants of Radio broadcast assistant Strain: Not on file  Food Insecurity: Not on file  Transportation Needs: Not on file  Physical Activity: Not on file  Stress: Not on file  Social Connections: Not on file  Intimate Partner Violence: Not on file    Outpatient Medications Prior to Visit  Medication Sig Dispense Refill  ammonium lactate (LAC-HYDRIN) 12 % cream Apply topically as needed for dry skin. 385 g 0   meloxicam (MOBIC) 7.5 MG tablet Take 1 tablet (7.5 mg total) by mouth daily. 30 tablet 0   methocarbamol (ROBAXIN) 500 MG tablet Take 1 tablet (500 mg total) by mouth 4 (four) times daily. 45 tablet 0   Multiple Vitamins-Minerals (WOMENS MULTIVITAMIN PO) Take 1 tablet by mouth daily.     NONFORMULARY OR COMPOUNDED ITEM bp cuff  #1  Dx htn 1 each 0   alendronate (FOSAMAX) 70 MG tablet Take 1 tablet (70 mg total) by mouth every 7 (seven) days. Take with a full glass of water on an empty stomach. 12 tablet 3   gabapentin (NEURONTIN) 100 MG capsule Take 1  capsule (100 mg total) by mouth 3 (three) times daily as needed (nerve pain). 60 capsule 3   lisinopril-hydrochlorothiazide (ZESTORETIC) 20-25 MG tablet Take 1 tablet by mouth daily. 90 tablet 3   rosuvastatin (CRESTOR) 40 MG tablet TAKE 1 TABLET(40 MG) BY MOUTH DAILY 30 tablet 3   No facility-administered medications prior to visit.    Allergies  Allergen Reactions   Tramadol Hcl     REACTION: blurred vision    Review of Systems  Constitutional:  Positive for malaise/fatigue. Negative for diaphoresis.  Respiratory:  Negative for shortness of breath.   Cardiovascular:  Positive for chest pain and palpitations. Negative for leg swelling.       Objective:    Physical Exam Constitutional:      General: She is not in acute distress.    Appearance: Normal appearance. She is not ill-appearing.  HENT:     Head: Normocephalic and atraumatic.     Right Ear: External ear normal.     Left Ear: External ear normal.  Eyes:     Extraocular Movements: Extraocular movements intact.     Pupils: Pupils are equal, round, and reactive to light.  Cardiovascular:     Rate and Rhythm: Normal rate and regular rhythm.     Heart sounds: Normal heart sounds. No murmur heard.    No gallop.  Pulmonary:     Effort: Pulmonary effort is normal. No respiratory distress.     Breath sounds: Normal breath sounds. No wheezing or rales.  Musculoskeletal:     Right lower leg: No edema.     Left lower leg: No edema.  Skin:    General: Skin is warm and dry.  Neurological:     Mental Status: She is alert and oriented to person, place, and time.  Psychiatric:        Judgment: Judgment normal.     BP 112/80 (BP Location: Left Arm, Patient Position: Sitting, Cuff Size: Normal)   Pulse 76   Temp 97.8 F (36.6 C) (Oral)   Resp 18   Ht 5' (1.524 m)   Wt 144 lb 6.4 oz (65.5 kg)   SpO2 98%   BMI 28.20 kg/m  Wt Readings from Last 3 Encounters:  08/05/22 144 lb 6.4 oz (65.5 kg)  11/19/21 138 lb 3.2 oz  (62.7 kg)  11/05/21 140 lb 3.2 oz (63.6 kg)    Diabetic Foot Exam - Simple   No data filed    Lab Results  Component Value Date   WBC 5.3 11/05/2021   HGB 13.1 11/05/2021   HCT 39.1 11/05/2021   PLT 222.0 11/05/2021   GLUCOSE 110 (H) 02/12/2022   CHOL 137 02/12/2022   TRIG 147.0 02/12/2022   HDL 45.30  02/12/2022   LDLDIRECT 139.0 11/05/2021   LDLCALC 62 02/12/2022   ALT 21 02/12/2022   AST 25 02/12/2022   NA 141 02/12/2022   K 4.1 02/12/2022   CL 105 02/12/2022   CREATININE 0.77 02/12/2022   BUN 23 02/12/2022   CO2 27 02/12/2022   TSH 2.36 02/18/2016    Lab Results  Component Value Date   TSH 2.36 02/18/2016   Lab Results  Component Value Date   WBC 5.3 11/05/2021   HGB 13.1 11/05/2021   HCT 39.1 11/05/2021   MCV 88.8 11/05/2021   PLT 222.0 11/05/2021   Lab Results  Component Value Date   NA 141 02/12/2022   K 4.1 02/12/2022   CO2 27 02/12/2022   GLUCOSE 110 (H) 02/12/2022   BUN 23 02/12/2022   CREATININE 0.77 02/12/2022   BILITOT 1.0 02/12/2022   ALKPHOS 62 02/12/2022   AST 25 02/12/2022   ALT 21 02/12/2022   PROT 7.5 02/12/2022   ALBUMIN 4.8 02/12/2022   CALCIUM 9.3 02/12/2022   ANIONGAP 10 01/25/2016   GFR 72.60 02/12/2022   Lab Results  Component Value Date   CHOL 137 02/12/2022   Lab Results  Component Value Date   HDL 45.30 02/12/2022   Lab Results  Component Value Date   LDLCALC 62 02/12/2022   Lab Results  Component Value Date   TRIG 147.0 02/12/2022   Lab Results  Component Value Date   CHOLHDL 3 02/12/2022   No results found for: "HGBA1C"     Assessment & Plan:   Problem List Items Addressed This Visit       Unprioritized   Fatigue   Relevant Orders   ECHOCARDIOGRAM COMPLETE   Palpitations    Ectopic beats on ekg Check labs  [place] heart  Refer to card       Relevant Orders   EKG 12-Lead (Completed)   Thyroid Panel With TSH   Vitamin B12   VITAMIN D 25 Hydroxy (Vit-D Deficiency, Fractures)   POCT  Urinalysis Dipstick (Automated) (Completed)   ECHOCARDIOGRAM COMPLETE   Cardiac event monitor   Hyperlipidemia LDL goal <100    Encourage heart healthy diet such as MIND or DASH diet, increase exercise, avoid trans fats, simple carbohydrates and processed foods, consider a krill or fish or flaxseed oil cap daily.       Relevant Medications   lisinopril-hydrochlorothiazide (ZESTORETIC) 20-25 MG tablet   rosuvastatin (CRESTOR) 40 MG tablet   Essential hypertension - Primary    Well controlled, no changes to meds. Encouraged heart healthy diet such as the DASH diet and exercise as tolerated.       Relevant Medications   lisinopril-hydrochlorothiazide (ZESTORETIC) 20-25 MG tablet   rosuvastatin (CRESTOR) 40 MG tablet   Other Relevant Orders   CBC with Differential/Platelet   Comprehensive metabolic panel   Lipid panel   Vitamin B12   VITAMIN D 25 Hydroxy (Vit-D Deficiency, Fractures)   POCT Urinalysis Dipstick (Automated) (Completed)   ECHOCARDIOGRAM COMPLETE   Age-related osteoporosis without current pathological fracture   Relevant Medications   alendronate (FOSAMAX) 70 MG tablet   Other Visit Diagnoses     Hyperlipidemia, unspecified hyperlipidemia type       Relevant Medications   lisinopril-hydrochlorothiazide (ZESTORETIC) 20-25 MG tablet   rosuvastatin (CRESTOR) 40 MG tablet   Other Relevant Orders   CBC with Differential/Platelet   Comprehensive metabolic panel   Lipid panel   Vitamin B12   VITAMIN D 25 Hydroxy (Vit-D Deficiency, Fractures)  POCT Urinalysis Dipstick (Automated) (Completed)   ECHOCARDIOGRAM COMPLETE   Primary hypertension       Relevant Medications   lisinopril-hydrochlorothiazide (ZESTORETIC) 20-25 MG tablet   rosuvastatin (CRESTOR) 40 MG tablet   Other Relevant Orders   Vitamin B12   VITAMIN D 25 Hydroxy (Vit-D Deficiency, Fractures)   POCT Urinalysis Dipstick (Automated) (Completed)   ECHOCARDIOGRAM COMPLETE   Need for influenza vaccination        Relevant Orders   Flu Vaccine QUAD High Dose(Fluad) (Completed)        Meds ordered this encounter  Medications   alendronate (FOSAMAX) 70 MG tablet    Sig: Take 1 tablet (70 mg total) by mouth every 7 (seven) days. Take with a full glass of water on an empty stomach.    Dispense:  12 tablet    Refill:  3   lisinopril-hydrochlorothiazide (ZESTORETIC) 20-25 MG tablet    Sig: Take 1 tablet by mouth daily.    Dispense:  90 tablet    Refill:  3   rosuvastatin (CRESTOR) 40 MG tablet    Sig: TAKE 1 TABLET(40 MG) BY MOUTH DAILY    Dispense:  90 tablet    Refill:  3    I, Ariel Williams, [place], personally preformed the services described in this documentation.  All medical record entries made by the scribe were at my direction and in my presence.  I have reviewed the chart and discharge instructions (if applicable) and agree that the record reflects my personal performance and is accurate and complete. 08/05/2022   I,Ariel Williams,acting as a scribe for Ariel Williams, [place].,have documented all relevant documentation on the behalf of Ariel Williams, [place],as directed by  Ariel Williams, [place] while in the presence of Ariel Williams, [place].   Ariel Williams, [place]

## 2022-08-05 NOTE — Assessment & Plan Note (Signed)
Well controlled, no changes to meds. Encouraged heart healthy diet such as the DASH diet and exercise as tolerated.  °

## 2022-08-05 NOTE — Assessment & Plan Note (Signed)
Encourage heart healthy diet such as MIND or DASH diet, increase exercise, avoid trans fats, simple carbohydrates and processed foods, consider a krill or fish or flaxseed oil cap daily.  °

## 2022-08-06 ENCOUNTER — Encounter: Payer: Self-pay | Admitting: *Deleted

## 2022-08-06 LAB — THYROID PANEL WITH TSH
Free Thyroxine Index: 2 (ref 1.4–3.8)
T3 Uptake: 31 % (ref 22–35)
T4, Total: 6.5 ug/dL (ref 5.1–11.9)
TSH: 3.43 mIU/L (ref 0.40–4.50)

## 2022-08-06 NOTE — Progress Notes (Signed)
Patient ID: Ariel Williams, female   DOB: 02/05/1941, 81 y.o.   MRN: [id number] Patient enrolled for [employer] to ship a 30 day cardiac event monitor to her address on file.  Letter with instructions, ([place] and [place]), mailed to patient.  DOD to read.

## 2022-08-13 ENCOUNTER — Ambulatory Visit (HOSPITAL_BASED_OUTPATIENT_CLINIC_OR_DEPARTMENT_OTHER)
Admission: RE | Admit: 2022-08-13 | Discharge: 2022-08-13 | Disposition: A | Discharge status: Home / Self Care | Payer: Medicare Other | Source: Ambulatory Visit | Attending: Family Medicine | Admitting: Family Medicine

## 2022-08-13 DIAGNOSIS — E785 Hyperlipidemia, unspecified: Secondary | ICD-10-CM

## 2022-08-13 DIAGNOSIS — R079 Chest pain, unspecified: Secondary | ICD-10-CM

## 2022-08-13 DIAGNOSIS — R5383 Other fatigue: Secondary | ICD-10-CM

## 2022-08-13 DIAGNOSIS — I10 Essential (primary) hypertension: Secondary | ICD-10-CM

## 2022-08-13 DIAGNOSIS — R002 Palpitations: Secondary | ICD-10-CM | POA: Diagnosis not present

## 2022-08-13 LAB — ECHOCARDIOGRAM COMPLETE
Area-P 1/2: 4.15 cm2
S' Lateral: 2.3 cm

## 2022-08-13 NOTE — Progress Notes (Signed)
  Echocardiogram 2D Echocardiogram has been performed.  [name] 08/13/2022, 10:52 AM

## 2022-08-18 ENCOUNTER — Telehealth: Payer: Self-pay

## 2022-08-18 DIAGNOSIS — R002 Palpitations: Secondary | ICD-10-CM

## 2022-08-18 NOTE — Telephone Encounter (Signed)
Referral has been sent. Please double check that the dx code is correct.

## 2022-08-22 ENCOUNTER — Ambulatory Visit: Payer: Medicare Other | Attending: Cardiovascular Disease

## 2022-08-22 ENCOUNTER — Encounter: Payer: Self-pay | Admitting: Cardiovascular Disease

## 2022-08-22 ENCOUNTER — Ambulatory Visit: Payer: Medicare Other

## 2022-08-22 ENCOUNTER — Ambulatory Visit: Payer: Medicare Other | Attending: Cardiovascular Disease | Admitting: Cardiovascular Disease

## 2022-08-22 VITALS — BP 146/82 | HR 67 | Ht 60.0 in | Wt 144.0 lb

## 2022-08-22 DIAGNOSIS — I493 Ventricular premature depolarization: Secondary | ICD-10-CM

## 2022-08-22 DIAGNOSIS — R002 Palpitations: Secondary | ICD-10-CM

## 2022-08-22 MED ORDER — METOPROLOL SUCCINATE ER 25 MG PO TB24: 25.0000 mg | ORAL_TABLET | Freq: Every day | ORAL | 3 refills | Status: DC

## 2022-08-22 NOTE — Patient Instructions (Signed)
Medication Instructions:  START Metoprolol Succinate 25 mg daily   *If you need a refill on your cardiac medications before your next appointment, please call your pharmacy*   Testing/Procedures: [place] Monitor Instructions  Your physician has requested you wear a ZIO patch monitor for 7 days.  This is a single patch monitor. [employer] supplies one patch monitor per enrollment. Additional stickers are not available. Please Ess[place] not apply patch if you will be having a Nuclear Stress Test,  Echocardiogram, Cardiac CT, MRI, or Chest Xray during the period you would be wearing the  monitor. The patch cannot be worn during these tests. You cannot remove and re-apply the  ZIO XT patch monitor.  Your ZIO patch monitor will be mailed 3 day USPS to your address on file. It may take 3-5 days  to receive your monitor after you have been enrolled.  Once you have received your monitor, please review the enclosed instructions. Your monitor  has already been registered assigning a specific monitor serial # to you.  Billing and Patient Assistance Program Information  We have supplied [employer] with any of your insurance information on file for billing purposes. [employer] offers a sliding scale Patient Assistance Program for patients that [place] not have  insurance, or whose insurance does not completely cover the cost of the ZIO monitor.  You must apply for the Patient Assistance Program to qualify for this discounted rate.  To apply, please call [employer] at [phone], select option 4, select option 2, ask to apply for  Patient Assistance Program. Theodore Demark will ask your household income, and how many people  are in your household. They will quote your out-of-pocket cost based on that information.  [employer] will also be able to set up a 48-month interest-free payment plan if needed.  Applying the monitor   Shave hair from upper left chest.  Hold abrader disc by orange tab. Rub abrader in 40 strokes  over the upper left chest as  indicated in your monitor instructions.  Clean area with 4 enclosed alcohol pads. Let dry.  Apply patch as indicated in monitor instructions. Patch will be placed under collarbone on left  side of chest with arrow pointing upward.  Rub patch adhesive wings for 2 minutes. Remove white label marked "1". Remove the white  label marked "2". Rub patch adhesive wings for 2 additional minutes.  While looking in a mirror, press and release button in center of patch. A small green light will  flash 3-4 times. This will be your only indicator that the monitor has been turned on.  [place] not shower for the first 24 hours. You may shower after the first 24 hours.  Press the button if you feel a symptom. You will hear a small click. Record Date, Time and  Symptom in the Patient Logbook.  When you are ready to remove the patch, follow instructions on the last 2 pages of Patient  Logbook. Stick patch monitor onto the last page of Patient Logbook.  Place Patient Logbook in the blue and white box. Use locking tab on box and tape box closed  securely. The blue and white box has prepaid postage on it. Please place it in the mailbox as  soon as possible. Your physician should have your test results approximately 7 days after the  monitor has been [facility]  Call [place] [phone] you have questions regarding  your ZIO XT patch monitor. Call them immediately  if you see an orange light blinking on your  monitor.  If your monitor falls off in less than 4 days, contact our Monitor department at [phone].  If your monitor becomes loose or falls off after 4 days call [employer] at [phone] for  suggestions on securing your monitor    Follow-Up: At [facility], you and your health needs are our priority.  As part of our continuing mission to provide you with exceptional heart care, we have created designated Provider Care  Teams.  These Care Teams include your primary Cardiologist (physician) and Advanced Practice Providers (APPs -  Physician Assistants and Nurse Practitioners) who all work together to provide you with the care you need, when you need it.  We recommend signing up for the patient portal called "MyChart".  Sign up information is provided on this After Visit Summary.  MyChart is used to connect with patients for Virtual Visits (Telemedicine).  Patients are able to view lab/test results, encounter notes, upcoming appointments, etc.  Non-urgent messages can be sent to your provider as well.   To learn more about what you can [place] with MyChart, go to [email or web address].    Your next appointment:   3-4 month(s)  The format for your next appointment:   In Person  Provider:   Eleonore Chiquito, MD

## 2022-08-22 NOTE — Progress Notes (Signed)
Cardiology Office Note:   Date:  08/22/2022  NAME:  Ariel Williams    MRN: [id number] DOB:  October 17, 1941   PCP:  [name], [place]  Cardiologist:  None  Electrophysiologist:  None   Referring MD: Carollee Herter, Alferd Apa, *   Chief Complaint  Patient presents with   Palpitations    History of Present Illness:   Ariel Williams is a 81 y.o. female with a hx of HTN, HLD who is being seen today for the evaluation of palpitations at the request of [name], [place].  She reports for the last year she has had palpitations.  Symptoms occur with activity.  They Thelin not occur at rest.  They are happening more frequently.  Any strenuous activity can result in her heart beating fast.  It stops when she slows down.  She was seen by her primary care physician and noted to have PVCs on EKG.  Her examination is normal.  No PVCs today.  She had an echocardiogram that was normal.  She reports no chest pain or trouble breathing.  She does feels her heart racing.  She does have hypertension but her blood pressure is well controlled.  Thyroid studies are normal.  She is not anemic.  She has never had a heart attack or stroke.  No family history of heart disease.  Examination unremarkable.  EKG from primary care physician really unremarkable other than PVCs.  She does not smoke.  No drug use.  She is married.  She has 5 living children.  B12 297 TSH 3.43  Problem List HTN HLD -T chol 129, HDL 48, LDL 49, TG 158  Past Medical History: Past Medical History:  Diagnosis Date   Arthritis    H. pylori infection    Hypertension     Past Surgical History: Past Surgical History:  Procedure Laterality Date   CESAREAN SECTION     TUBAL LIGATION      Current Medications: Current Meds  Medication Sig   ammonium lactate (LAC-HYDRIN) 12 % cream Apply topically as needed for dry skin.   lisinopril-hydrochlorothiazide (ZESTORETIC) 20-25 MG tablet Take 1 tablet by mouth daily.   meloxicam (MOBIC) 7.5 MG  tablet Take 1 tablet (7.5 mg total) by mouth daily.   methocarbamol (ROBAXIN) 500 MG tablet Take 1 tablet (500 mg total) by mouth 4 (four) times daily.   metoprolol succinate (TOPROL XL) 25 MG 24 hr tablet Take 1 tablet (25 mg total) by mouth daily.   Multiple Vitamins-Minerals (WOMENS MULTIVITAMIN PO) Take 1 tablet by mouth daily.   NONFORMULARY OR COMPOUNDED ITEM bp cuff  #1  Dx htn   rosuvastatin (CRESTOR) 40 MG tablet TAKE 1 TABLET(40 MG) BY MOUTH DAILY     Allergies:    Tramadol hcl   Social History: Social History   Socioeconomic History   Marital status: Married    Spouse name: Not on file   Number of children: 9   Years of education: Not on file   Highest education level: Not on file  Occupational History   Occupation: Retired  Tobacco Use   Smoking status: Never   Smokeless tobacco: Never  Vaping Use   Vaping Use: Never used  Substance and Sexual Activity   Alcohol use: No    Alcohol/week: 0.0 standard drinks of alcohol   Drug use: No   Sexual activity: Not on file  Other Topics Concern   Not on file  Social History Narrative  From [name] been 20 years lives with husband   Social Determinants of Radio broadcast assistant Strain: Not on file  Food Insecurity: Not on file  Transportation Needs: Not on file  Physical Activity: Not on file  Stress: Not on file  Social Connections: Not on file     Family History: The patient's family history includes Asthma in her brother.  ROS:   All other ROS reviewed and negative. Pertinent positives noted in the HPI.     EKGs/Labs/Other Studies Reviewed:   The following studies were personally reviewed by me today:  EKG: EKG reviewed from primary care physician office on 08/05/2022.  The EKG demonstrates sinus rhythm with frequent PVCs  TTE 08/13/2022  1. Left ventricular ejection fraction, by estimation, is 60 to 65%. The  left ventricle has normal function. The left ventricle has no regional  wall motion  abnormalities. Left ventricular diastolic parameters are  consistent with Grade I diastolic  dysfunction (impaired relaxation).   2. Right ventricular systolic function is normal. The right ventricular  size is normal. There is normal pulmonary artery systolic pressure.   3. The mitral valve is normal in structure. No evidence of mitral valve  regurgitation. No evidence of mitral stenosis.   4. The aortic valve is normal in structure. Aortic valve regurgitation is  mild. Aortic valve sclerosis is present, with no evidence of aortic valve  stenosis.   5. The inferior vena cava is normal in size with greater than 50%  respiratory variability, suggesting right atrial pressure of 3 mmHg.   Recent Labs: 08/05/2022: ALT 14; BUN 15; Creatinine, Ser 0.79; Hemoglobin 13.1; Platelets 193.0; Potassium 4.1; Sodium 140; TSH 3.43   Recent Lipid Panel    Component Value Date/Time   CHOL 129 08/05/2022 0934   TRIG 158.0 (H) 08/05/2022 0934   HDL 48.00 08/05/2022 0934   CHOLHDL 3 08/05/2022 0934   VLDL 31.6 08/05/2022 0934   LDLCALC 49 08/05/2022 0934   LDLCALC 142 (H) 01/04/2021 1537   LDLDIRECT 139.0 11/05/2021 1038    Physical Exam:   VS:  BP (!) 146/82   Pulse 67   Ht 5' (1.524 m)   Wt 144 lb (65.3 kg)   SpO2 97%   BMI 28.12 kg/m    Wt Readings from Last 3 Encounters:  08/22/22 144 lb (65.3 kg)  08/05/22 144 lb 6.4 oz (65.5 kg)  11/19/21 138 lb 3.2 oz (62.7 kg)    General: Well nourished, well developed, in no acute distress Head: Atraumatic, normal size  Eyes: PEERLA, EOMI  Neck: Supple, no JVD Endocrine: No thryomegaly Cardiac: Normal S1, S2; RRR; no murmurs, rubs, or gallops Lungs: Clear to auscultation bilaterally, no wheezing, rhonchi or rales  Abd: Soft, nontender, no hepatomegaly  Ext: No edema, pulses 2+ Musculoskeletal: No deformities, BUE and BLE strength normal and equal Skin: Warm and dry, no rashes   Neuro: Alert and oriented to person, place, time, and situation,  CNII-XII grossly intact, no focal deficits  Psych: Normal mood and affect   ASSESSMENT:   Ariel Williams is a 81 y.o. female who presents for the following: 1. Palpitations   2. PVC (premature ventricular contraction)     PLAN:   1. Palpitations 2. PVC (premature ventricular contraction) -She reports exertional palpitations.  EKG from primary care physician office demonstrates PVCs.  Recent echo normal.  Recent thyroid study normal.  Recent lab work also normal.  Symptoms could be PVC related.  We will go ahead  and start her on metoprolol succinate 25 mg daily.  I would also like for her to wear a 7-day ZIO to exclude other arrhythmias.  She will see me back in 3 to 4 months after the above work-up.  Disposition: Return in about 3 months (around 11/22/2022).  Medication Adjustments/Labs and Tests Ordered: Current medicines are reviewed at length with the patient today.  Concerns regarding medicines are outlined above.  Orders Placed This Encounter  Procedures   LONG TERM MONITOR (3-14 DAYS)   Meds ordered this encounter  Medications   metoprolol succinate (TOPROL XL) 25 MG 24 hr tablet    Sig: Take 1 tablet (25 mg total) by mouth daily.    Dispense:  90 tablet    Refill:  3    Patient Instructions  Medication Instructions:  START Metoprolol Succinate 25 mg daily   *If you need a refill on your cardiac medications before your next appointment, please call your pharmacy*   Testing/Procedures: [facility] Monitor Instructions  Your physician has requested you wear a ZIO patch monitor for 7 days.  This is a single patch monitor. [employer] supplies one patch monitor per enrollment. Additional stickers are not available. Please Feenstra not apply patch if you will be having a Nuclear Stress Test,  Echocardiogram, Cardiac CT, MRI, or Chest Xray during the period you would be wearing the  monitor. The patch cannot be worn during these tests. You cannot remove and re-apply the  ZIO XT  patch monitor.  Your ZIO patch monitor will be mailed 3 day USPS to your address on file. It may take 3-5 days  to receive your monitor after you have been enrolled.  Once you have received your monitor, please review the enclosed instructions. Your monitor  has already been registered assigning a specific monitor serial # to you.  Billing and Patient Assistance Program Information  We have supplied [employer] with any of your insurance information on file for billing purposes. [employer] offers a sliding scale Patient Assistance Program for patients that Record not have  insurance, or whose insurance does not completely cover the cost of the ZIO monitor.  You must apply for the Patient Assistance Program to qualify for this discounted rate.  To apply, please call [employer] at [phone], select option 4, select option 2, ask to apply for  Patient Assistance Program. Theodore Demark will ask your household income, and how many people  are in your household. They will quote your out-of-pocket cost based on that information.  [employer] will also be able to set up a 33-month interest-free payment plan if needed.  Applying the monitor   Shave hair from upper left chest.  Hold abrader disc by orange tab. Rub abrader in 40 strokes over the upper left chest as  indicated in your monitor instructions.  Clean area with 4 enclosed alcohol pads. Let dry.  Apply patch as indicated in monitor instructions. Patch will be placed under collarbone on left  side of chest with arrow pointing upward.  Rub patch adhesive wings for 2 minutes. Remove white label marked "1". Remove the white  label marked "2". Rub patch adhesive wings for 2 additional minutes.  While looking in a mirror, press and release button in center of patch. A small green light will  flash 3-4 times. This will be your only indicator that the monitor has been turned on.  Croslin not shower for the first 24 hours. You may shower after the first 24 hours.  Press  the button if you feel a symptom. You will hear a small click. Record Date, Time and  Symptom in the Patient Logbook.  When you are ready to remove the patch, follow instructions on the last 2 pages of Patient  Logbook. Stick patch monitor onto the last page of Patient Logbook.  Place Patient Logbook in the blue and white box. Use locking tab on box and tape box closed  securely. The blue and white box has prepaid postage on it. Please place it in the mailbox as  soon as possible. Your physician should have your test results approximately 7 days after the  monitor has been mailed back to [facility].  Call [facility] at [phone] if you have questions regarding  your ZIO XT patch monitor. Call them immediately if you see an orange light blinking on your  monitor.  If your monitor falls off in less than 4 days, contact our Monitor department at [phone].  If your monitor becomes loose or falls off after 4 days call [employer] at [phone] for  suggestions on securing your monitor    Follow-Up: At [facility], you and your health needs are our priority.  As part of our continuing mission to provide you with exceptional heart care, we have created designated Provider Care Teams.  These Care Teams include your primary Cardiologist (physician) and Advanced Practice Providers (APPs -  Physician Assistants and Nurse Practitioners) who all work together to provide you with the care you need, when you need it.  We recommend signing up for the patient portal called "MyChart".  Sign up information is provided on this After Visit Summary.  MyChart is used to connect with patients for Virtual Visits (Telemedicine).  Patients are able to view lab/test results, encounter notes, upcoming appointments, etc.  Non-urgent messages can be sent to your provider as well.   To learn more about what you can [place] with MyChart, go to [email or web address].    Your next  appointment:   3-4 month(s)  The format for your next appointment:   In Person  Provider:   Eleonore Chiquito, MD          Signed, Addison Naegeli. Audie Box, MD, [facility]  [address], [place], [place] [address] [phone]  08/22/2022 10:06 AM

## 2022-11-28 ENCOUNTER — Telehealth: Payer: Self-pay | Admitting: Family Medicine

## 2022-11-28 NOTE — Telephone Encounter (Signed)
Noted. FYI

## 2022-11-28 NOTE — Telephone Encounter (Signed)
Leafy Kindle (daughter-in-law) called stating that pt was looking to be seen for upper left arm pain. She stated the pt told her that it felt like a stabbing pain and she wasn't having any other symptoms. After consulting with HG, advised her that pt should be taken to the ED to be evaluated as there may be something else going on. Tawanna Sat acknowledged understanding and stated she would see about getting pt into the ED.

## 2022-12-22 NOTE — Progress Notes (Deleted)
Cardiology Office Note:   Date:  12/22/2022  NAME:  Ariel Williams    MRN: [id number] DOB:  Mar 22, 1941   PCP:  [name], [place]  Cardiologist:  None  Electrophysiologist:  None   Referring MD: Carollee Herter, Alferd Apa, *   No chief complaint on file. ***  History of Present Illness:   Ariel Williams is a 82 y.o. female with a hx of HTN, HLD who presents for follow-up. Seen for palpitations. PVCs noted on EKG but has not completed monitor.   Problem List HTN HLD -T chol 129, HDL 48, LDL 49, TG 158 3. PVCs  Past Medical History: Past Medical History:  Diagnosis Date   Arthritis    H. pylori infection    Hypertension     Past Surgical History: Past Surgical History:  Procedure Laterality Date   CESAREAN SECTION     TUBAL LIGATION      Current Medications: No outpatient medications have been marked as taking for the 12/26/22 encounter (Appointment) with O'Neal, Cassie Freer, MD.     Allergies:    Tramadol hcl   Social History: Social History   Socioeconomic History   Marital status: Married    Spouse name: Not on file   Number of children: 9   Years of education: Not on file   Highest education level: Not on file  Occupational History   Occupation: Retired  Tobacco Use   Smoking status: Never   Smokeless tobacco: Never  Vaping Use   Vaping Use: Never used  Substance and Sexual Activity   Alcohol use: No    Alcohol/week: 0.0 standard drinks of alcohol   Drug use: No   Sexual activity: Not on file  Other Topics Concern   Not on file  Social History Narrative   From [name] been 75 years lives with husband   Social Determinants of Radio broadcast assistant Strain: Not on file  Food Insecurity: Not on file  Transportation Needs: Not on file  Physical Activity: Not on file  Stress: Not on file  Social Connections: Not on file     Family History: The patient's ***family history includes Asthma in her brother.  ROS:   All other ROS reviewed and  negative. Pertinent positives noted in the HPI.     EKGs/Labs/Other Studies Reviewed:   The following studies were personally reviewed by me today:  EKG:  EKG is *** ordered today.  The ekg ordered today demonstrates ***, and was personally reviewed by me.   TTE 08/13/2022  1. Left ventricular ejection fraction, by estimation, is 60 to 65%. The  left ventricle has normal function. The left ventricle has no regional  wall motion abnormalities. Left ventricular diastolic parameters are  consistent with Grade I diastolic  dysfunction (impaired relaxation).   2. Right ventricular systolic function is normal. The right ventricular  size is normal. There is normal pulmonary artery systolic pressure.   3. The mitral valve is normal in structure. No evidence of mitral valve  regurgitation. No evidence of mitral stenosis.   4. The aortic valve is normal in structure. Aortic valve regurgitation is  mild. Aortic valve sclerosis is present, with no evidence of aortic valve  stenosis.   5. The inferior vena cava is normal in size with greater than 50%  respiratory variability, suggesting right atrial pressure of 3 mmHg.   Recent Labs: 08/05/2022: ALT 14; BUN 15; Creatinine, Ser 0.79; Hemoglobin 13.1; Platelets 193.0; Potassium 4.1; Sodium  140; TSH 3.43   Recent Lipid Panel    Component Value Date/Time   CHOL 129 08/05/2022 0934   TRIG 158.0 (H) 08/05/2022 0934   HDL 48.00 08/05/2022 0934   CHOLHDL 3 08/05/2022 0934   VLDL 31.6 08/05/2022 0934   LDLCALC 49 08/05/2022 0934   LDLCALC 142 (H) 01/04/2021 1537   LDLDIRECT 139.0 11/05/2021 1038    Physical Exam:   VS:  There were no vitals taken for this visit.   Wt Readings from Last 3 Encounters:  08/22/22 144 lb (65.3 kg)  08/05/22 144 lb 6.4 oz (65.5 kg)  11/19/21 138 lb 3.2 oz (62.7 kg)    General: Well nourished, well developed, in no acute distress Head: Atraumatic, normal size  Eyes: PEERLA, EOMI  Neck: Supple, no JVD Endocrine:  No thryomegaly Cardiac: Normal S1, S2; RRR; no murmurs, rubs, or gallops Lungs: Clear to auscultation bilaterally, no wheezing, rhonchi or rales  Abd: Soft, nontender, no hepatomegaly  Ext: No edema, pulses 2+ Musculoskeletal: No deformities, BUE and BLE strength normal and equal Skin: Warm and dry, no rashes   Neuro: Alert and oriented to person, place, time, and situation, CNII-XII grossly intact, no focal deficits  Psych: Normal mood and affect   ASSESSMENT:   Ariel Williams is a 82 y.o. female who presents for the following: No diagnosis found.  PLAN:   There are no diagnoses linked to this encounter.  {Are you ordering a CV Procedure (e.g. stress test, cath, DCCV, TEE, etc)?   Press F2        :[id number]  Disposition: No follow-ups on file.  Medication Adjustments/Labs and Tests Ordered: Current medicines are reviewed at length with the patient today.  Concerns regarding medicines are outlined above.  No orders of the defined types were placed in this encounter.  No orders of the defined types were placed in this encounter.   There are no Patient Instructions on file for this visit.   Time Spent with Patient: I have spent a total of *** minutes with patient reviewing hospital notes, telemetry, EKGs, labs and examining the patient as well as establishing an assessment and plan that was discussed with the patient.  > 50% of time was spent in direct patient care.  Signed, Addison Naegeli. Audie Box, MD, [facility]  [address], [place], [place] [address] [phone]  12/22/2022 12:20 PM

## 2022-12-26 ENCOUNTER — Ambulatory Visit: Payer: Medicare Other | Attending: Cardiovascular Disease | Admitting: Cardiovascular Disease

## 2022-12-26 DIAGNOSIS — R002 Palpitations: Secondary | ICD-10-CM

## 2022-12-26 DIAGNOSIS — I493 Ventricular premature depolarization: Secondary | ICD-10-CM

## 2023-02-05 ENCOUNTER — Ambulatory Visit: Payer: Medicare Other | Admitting: Family Medicine

## 2023-02-10 ENCOUNTER — Ambulatory Visit (INDEPENDENT_AMBULATORY_CARE_PROVIDER_SITE_OTHER): Payer: Medicare Other | Admitting: Family Medicine

## 2023-02-10 ENCOUNTER — Encounter: Payer: Self-pay | Admitting: Family Medicine

## 2023-02-10 VITALS — BP 144/88 | HR 77 | Temp 97.4°F | Resp 16 | Ht 60.0 in | Wt 144.4 lb

## 2023-02-10 DIAGNOSIS — I10 Essential (primary) hypertension: Secondary | ICD-10-CM | POA: Diagnosis not present

## 2023-02-10 DIAGNOSIS — E785 Hyperlipidemia, unspecified: Secondary | ICD-10-CM | POA: Diagnosis not present

## 2023-02-10 LAB — COMPREHENSIVE METABOLIC PANEL
ALT: 18 U/L (ref 0–35)
AST: 23 U/L (ref 0–37)
Albumin: 4.7 g/dL (ref 3.5–5.2)
Alkaline Phosphatase: 74 U/L (ref 39–117)
BUN: 15 mg/dL (ref 6–23)
CO2: 27 mEq/L (ref 19–32)
Calcium: 9.4 mg/dL (ref 8.4–10.5)
Chloride: 103 mEq/L (ref 96–112)
Creatinine, Ser: 0.73 mg/dL (ref 0.40–1.20)
GFR: 76.86 mL/min (ref >60.00)
Glucose, Bld: 77 mg/dL (ref 70–99)
Potassium: 4.2 mEq/L (ref 3.5–5.1)
Sodium: 141 mEq/L (ref 135–145)
Total Bilirubin: 0.8 mg/dL (ref 0.2–1.2)
Total Protein: 7.6 g/dL (ref 6.0–8.3)

## 2023-02-10 LAB — CBC WITH DIFFERENTIAL/PLATELET
Basophils Absolute: 0 10*3/uL (ref 0.0–0.1)
Basophils Relative: 0.5 % (ref 0.0–3.0)
Eosinophils Absolute: 0.2 10*3/uL (ref 0.0–0.7)
Eosinophils Relative: 2.3 % (ref 0.0–5.0)
HCT: 41.1 % (ref 36.0–46.0)
Hemoglobin: 13.8 g/dL (ref 12.0–15.0)
Lymphocytes Relative: 39.9 % (ref 12.0–46.0)
Lymphs Abs: 2.7 10*3/uL (ref 0.7–4.0)
MCHC: 33.6 g/dL (ref 30.0–36.0)
MCV: 91.3 fl (ref 78.0–100.0)
Monocytes Absolute: 0.6 10*3/uL (ref 0.1–1.0)
Monocytes Relative: 9.4 % (ref 3.0–12.0)
Neutro Abs: 3.2 10*3/uL (ref 1.4–7.7)
Neutrophils Relative %: 47.9 % (ref 43.0–77.0)
Platelets: 227 10*3/uL (ref 150.0–400.0)
RBC: 4.5 Mil/uL (ref 3.87–5.11)
RDW: 12.8 % (ref 11.5–15.5)
WBC: 6.7 10*3/uL (ref 4.0–10.5)

## 2023-02-10 LAB — TSH: TSH: 3.16 u[IU]/mL (ref 0.35–5.50)

## 2023-02-10 LAB — LIPID PANEL
Cholesterol: 154 mg/dL (ref 0–200)
HDL: 39.7 mg/dL (ref >39.00)
NonHDL: 114.4
Total CHOL/HDL Ratio: 4
Triglycerides: 228 mg/dL — ABNORMAL HIGH (ref 0.0–149.0)
VLDL: 45.6 mg/dL — ABNORMAL HIGH (ref 0.0–40.0)

## 2023-02-10 LAB — LDL CHOLESTEROL, DIRECT: Direct LDL: 83 mg/dL

## 2023-02-10 NOTE — Progress Notes (Signed)
Subjective:   By signing my name below, I, Christoper Allegra, attest that this documentation has been prepared under the direction and in the presence of Donato Schultz, [place]. 02/10/2023   Patient ID: Ariel Williams, female    DOB: 1941-06-13, 82 y.o.   MRN: [id number]  Chief Complaint  Patient presents with   Hypertension   Follow-up    HPI Patient is in today for office visit She has pain that radiates from her knee to her ankle. She continues taking zestoretic and denies having side effects.  Lab Results  Component Value Date   CHOL 154 02/10/2023   HDL 39.70 02/10/2023   LDLCALC 49 08/05/2022   LDLDIRECT 83.0 02/10/2023   TRIG 228.0 (H) 02/10/2023   CHOLHDL 4 02/10/2023     Past Medical History:  Diagnosis Date   Arthritis    H. pylori infection    Hypertension     Past Surgical History:  Procedure Laterality Date   CESAREAN SECTION     TUBAL LIGATION      Family History  Problem Relation Age of Onset   Asthma Brother     Social History   Socioeconomic History   Marital status: Married    Spouse name: Not on file   Number of children: 9   Years of education: Not on file   Highest education level: Not on file  Occupational History   Occupation: Retired  Tobacco Use   Smoking status: Never   Smokeless tobacco: Never  Vaping Use   Vaping Use: Never used  Substance and Sexual Activity   Alcohol use: No    Alcohol/week: 0.0 standard drinks of alcohol   Drug use: No   Sexual activity: Not on file  Other Topics Concern   Not on file  Social History Narrative   From [place] been 30 years lives with husband   Social Determinants of Corporate investment banker Strain: Not on file  Food Insecurity: Not on file  Transportation Needs: Not on file  Physical Activity: Not on file  Stress: Not on file  Social Connections: Not on file  Intimate Partner Violence: Not on file    Outpatient Medications Prior to Visit  Medication Sig Dispense Refill    ammonium lactate (LAC-HYDRIN) 12 % cream Apply topically as needed for dry skin. 385 g 0   lisinopril-hydrochlorothiazide (ZESTORETIC) 20-25 MG tablet Take 1 tablet by mouth daily. 90 tablet 3   meloxicam (MOBIC) 7.5 MG tablet Take 1 tablet (7.5 mg total) by mouth daily. 30 tablet 0   methocarbamol (ROBAXIN) 500 MG tablet Take 1 tablet (500 mg total) by mouth 4 (four) times daily. 45 tablet 0   metoprolol succinate (TOPROL XL) 25 MG 24 hr tablet Take 1 tablet (25 mg total) by mouth daily. 90 tablet 3   Multiple Vitamins-Minerals (WOMENS MULTIVITAMIN PO) Take 1 tablet by mouth daily.     NONFORMULARY OR COMPOUNDED ITEM bp cuff  #1  Dx htn 1 each 0   rosuvastatin (CRESTOR) 40 MG tablet TAKE 1 TABLET(40 MG) BY MOUTH DAILY 90 tablet 3   No facility-administered medications prior to visit.    Allergies  Allergen Reactions   Tramadol Hcl     REACTION: blurred vision    Review of Systems  Constitutional:  Negative for fever and malaise/fatigue.  HENT:  Negative for congestion.   Eyes:  Negative for blurred vision.  Respiratory:  Negative for shortness of breath.   Cardiovascular:  Negative for chest pain, palpitations and leg swelling.  Gastrointestinal:  Negative for abdominal pain, blood in stool and nausea.  Genitourinary:  Negative for dysuria and frequency.  Musculoskeletal:  Positive for joint pain (Knee to ankle). Negative for falls.  Skin:  Negative for rash.  Neurological:  Negative for dizziness, loss of consciousness and headaches.  Endo/Heme/Allergies:  Negative for environmental allergies.  Psychiatric/Behavioral:  Negative for depression. The patient is not nervous/anxious.        Objective:    Physical Exam Vitals and nursing note reviewed.  Constitutional:      Appearance: Normal appearance. She is well-developed.  HENT:     Head: Normocephalic and atraumatic.     Right Ear: External ear normal.     Left Ear: External ear normal.  Eyes:     Extraocular Movements:  Extraocular movements intact.     Conjunctiva/sclera: Conjunctivae normal.     Pupils: Pupils are equal, round, and reactive to light.  Neck:     Thyroid: No thyromegaly.     Vascular: No carotid bruit or JVD.  Cardiovascular:     Rate and Rhythm: Normal rate and regular rhythm.     Heart sounds: Normal heart sounds. No murmur heard.    No gallop.  Pulmonary:     Effort: Pulmonary effort is normal. No respiratory distress.     Breath sounds: Normal breath sounds. No wheezing or rales.  Chest:     Chest wall: No tenderness.  Musculoskeletal:     Cervical back: Normal range of motion and neck supple.  Skin:    General: Skin is warm and dry.  Neurological:     General: No focal deficit present.     Mental Status: She is alert and oriented to person, place, and time.  Psychiatric:        Judgment: Judgment normal.     BP (!) 144/88 (BP Location: Right Arm, Patient Position: Sitting, Cuff Size: Normal)   Pulse 77   Temp (!) 97.4 F (36.3 C) (Oral)   Resp 16   Ht 5' (1.524 m)   Wt 144 lb 6.4 oz (65.5 kg)   SpO2 98%   BMI 28.20 kg/m  Wt Readings from Last 3 Encounters:  02/10/23 144 lb 6.4 oz (65.5 kg)  08/22/22 144 lb (65.3 kg)  08/05/22 144 lb 6.4 oz (65.5 kg)       Assessment & Plan:  Essential hypertension Assessment & Plan: Well controlled, no changes to meds. Encouraged heart healthy diet such as the DASH diet and exercise as tolerated.    Orders: -     CBC with Differential/Platelet -     Comprehensive metabolic panel -     Lipid panel -     TSH  Hyperlipidemia, unspecified hyperlipidemia type -     CBC with Differential/Platelet -     Comprehensive metabolic panel -     Lipid panel -     TSH  Hyperlipidemia LDL goal <100 Assessment & Plan: Encourage heart healthy diet such as MIND or DASH diet, increase exercise, avoid trans fats, simple carbohydrates and processed foods, consider a krill or fish or flaxseed oil cap daily.     Other orders -     LDL  cholesterol, direct    I, Donato SchultzYvonne R Lowne Chase, [place], personally preformed the services described in this documentation.  All medical record entries made by the scribe were at my direction and in my presence.  I have reviewed the chart  and discharge instructions (if applicable) and agree that the record reflects my personal performance and is accurate and complete. 02/10/2023  Donato Schultz, [place]   Dennis Bast Oliver,acting as a scribe for Donato Schultz, [place].,have documented all relevant documentation on the behalf of Donato Schultz, [place],as directed by  Donato Schultz, [place] while in the presence of Donato Schultz, [place].

## 2023-02-10 NOTE — Assessment & Plan Note (Signed)
Well controlled, no changes to meds. Encouraged heart healthy diet such as the DASH diet and exercise as tolerated.  °

## 2023-02-10 NOTE — Assessment & Plan Note (Signed)
Encourage heart healthy diet such as MIND or DASH diet, increase exercise, avoid trans fats, simple carbohydrates and processed foods, consider a krill or fish or flaxseed oil cap daily.  °

## 2023-02-10 NOTE — Patient Instructions (Signed)

## 2023-06-05 IMAGING — DX DG CERVICAL SPINE 2 OR 3 VIEWS
3 series · 3 of 3 positions shown · non-contrast
Comparison: None.

CLINICAL DATA: 80-year-old with neck pain. Posterior neck pain
radiating into the left shoulder for 1 month.

EXAM:
CERVICAL SPINE - 2-3 VIEW

[c-spine lat]
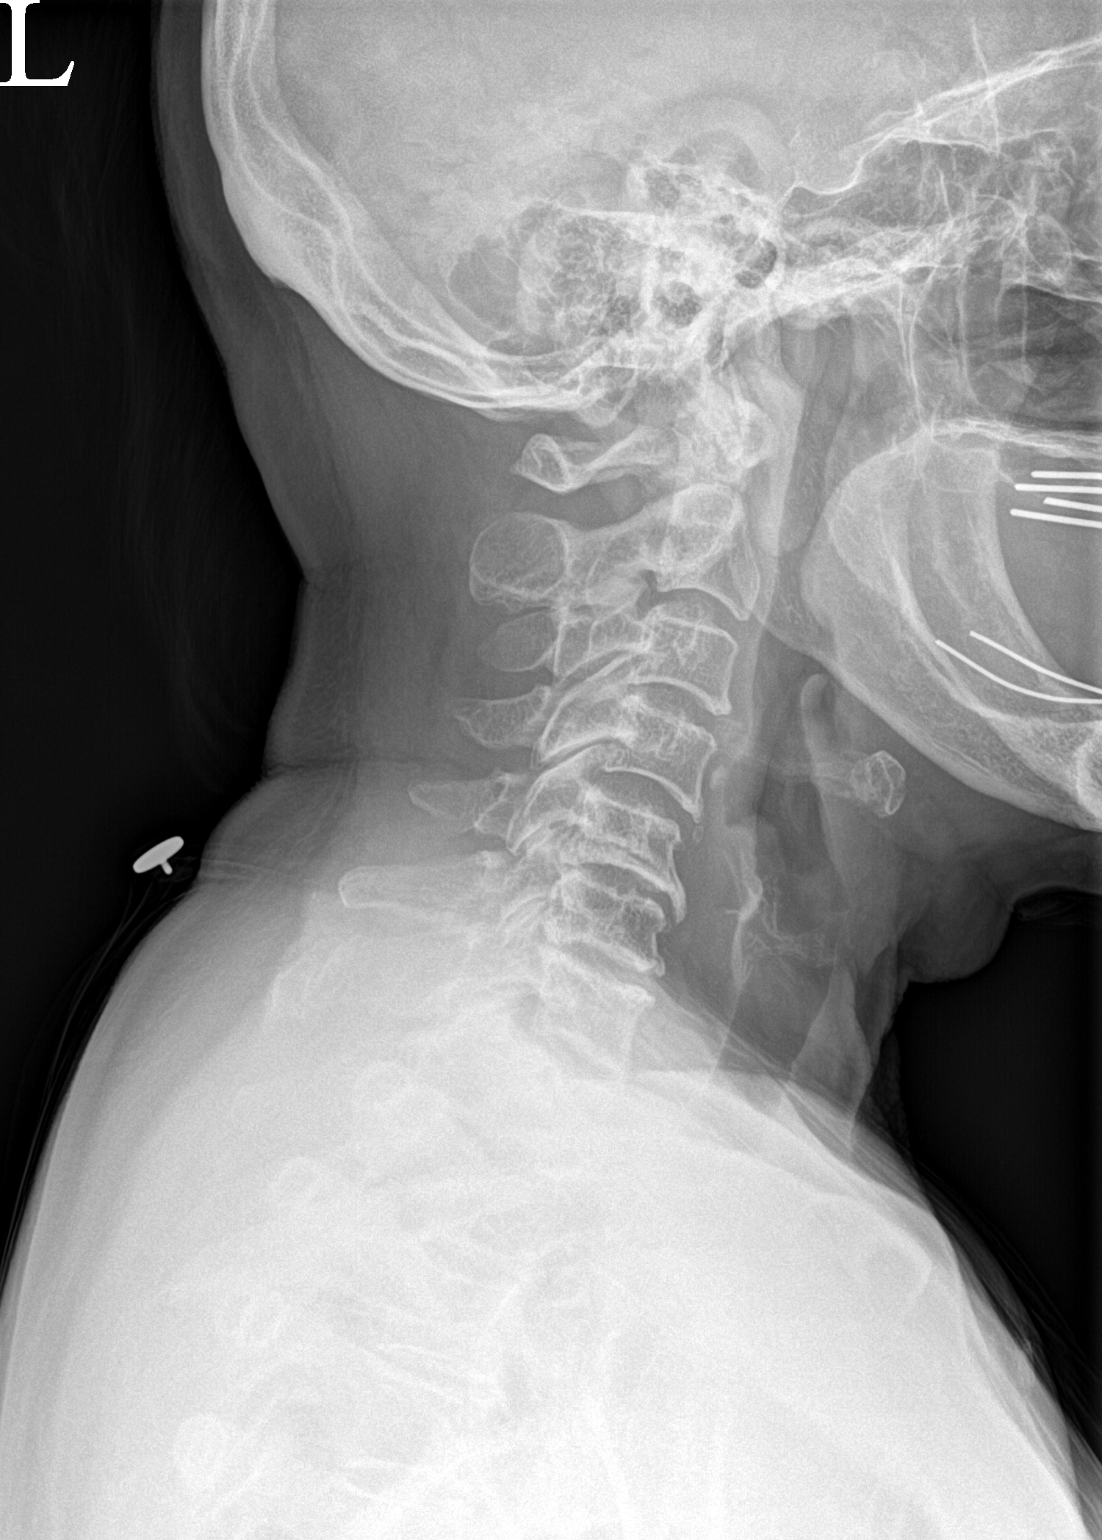

[c-spine ap]
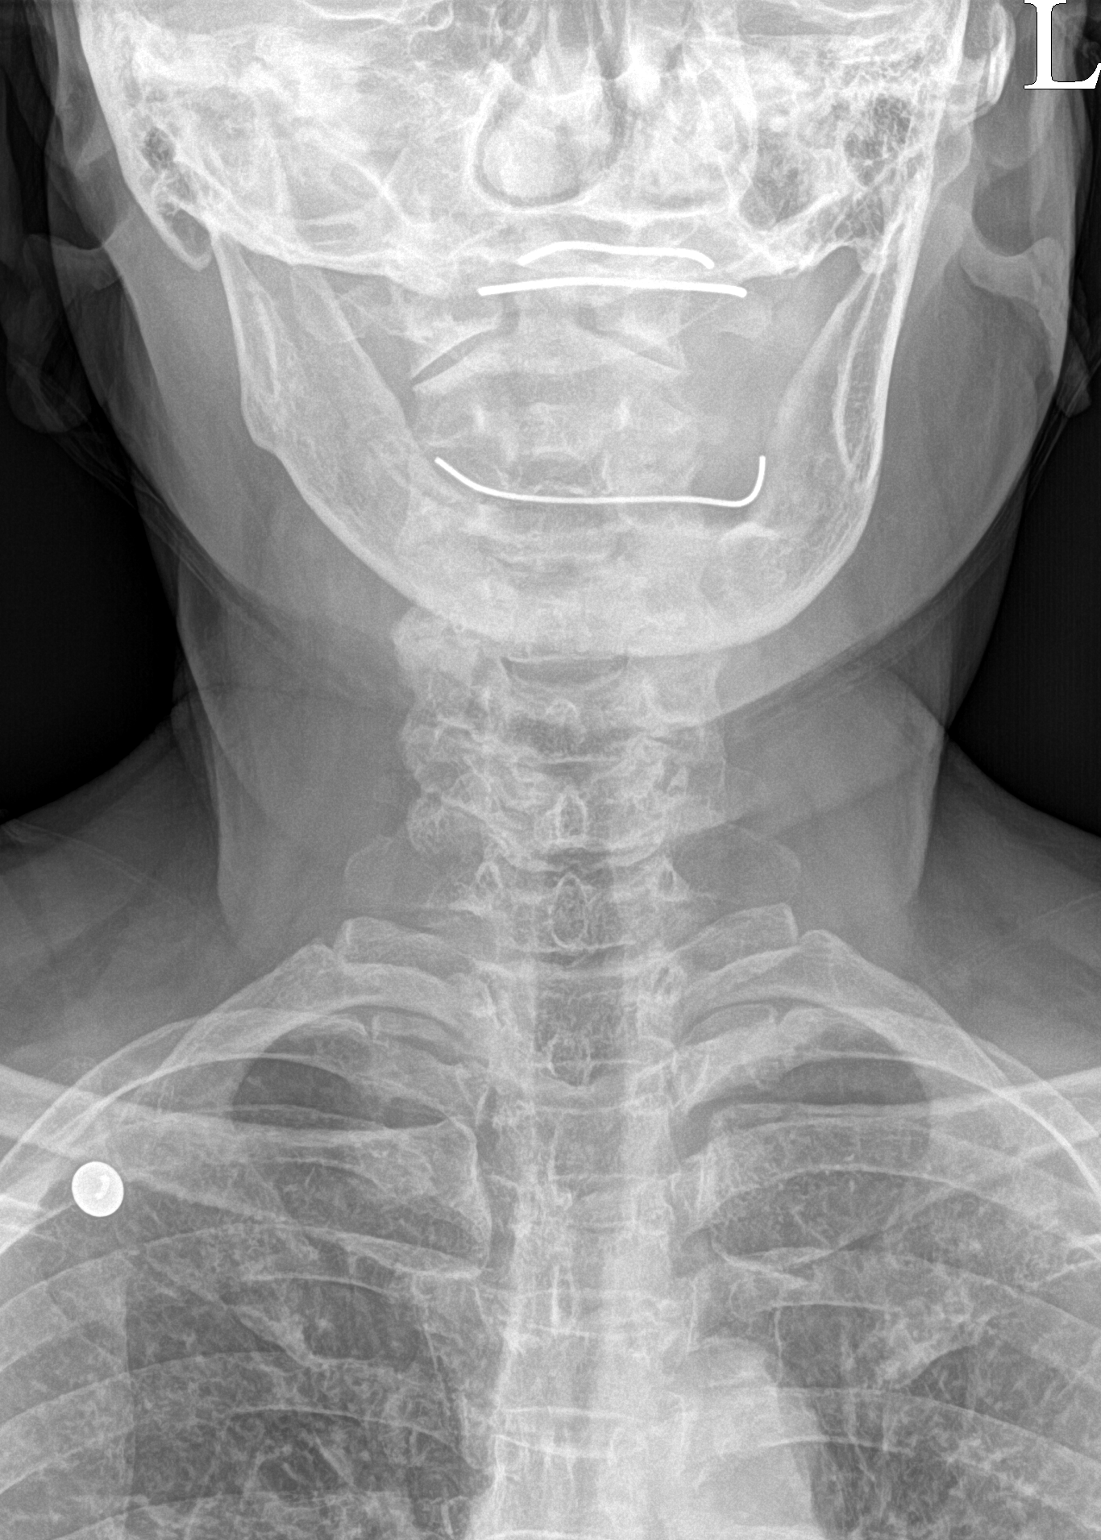

[c-spine open mouth]
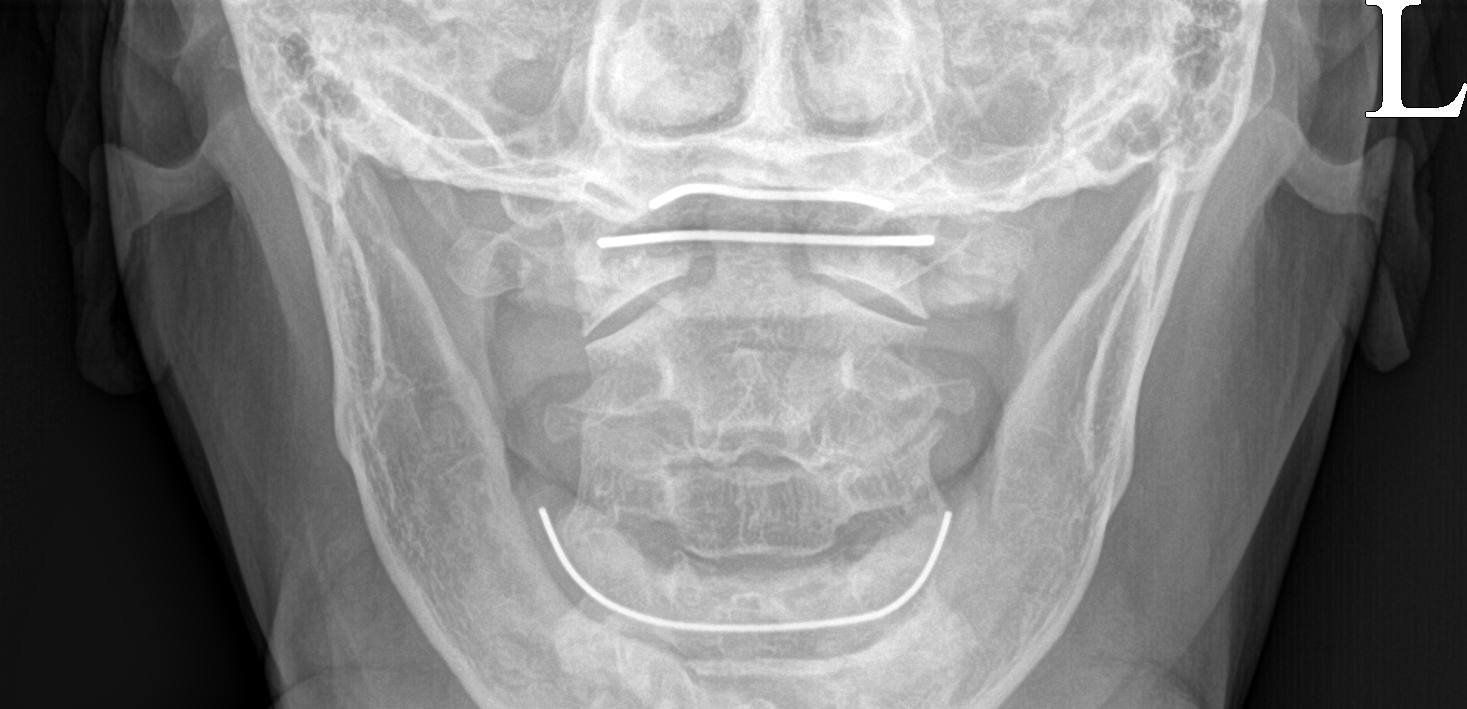

[3 of 3 positions shown; findings below may reference images not displayed]

FINDINGS: Straightening of normal lordosis. Trace anterolisthesis of C4 on C5.
Disc space narrowing and endplate spurring at C5-C6 and C6-C7.
Endplate spurring at C3-C4 and C4-C5 with preservation of disc
spaces. There is multilevel facet hypertrophy. Lateral masses of C1
well aligned on C2. Unfused apophysis versus remote fracture of C7
transverse process. No evidence of acute fracture, bony destruction
or focal bone abnormality. No prevertebral soft tissue edema.
IMPRESSION: 1. Multilevel degenerative disc disease, most prominent at C5-C6 and
C6-C7.
2. Multilevel facet hypertrophy. Trace anterolisthesis of C4 on C5,
likely facet mediated.

## 2023-08-18 ENCOUNTER — Encounter: Payer: Self-pay | Admitting: Family Medicine

## 2023-08-18 ENCOUNTER — Ambulatory Visit (INDEPENDENT_AMBULATORY_CARE_PROVIDER_SITE_OTHER): Payer: Medicare Other | Admitting: Family Medicine

## 2023-08-18 VITALS — BP 140/88 | HR 45 | Temp 97.8°F | Resp 18 | Ht 60.0 in | Wt 142.6 lb

## 2023-08-18 DIAGNOSIS — E785 Hyperlipidemia, unspecified: Secondary | ICD-10-CM | POA: Diagnosis not present

## 2023-08-18 DIAGNOSIS — R001 Bradycardia, unspecified: Secondary | ICD-10-CM | POA: Diagnosis not present

## 2023-08-18 DIAGNOSIS — Z0000 Encounter for general adult medical examination without abnormal findings: Secondary | ICD-10-CM | POA: Diagnosis not present

## 2023-08-18 DIAGNOSIS — R002 Palpitations: Secondary | ICD-10-CM | POA: Diagnosis not present

## 2023-08-18 DIAGNOSIS — I10 Essential (primary) hypertension: Secondary | ICD-10-CM

## 2023-08-18 LAB — LIPID PANEL
Cholesterol: 215 mg/dL — ABNORMAL HIGH (ref 0–200)
HDL: 41.7 mg/dL (ref >39.00)
LDL Cholesterol: 131 mg/dL — ABNORMAL HIGH (ref 0–99)
NonHDL: 172.87
Total CHOL/HDL Ratio: 5
Triglycerides: 210 mg/dL — ABNORMAL HIGH (ref 0.0–149.0)
VLDL: 42 mg/dL — ABNORMAL HIGH (ref 0.0–40.0)

## 2023-08-18 LAB — CBC WITH DIFFERENTIAL/PLATELET
Basophils Absolute: 0 10*3/uL (ref 0.0–0.1)
Basophils Relative: 0.5 % (ref 0.0–3.0)
Eosinophils Absolute: 0.1 10*3/uL (ref 0.0–0.7)
Eosinophils Relative: 1.2 % (ref 0.0–5.0)
HCT: 43.4 % (ref 36.0–46.0)
Hemoglobin: 14.1 g/dL (ref 12.0–15.0)
Lymphocytes Relative: 36.1 % (ref 12.0–46.0)
Lymphs Abs: 2.4 10*3/uL (ref 0.7–4.0)
MCHC: 32.5 g/dL (ref 30.0–36.0)
MCV: 92.7 fL (ref 78.0–100.0)
Monocytes Absolute: 0.5 10*3/uL (ref 0.1–1.0)
Monocytes Relative: 7 % (ref 3.0–12.0)
Neutro Abs: 3.7 10*3/uL (ref 1.4–7.7)
Neutrophils Relative %: 55.2 % (ref 43.0–77.0)
Platelets: 230 10*3/uL (ref 150.0–400.0)
RBC: 4.68 Mil/uL (ref 3.87–5.11)
RDW: 13.1 % (ref 11.5–15.5)
WBC: 6.6 10*3/uL (ref 4.0–10.5)

## 2023-08-18 LAB — COMPREHENSIVE METABOLIC PANEL
ALT: 15 U/L (ref 0–35)
AST: 19 U/L (ref 0–37)
Albumin: 4.5 g/dL (ref 3.5–5.2)
Alkaline Phosphatase: 84 U/L (ref 39–117)
BUN: 13 mg/dL (ref 6–23)
CO2: 27 meq/L (ref 19–32)
Calcium: 9.4 mg/dL (ref 8.4–10.5)
Chloride: 105 meq/L (ref 96–112)
Creatinine, Ser: 0.73 mg/dL (ref 0.40–1.20)
GFR: 76.58 mL/min (ref >60.00)
Glucose, Bld: 99 mg/dL (ref 70–99)
Potassium: 3.9 meq/L (ref 3.5–5.1)
Sodium: 141 meq/L (ref 135–145)
Total Bilirubin: 0.8 mg/dL (ref 0.2–1.2)
Total Protein: 7.6 g/dL (ref 6.0–8.3)

## 2023-08-18 LAB — TSH: TSH: 3.5 u[IU]/mL (ref 0.35–5.50)

## 2023-08-18 MED ORDER — METOPROLOL SUCCINATE ER 25 MG PO TB24
25.0000 mg | ORAL_TABLET | Freq: Every day | ORAL | 3 refills | Status: AC
Start: 2023-08-18 — End: ?

## 2023-08-18 MED ORDER — LISINOPRIL-HYDROCHLOROTHIAZIDE 20-25 MG PO TABS
1.0000 | ORAL_TABLET | Freq: Every day | ORAL | 3 refills | Status: AC
Start: 2023-08-18 — End: ?

## 2023-08-18 MED ORDER — ROSUVASTATIN CALCIUM 40 MG PO TABS
ORAL_TABLET | ORAL | 3 refills | Status: AC
Start: 2023-08-18 — End: ?

## 2023-08-18 NOTE — Progress Notes (Signed)
= v  Established Patient Office Visit  Subjective   Patient ID: Ariel Williams, female    DOB: 27-May-1941  Age: 82 y.o. MRN: [id number]  Chief Complaint  Patient presents with   Annual Exam    Pt states fasting     HPI Discussed the use of AI scribe software for clinical note transcription with the patient, who gave verbal consent to proceed.  History of Present Illness   The patient presents for a routine physical exam and medication refill. She reports occasional fatigue but denies chest pain and shortness of breath. She has been out of both blood pressure medications for a while. She also reports occasional knee soreness but is otherwise active, engaging in regular walking exercises. She has not been taking meloxicam for months. She has already received her flu shot for the year at a pharmacy. She regularly visits the eye doctor and no longer has teeth. translator is present      Patient Active Problem List   Diagnosis Date Noted   Palpitations 08/05/2022   Acute pain of left shoulder 06/11/2021   Numbness and tingling in left arm 01/09/2020   Rash 09/27/2018   Hyperlipidemia LDL goal <100 09/06/2018   Essential hypertension 08/16/2018   Hives 08/16/2018   Preventative health care 08/11/2017   Fatigue 09/11/2015   SINUSITIS - ACUTE-NOS 12/10/2010   CONTACT DERMATITIS&OTH ECZEMA DUE OTH SPEC AGENT 10/07/2010   NECK SPRAIN AND STRAIN 10/07/2010   RESTLESS LEG SYNDROME 11/19/2009   Age-related osteoporosis without current pathological fracture 11/19/2009   CONTACT DERMATITIS&OTHER ECZEMA DUE TO PLANTS 03/22/2009   GERD 11/23/2008   DISUSE OSTEOPOROSIS 11/23/2008   UNSPECIFIED OTITIS MEDIA 11/09/2008   DYSPHAGIA UNSPECIFIED 11/09/2008   SHINGLES, HX OF 01/20/2008   UNS ADVRS EFF UNS RX MEDICINAL&BIOLOGICAL SBSTNC 01/17/2008   POSTMENOPAUSAL STATUS 07/26/2007   Chest pain, unspecified 07/26/2007   Past Medical History:  Diagnosis Date   Arthritis    H. pylori infection     Hypertension    Past Surgical History:  Procedure Laterality Date   CESAREAN SECTION     TUBAL LIGATION     Social History   Tobacco Use   Smoking status: Never   Smokeless tobacco: Never  Vaping Use   Vaping status: Never Used  Substance Use Topics   Alcohol use: No    Alcohol/week: 0.0 standard drinks of alcohol   Drug use: No   Social History   Socioeconomic History   Marital status: Married    Spouse name: Not on file   Number of children: 9   Years of education: Not on file   Highest education level: Not on file  Occupational History   Occupation: Retired  Tobacco Use   Smoking status: Never   Smokeless tobacco: Never  Vaping Use   Vaping status: Never Used  Substance and Sexual Activity   Alcohol use: No    Alcohol/week: 0.0 standard drinks of alcohol   Drug use: No   Sexual activity: Not on file  Other Topics Concern   Not on file  Social History Narrative   From [place] been 30 years lives with husband   Social Determinants of Corporate investment banker Strain: Not on file  Food Insecurity: Not on file  Transportation Needs: Not on file  Physical Activity: Not on file  Stress: Not on file  Social Connections: Not on file  Intimate Partner Violence: Not on file   Family Status  Relation Name Status  Brother  Alive   Mother  Deceased   Father  Deceased   Daughter  Alive  No partnership data on file   Family History  Problem Relation Age of Onset   Asthma Brother    Allergies  Allergen Reactions   Tramadol Hcl     REACTION: blurred vision      ROS    Objective:     BP (!) 140/88 (BP Location: Right Arm, Patient Position: Sitting, Cuff Size: Normal)   Pulse (!) 45   Temp 97.8 F (36.6 C) (Oral)   Resp 18   Ht 5' (1.524 m)   Wt 142 lb 9.6 oz (64.7 kg)   SpO2 98%   BMI 27.85 kg/m  BP Readings from Last 3 Encounters:  08/18/23 (!) 140/88  02/10/23 (!) 144/88  08/22/22 (!) 146/82   Wt Readings from Last 3 Encounters:   08/18/23 142 lb 9.6 oz (64.7 kg)  02/10/23 144 lb 6.4 oz (65.5 kg)  08/22/22 144 lb (65.3 kg)   SpO2 Readings from Last 3 Encounters:  08/18/23 98%  02/10/23 98%  08/22/22 97%      Physical Exam   No results found for any visits on 08/18/23.  Last CBC Lab Results  Component Value Date   WBC 6.7 02/10/2023   HGB 13.8 02/10/2023   HCT 41.1 02/10/2023   MCV 91.3 02/10/2023   MCH 30.5 01/25/2016   RDW 12.8 02/10/2023   PLT 227.0 02/10/2023   Last metabolic panel Lab Results  Component Value Date   GLUCOSE 77 02/10/2023   NA 141 02/10/2023   K 4.2 02/10/2023   CL 103 02/10/2023   CO2 27 02/10/2023   BUN 15 02/10/2023   CREATININE 0.73 02/10/2023   GFR 76.86 02/10/2023   CALCIUM 9.4 02/10/2023   PROT 7.6 02/10/2023   ALBUMIN 4.7 02/10/2023   BILITOT 0.8 02/10/2023   ALKPHOS 74 02/10/2023   AST 23 02/10/2023   ALT 18 02/10/2023   ANIONGAP 10 01/25/2016   Last lipids Lab Results  Component Value Date   CHOL 154 02/10/2023   HDL 39.70 02/10/2023   LDLCALC 49 08/05/2022   LDLDIRECT 83.0 02/10/2023   TRIG 228.0 (H) 02/10/2023   CHOLHDL 4 02/10/2023   Last hemoglobin A1c No results found for: "HGBA1C" Last thyroid functions Lab Results  Component Value Date   TSH 3.16 02/10/2023   T4TOTAL 6.5 08/05/2022   Last vitamin D Lab Results  Component Value Date   VD25OH 25.54 (L) 08/05/2022   Last vitamin B12 and Folate Lab Results  Component Value Date   VITAMINB12 297 08/05/2022      The ASCVD Risk score (Arnett DK, et al., 2019) failed to calculate for the following reasons:   The 2019 ASCVD risk score is only valid for ages 30 to 57    Assessment & Plan:   Problem List Items Addressed This Visit       Unprioritized   Palpitations   Relevant Orders   Ambulatory referral to Cardiology   Preventative health care - Primary    Ghm utd Pt refusing vaccines  See AVS Health Maintenance  Topic Date Due   DTaP/Tdap/Td (1 - Tdap) Never done    Zoster Vaccines- Shingrix (1 of 2) Never done   Medicare Annual Wellness (AWV)  03/02/2014   COVID-19 Vaccine (6 - 2023-24 season) 07/05/2023   INFLUENZA VACCINE  02/01/2024 (Originally 06/04/2023)   MAMMOGRAM  08/17/2024 (Originally 01/09/2021)   Pneumonia Vaccine 57+ Years old  Completed   DEXA SCAN  Completed   HPV VACCINES  Aged Out         Hyperlipidemia LDL goal <100    Encourage heart healthy diet such as MIND or DASH diet, increase exercise, avoid trans fats, simple carbohydrates and processed foods, consider a krill or fish or flaxseed oil cap daily.        Relevant Medications   lisinopril-hydrochlorothiazide (ZESTORETIC) 20-25 MG tablet   metoprolol succinate (TOPROL XL) 25 MG 24 hr tablet   rosuvastatin (CRESTOR) 40 MG tablet   Essential hypertension    Pt has been off bp meds  HR <50 on arrival  EKG done pvc, hr78 Pt missed cardiology f/u--- will reschedule       Relevant Medications   lisinopril-hydrochlorothiazide (ZESTORETIC) 20-25 MG tablet   metoprolol succinate (TOPROL XL) 25 MG 24 hr tablet   rosuvastatin (CRESTOR) 40 MG tablet   Other Relevant Orders   CBC with Differential/Platelet   Comprehensive metabolic panel   Lipid panel   TSH   EKG 12-Lead (Completed)   Other Visit Diagnoses     Hyperlipidemia, unspecified hyperlipidemia type       Relevant Medications   lisinopril-hydrochlorothiazide (ZESTORETIC) 20-25 MG tablet   metoprolol succinate (TOPROL XL) 25 MG 24 hr tablet   rosuvastatin (CRESTOR) 40 MG tablet   Other Relevant Orders   CBC with Differential/Platelet   Comprehensive metabolic panel   Lipid panel   Primary hypertension       Relevant Medications   lisinopril-hydrochlorothiazide (ZESTORETIC) 20-25 MG tablet   metoprolol succinate (TOPROL XL) 25 MG 24 hr tablet   rosuvastatin (CRESTOR) 40 MG tablet   Bradycardia       Relevant Orders   EKG 12-Lead (Completed)     Assessment and Plan    Hypertension Patient has been out of  both blood pressure medications for a while. No reported chest pain or shortness of breath. Noted slow pulse during physical exam. -Refill blood pressure medications. -Order EKG due to slow pulse.  Joint Pain Occasional soreness in the knees. Patient is active with regular walking. -Continue current activity level and monitor symptoms.  General Health Maintenance Patient has already received flu shot this year. Due for tetanus and shingles vaccines, but these need to be administered at the pharmacy. -Advise patient to get tetanus and shingles vaccines at the pharmacy when convenient.  Follow-up Continue regular eye doctor visits. No current dental concerns as patient has no teeth.        Return in about 6 months (around 02/16/2024), or if symptoms worsen or fail to improve.    Donato Schultz, [place]

## 2023-08-18 NOTE — Assessment & Plan Note (Signed)
Encourage heart healthy diet such as MIND or DASH diet, increase exercise, avoid trans fats, simple carbohydrates and processed foods, consider a krill or fish or flaxseed oil cap daily.

## 2023-08-18 NOTE — Assessment & Plan Note (Signed)
Pt has been off bp meds  HR <50 on arrival  EKG done pvc, hr78 Pt missed cardiology f/u--- will reschedule

## 2023-08-18 NOTE — Assessment & Plan Note (Signed)
Ghm utd Pt refusing vaccines  See AVS Health Maintenance  Topic Date Due   DTaP/Tdap/Td (1 - Tdap) Never done   Zoster Vaccines- Shingrix (1 of 2) Never done   Medicare Annual Wellness (AWV)  03/02/2014   COVID-19 Vaccine (6 - 2023-24 season) 07/05/2023   INFLUENZA VACCINE  02/01/2024 (Originally 06/04/2023)   MAMMOGRAM  08/17/2024 (Originally 01/09/2021)   Pneumonia Vaccine 76+ Years old  Completed   DEXA SCAN  Completed   HPV VACCINES  Aged Out

## 2023-08-18 NOTE — Patient Instructions (Signed)
Ariel Williams phng ng?a t? 65 tu?i tr? ln, N? gi?i Preventive Care 63 Years and Older, Female Ariel Williams phng ng?a ?? c?p ??n cc l?a ch?n l?i s?ng v cc l?n khm v?i chuyn gia Ariel Williams s?c kh?e c th? t?ng c??ng s?c kh?e v h?nh phc. Cc l?n khm Ariel Williams phng ng?a cn ???c g?i l khm s?c kh?e. Ti c th? d? ki?n nh?ng g ??i v?i l?n khm Ariel Williams phng ng?a c?a ti? T? v?n Chuyn gia Ariel Williams s?c kh?e c th? h?i qu v? v?: B?nh s?, bao g?m: Cc v?n ?? b?nh l tr??c ?y. B?nh s? gia ?nh. Ti?n s? mang thai v kinh nguy?t. Ti?n s? b? ng. S?c kh?e hi?n t?i, bao g?m: Tr nh? v kh? n?ng hi?u (nh?n th?c). Tnh tr?ng s?c kh?e tinh th?n. Cu?c s?ng ? nh v tnh tr?ng h?nh phc trong m?i quan h?. Quan h? tnh d?c v s?c kh?e tnh d?c. L?i s?ng, bao g?m: S? d?ng r??u, nicotine ho?c thu?c l v ma ty. Ti?p c?n v?i v? kh. Cc thi quen ?n, t?p luy?n v ng?. Cng vi?c v mi tr??ng lm vi?c. S? d?ng kem ch?ng n?ng. Cc v?n ?? an ton nh? th?t dy an ton v ??i m? b?o hi?m xe ??p. Khm th?c th? Chuyn gia Ariel [place] s?c kh?e s? ki?m tra qu v? v?: Chi?u cao v cn n?ng. Nh?ng thng s? ny c th? ???c s? d?ng ?? tnh ton BMI (ch? s? kh?i c? th?) c?a qu v?. BMI l m?t s? ?o cho bi?t qu v? c cn n?ng t?t cho s?c kh?e hay khng. Vng eo. ?y l ?o vng eo c?a qu v?. Php ?o ny c?ng cho bi?t li?u qu v? c cn n?ng t?t cho s?c kh?e khng v c th? gip d? ?on nguy c? m?c m?t s? b?nh, ch?ng h?n nh? ti?u ???ng tup 2 v huy?t p cao. Nh?p tim v huy?t p. Thn nhi?t. Da xem c cc ??m b?t th??ng hay khng. Ti c?n ch?ng ng?a nh?ng g?  V?c xin th??ng ???c tim ? cc ?? tu?i khc nhau, theo l?ch. Chuyn gia Ariel Williams s?c kh?e s? khuy?n ngh? cc lo?i v?c xin dnh cho qu v? d?a trn tu?i, b?nh s? v l?i s?ng ho?c cc y?u t? khc, ch?ng h?n nh? ?i l?i ho?c n?i qu v? lm vi?c. Ti c?n lm cc xt nghi?m no? Sng l?c Chuyn gia Ariel Williams s?c kh?e c th? khuy?n ngh? cc ki?m tra sng l?c cho m?t  s? tnh tr?ng nh?t ??nh. Vi?c ny c th? bao g?m: N?ng ?? lipid v cholesterol. Xt nghi?m vim gan C. Xt nghi?m vim gan B. Xt nghi?m HIV (vi rt gy suy gi?m mi?n d?ch ? ng??i). Xt nghi?m STI (b?nh nhi?m trng ly truy?n qua ???ng tnh d?c), n?u qu v? c nguy c?. Sng l?c ung th? ph?i. Sng l?c ung th? ??i tr?c trng. Sng l?c ti?u ???ng. Vi?c ny ???c th?c hi?n b?ng cch ki?m tra ???ng huy?t (glucose) sau khi qu v? khng ?n g trong m?t kho?ng th?i gian (nh?n ?i). Ch?p X quang tuy?n v. Hy trao ??i v?i chuyn gia Ariel Williams s?c kh?e v? vi?c qu v? nn ch?p x-quang tuy?n v ??nh k? bao lu m?t l?n. Sng l?c ung th? lin quan ??n BRCA. Xt nghi?m ny c th? ???c th?c hi?n n?u qu v? c ti?n s? gia ?nh b? ung th? v, ung th? bu?ng tr?ng, ung th? ?ng d?n tr?ng ho?c ung th?  phc m?c. Ch?p m?t ?? x??ng. Ki?m tra ny ???c th?c hi?n ?? sng l?c b?nh long x??ng. Trao ??i v?i chuyn gia Ariel Williams s?c kh?e cu?a qu v? v? cc k?t qu? xt nghi?m, cc l?a ch?n ?i?u tr? v n?u c?n, nhu c?u lm thm cc ki?m tra. Tun th? nh?ng h??ng d?n ny ? nh: ?n v u?ng  ?n ch? ?? ?n bao g?m tri cy v rau c? t??i, ng? c?c nguyn h?t, protein t? th?t n?c v cc s?n ph?m t? s?a t ch?t bo. H?n ch? l??ng th?c ph?m c l??ng ???ng cao, ch?t bo bo ha v mu?i. Dng th?c ph?m ch?c n?ng c vitamin v ch?t khong theo khuy?n ngh? c?a chuyn gia Ariel Williams s?c kh?e. Khng u?ng r??u n?u chuyn gia Ariel Williams s?c kh?e c?a qu v? [place] qu v? khng u?ng r??u. N?u qu v? u?ng r??u: Gi?i h?n l??ng r??u qu v? u?ng ? 0-1 ly m?i ngy. Bi?t m?t ly c bao nhiu r??u. ? M?, m?t ly t??ng ???ng v?i m?t chai bia 12 ao x? (355 mL), m?t ly r??u vang 5 ao x? (148 mL), ho?c m?t ly r??u m?nh 1 ao x? (44 mL). L?i s?ng ?nh r?ng m?i sng v t?i b?ng kem ?nh r?ng c flo. Dng ch? nha khoa lm s?ch k? r?ng m?i ngy m?t l?n. T?p th? d?c t nh?t 30 pht, t? 5 ngy tr? ln m?i tu?n. Khng s? d?ng b?t k? s?n ph?m no c nicotine ho?c thu?c  l. Nh?ng s?n ph?m ny bao g?m thu?c l d?ng ht, thu?c l d?ng nhai v d?ng c? ht thu?c, ch?ng h?n nh? thu?c l ?i?n t?. N?u qu v? c?n gip ?? ?? cai thu?c, hy h?i chuyn gia Ariel Williams s?c kh?e. Khng s? d?ng ma ty. N?u qu v? c quan h? tnh d?c, hy th?c hnh quan h? tnh d?c an ton. S? d?ng bao cao su ho?c hnh th?c b?o v? khc ?? phng ng?a STI. Ch? s? d?ng aspirin theo ch? d?n c?a chuyn gia Ariel Williams s?c kh?e cu?a quy? vi?. Hy ch?c ch?n r?ng qu v? hi?u r c?n dng bao nhiu v dng d?ng no. Lm vi?c v?i chuyn gia Ariel Williams s?c kh?e ?? tm hi?u xem vi?c dng aspirin hng ngy c an ton v c l?i cho qu v? hay khng. Hy h?i chuyn gia Ariel Williams s?c kh?e xem qu v? c c?n dng thu?c h? cholesterol (statin) hay khng. Tm cc cch lnh m?nh ?? qu?n l c?ng th?ng, ch?ng h?n nh?: Thi?n, yoga, ho?c nghe nh?c. Vi?t nh?t k. Ni chuy?n v?i m?t ng??i ?ng tin c?y. Dnh th?i gian cho b?n b v gia ?nh. Gi?m thi?u ti?p xc v?i b?c x? UV ?? gi?m nguy c? ung th? da. An ton Lun th?t dy an ton trong khi li xe ho?c ng?i trn ph??ng ti?n ?i l?i. Khng li xe: N?u qu v? ? u?ng r??u. Khng ?i xe cng v?i m?t ng??i v?a u?ng r??u. Khi qu v? m?t m?i ho?c r?i tr. Trong khi nh?n tin. N?u qu v? ? s? d?ng b?t k? ch?t lm thay ??i tm tr ho?c ma ty. ??i m? b?o hi?m v cc ?? b?o v? khc trong khi tham gia ho?t ??ng th? thao. N?u qu v? c v? kh trong nh, hy ??m b?o qu v? lm theo t?t c? cc quy trnh an ton v?i sng. C?n lm g ti?p theo? ??n khm v?i chuyn gia Ariel Williams s?c kh?e m?i n?m m?t l?n ??  ki?m tra s?c kh?e th??ng nin. Hy h?i chuyn gia Ariel Williams s?c kh?e v? vi?c qu v? nn khm m?t v r?ng bao lu m?t l?n. Tim v?c xin ??y ??Ariel Williams tin ny khng nh?m m?c ?ch thay th? cho l?i khuyn m chuyn gia Ariel Williams s?c kh?e ni v?i qu v?. Hy b?o ??m qu v? ph?i th?o lu?n b?t k? v?n ?? g m qu v? c v?i chuyn gia Ariel Williams s?c kh?e c?a qu v?. Document Revised: 05/23/2021 Document  Reviewed: 05/23/2021 Elsevier Patient Education  2024 Ariel Williams.

## 2024-04-08 ENCOUNTER — Telehealth: Payer: Self-pay | Admitting: Family Medicine

## 2024-04-08 NOTE — Telephone Encounter (Signed)
 Copied from CRM [id number]. Topic: Medicare AWV >> Apr 08, 2024 10:57 AM Juliana Ocean wrote: Reason for CRM: LVM 04/07/2024 to schedule AWV. Please schedule Virtual or Telehealth visits ONLY  Rosalee Collins; Care Guide Ambulatory Clinical Support [facility] Direct Dial: [phone]

## 2024-09-06 ENCOUNTER — Telehealth: Payer: Self-pay

## 2024-09-06 NOTE — Telephone Encounter (Signed)
 Called using interpreter services. The patient hung up while interpreter was introducing themselves. We tried twice.
